# Patient Record
Sex: Male | Born: 1960 | Race: White | Hispanic: No | Marital: Married | Smoking: Never smoker
Health system: Southern US, Community
[De-identification: ages and names within clinical notes are randomized; demographics above are authoritative.]

## PROBLEM LIST (undated history)

## (undated) DIAGNOSIS — R059 Cough, unspecified: Secondary | ICD-10-CM

## (undated) DIAGNOSIS — J309 Allergic rhinitis, unspecified: Secondary | ICD-10-CM

## (undated) DIAGNOSIS — R05 Cough: Secondary | ICD-10-CM

## (undated) DIAGNOSIS — F32A Depression, unspecified: Secondary | ICD-10-CM

## (undated) DIAGNOSIS — E785 Hyperlipidemia, unspecified: Secondary | ICD-10-CM

## (undated) DIAGNOSIS — M5020 Other cervical disc displacement, unspecified cervical region: Secondary | ICD-10-CM

## (undated) DIAGNOSIS — F329 Major depressive disorder, single episode, unspecified: Secondary | ICD-10-CM

## (undated) DIAGNOSIS — R0981 Nasal congestion: Secondary | ICD-10-CM

## (undated) DIAGNOSIS — F4010 Social phobia, unspecified: Secondary | ICD-10-CM

## (undated) HISTORY — DX: Hyperlipidemia, unspecified: E78.5

## (undated) HISTORY — DX: Cough: R05

## (undated) HISTORY — DX: Social phobia, unspecified: F40.10

## (undated) HISTORY — DX: Allergic rhinitis, unspecified: J30.9

## (undated) HISTORY — DX: Cough, unspecified: R05.9

## (undated) HISTORY — DX: Nasal congestion: R09.81

---
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
## 1988-06-26 HISTORY — PX: OTHER SURGICAL HISTORY: SHX169

## 1998-06-26 HISTORY — PX: OTHER SURGICAL HISTORY: SHX169

## 1998-07-22 ENCOUNTER — Ambulatory Visit (HOSPITAL_BASED_OUTPATIENT_CLINIC_OR_DEPARTMENT_OTHER): Admission: RE | Admit: 1998-07-22 | Discharge: 1998-07-22 | Payer: Self-pay | Admitting: Orthopedic Surgery

## 2002-06-05 ENCOUNTER — Ambulatory Visit (HOSPITAL_BASED_OUTPATIENT_CLINIC_OR_DEPARTMENT_OTHER): Admission: RE | Admit: 2002-06-05 | Discharge: 2002-06-05 | Payer: Self-pay | Admitting: Plastic Surgery

## 2011-03-12 ENCOUNTER — Emergency Department (HOSPITAL_COMMUNITY)
Admission: EM | Admit: 2011-03-12 | Discharge: 2011-03-12 | Disposition: A | Discharge status: Home / Self Care | Payer: 59 | Attending: Emergency Medicine | Admitting: Emergency Medicine

## 2011-03-12 DIAGNOSIS — M25519 Pain in unspecified shoulder: Secondary | ICD-10-CM | POA: Insufficient documentation

## 2011-03-12 DIAGNOSIS — R51 Headache: Secondary | ICD-10-CM | POA: Insufficient documentation

## 2011-03-12 LAB — POCT I-STAT TROPONIN I: Troponin i, poc: 0 ng/mL (ref 0.00–0.08)

## 2011-08-21 ENCOUNTER — Ambulatory Visit (INDEPENDENT_AMBULATORY_CARE_PROVIDER_SITE_OTHER): Payer: 59 | Admitting: General Surgery

## 2011-08-21 ENCOUNTER — Encounter (INDEPENDENT_AMBULATORY_CARE_PROVIDER_SITE_OTHER): Payer: Self-pay | Admitting: General Surgery

## 2011-08-21 VITALS — BP 130/72 | HR 70 | Temp 98.1°F | Resp 18 | Ht 71.0 in | Wt 198.0 lb

## 2011-08-21 DIAGNOSIS — R1031 Right lower quadrant pain: Secondary | ICD-10-CM

## 2011-08-21 NOTE — Progress Notes (Signed)
Patient ID: Adam Meyers, male   DOB: 1960/12/11, 51 y.o.   MRN: [id number]  Chief Complaint  Patient presents with  . Follow-up    Inguinal hernia   HPI He was referred by Dr. Bjorn Pippin  for evaluation of right groin pain, suspected inguinal hernia.  The patient has no prior history of hernia. He had epididymitis in the remote past. He had a vasectomy about 5 years ago. He said he says a chronic right scrotal pain since that time.  For the past 2 weeks he's had an increasing amount of pain in his right testicle and right inguinal area. No nausea or vomiting. Denies feeling a bulge pain,o voiding symptoms. One week ago he lifted a heavy box and the pain got worse. The pain bothers him when he walks in the neighborhood. Otherwise there are no aggravating or alleviating factors.  He saw Bjorn Pippin today and he thought that he had a small right inguinal hernia and that he was exquisitely tender. He felt that there was no evidence of epididymitis or  bacterial infection in the urologic tract. HPI  Past Medical History  Diagnosis Date  . Inguinal hernia   . Hyperlipidemia   . Nasal congestion   . Cough     Past Surgical History  Procedure Date  . Tonsillectomy 1970  . Left knee surgery 1990    arthroscopic  . Bone spur removal 2000    right shoulder    Family History  Problem Relation Age of Onset  . Cancer Maternal Grandmother     melanoma    Social History History  Substance Use Topics  . Smoking status: Never Smoker   . Smokeless tobacco: Never Used  . Alcohol Use: No    Allergies  Allergen Reactions  . Percocet (Oxycodone-Acetaminophen) Anxiety    Current Outpatient Prescriptions  Medication Sig Dispense Refill  . acetaminophen (TYLENOL) 500 MG tablet Take 1,000 mg by mouth as needed.      Marland Kitchen azelastine (ASTELIN) 137 MCG/SPRAY nasal spray Place 1 spray into the nose as needed. Use in each nostril as directed      . ibuprofen (ADVIL) 200 MG tablet Take 600 mg by  mouth as needed.      . mometasone (NASONEX) 50 MCG/ACT nasal spray Place 2 sprays into the nose as needed.        Review of Systems Review of Systems  Constitutional: Negative for fever, chills and unexpected weight change.  HENT: Negative for hearing loss, congestion, sore throat, trouble swallowing and voice change.   Eyes: Negative for visual disturbance.  Respiratory: Negative for cough and wheezing.   Cardiovascular: Negative for chest pain, palpitations and leg swelling.  Gastrointestinal: Negative for nausea, vomiting, abdominal pain, diarrhea, constipation, blood in stool, abdominal distention, anal bleeding and rectal pain.  Genitourinary: Positive for testicular pain. Negative for urgency, hematuria, flank pain, decreased urine volume, discharge, penile swelling, scrotal swelling, enuresis, difficulty urinating, genital sores and penile pain.  Musculoskeletal: Negative for arthralgias.  Skin: Negative for rash and wound.  Neurological: Negative for seizures, syncope, weakness and headaches.  Hematological: Negative for adenopathy. Does not bruise/bleed easily.  Psychiatric/Behavioral: Negative for confusion.    Blood pressure 130/72, pulse 70, temperature 98.1 F (36.7 C), temperature source Temporal, resp. rate 18, height 5\' 11"  (1.803 m), weight 198 lb (89.812 kg).  Physical Exam Physical Exam  Constitutional: He is oriented to person, place, and time. He appears well-developed and well-nourished. No distress.  Pleasant. Cooperative. Intelligent. Wife is with him.  HENT:  Head: Normocephalic.  Nose: Nose normal.  Mouth/Throat: No oropharyngeal exudate.  Eyes: Conjunctivae and EOM are normal. Pupils are equal, round, and reactive to light. Right eye exhibits no discharge. Left eye exhibits no discharge. No scleral icterus.  Neck: Normal range of motion. Neck supple. No JVD present. No tracheal deviation present. No thyromegaly present.  Cardiovascular: Normal rate,  regular rhythm, normal heart sounds and intact distal pulses.   No murmur heard. Pulmonary/Chest: Effort normal and breath sounds normal. No stridor. No respiratory distress. He has no wheezes. He has no rales. He exhibits no tenderness.  Abdominal: Soft. Bowel sounds are normal. He exhibits no distension and no mass. There is no tenderness. There is no rebound and no guarding.  Genitourinary: Penis normal. No penile tenderness.       Examined supine and standing. I cannot demonstrate a hernia bulge but he is very tender to examine internally in the inguinal canal. No adenopathy. No skin change. Penis scrotum and testes are normal.  Musculoskeletal: Normal range of motion. He exhibits no edema and no tenderness.  Lymphadenopathy:    He has no cervical adenopathy.  Neurological: He is alert and oriented to person, place, and time. He has normal reflexes. Coordination normal.  Skin: Skin is warm and dry. No rash noted. He is not diaphoretic. No erythema. No pallor.  Psychiatric: He has a normal mood and affect. His behavior is normal. Judgment and thought content normal.    Data Reviewed I had two phone conversations with Dr. Annabell Howells. I reviewed Dr. Belva Crome office notes and lab tests.  Assessment    Right groin pain, recent onset and exacerbation superimposed on chronic right scrotal pain. This is most likely a musculoskeletal strain, although I cannot completely rule out an occult inguinal hernia.  His tenderness is out of proportion to the physical findings, and this suggests a musculoskeletal etiology.  He does not appear to be acutely ill.    Plan    Initially, we are going to treat this as a musculoskeletal strain. No sports or heavy lifting for 3 weeks. Nonsteroidal medication every 12 hours. Ice packs.  Return to see me in 3 weeks for repeat exam to make sure we have not missed a small hernia.  I do not think there is any indication for CT scanning at this time, although if  symptoms persist that may become necessary.   Angelia Mould. Derrell Lolling, M.D., [facility] General and Minimally invasive Surgery Breast and Colorectal Surgery Office:   [phone] Pager:   [phone]         08/21/2011, 4:45 PM

## 2011-08-21 NOTE — Patient Instructions (Signed)
You have pain in your right groin for the past 2 weeks, but I cannot clearly demonstrat a hernia. Dr. Annabell Howells  and I do not believe you have an infection. It is possible you just have a musculoskeletal strain.  I advise you to take 2 Aleve tablets twice a day, and use an ice pack on the right groin intermittently, and stop all heavy lifting, sports or exercise in the gym for 2-3 weeks.  Return to see Dr. Derrell Lolling in 3 weeks to make sure that you have not developed a hernia.

## 2011-08-28 ENCOUNTER — Encounter (INDEPENDENT_AMBULATORY_CARE_PROVIDER_SITE_OTHER): Payer: 59 | Admitting: Surgery

## 2011-09-05 ENCOUNTER — Telehealth (INDEPENDENT_AMBULATORY_CARE_PROVIDER_SITE_OTHER): Payer: Self-pay

## 2011-09-05 NOTE — Telephone Encounter (Signed)
Pt called stating he bent over to pick up soda from floor and had pain run down leg into foot. Pt states this discomfort kept him from sleeping last night. Pt states he does not have bulge or change in groin that he was examined for by Dr Derrell Lolling. Pt has seen an [place] [facility] in resent past. Pt advised to see his orthopedic md to rule out any back injury. Pt states he will.

## 2011-09-12 ENCOUNTER — Ambulatory Visit
Admission: RE | Admit: 2011-09-12 | Discharge: 2011-09-12 | Disposition: A | Discharge status: Home / Self Care | Payer: 59 | Source: Ambulatory Visit | Attending: Family Medicine | Admitting: Family Medicine

## 2011-09-12 ENCOUNTER — Other Ambulatory Visit: Payer: Self-pay | Admitting: Family Medicine

## 2011-09-12 DIAGNOSIS — R1030 Lower abdominal pain, unspecified: Secondary | ICD-10-CM

## 2011-09-12 MED ORDER — IOHEXOL 300 MG/ML  SOLN
100.0000 mL | Freq: Once | INTRAMUSCULAR | Status: AC | PRN
  Administered 2011-09-12: 100 mL via INTRAVENOUS

## 2011-09-13 ENCOUNTER — Other Ambulatory Visit: Payer: Self-pay | Admitting: Family Medicine

## 2011-09-13 DIAGNOSIS — R1030 Lower abdominal pain, unspecified: Secondary | ICD-10-CM

## 2011-09-14 ENCOUNTER — Other Ambulatory Visit: Payer: Self-pay | Admitting: Family Medicine

## 2011-09-14 ENCOUNTER — Encounter (INDEPENDENT_AMBULATORY_CARE_PROVIDER_SITE_OTHER): Payer: 59 | Admitting: General Surgery

## 2011-09-14 DIAGNOSIS — M5416 Radiculopathy, lumbar region: Secondary | ICD-10-CM

## 2011-09-20 ENCOUNTER — Ambulatory Visit
Admission: RE | Admit: 2011-09-20 | Discharge: 2011-09-20 | Disposition: A | Discharge status: Home / Self Care | Payer: 59 | Source: Ambulatory Visit | Attending: Family Medicine | Admitting: Family Medicine

## 2011-09-20 DIAGNOSIS — M5416 Radiculopathy, lumbar region: Secondary | ICD-10-CM

## 2011-10-02 ENCOUNTER — Encounter (INDEPENDENT_AMBULATORY_CARE_PROVIDER_SITE_OTHER): Payer: 59 | Admitting: General Surgery

## 2012-04-15 ENCOUNTER — Other Ambulatory Visit: Payer: Self-pay | Admitting: Family Medicine

## 2012-04-15 DIAGNOSIS — G44009 Cluster headache syndrome, unspecified, not intractable: Secondary | ICD-10-CM

## 2012-04-15 DIAGNOSIS — M542 Cervicalgia: Secondary | ICD-10-CM

## 2012-04-18 ENCOUNTER — Other Ambulatory Visit: Payer: Self-pay | Admitting: Gastroenterology

## 2012-04-18 DIAGNOSIS — R1310 Dysphagia, unspecified: Secondary | ICD-10-CM

## 2012-04-23 ENCOUNTER — Other Ambulatory Visit: Payer: 59

## 2012-04-24 ENCOUNTER — Other Ambulatory Visit: Payer: 59

## 2012-04-27 ENCOUNTER — Other Ambulatory Visit: Payer: 59

## 2012-05-01 ENCOUNTER — Other Ambulatory Visit (HOSPITAL_COMMUNITY): Payer: 59

## 2012-05-02 ENCOUNTER — Ambulatory Visit
Admission: RE | Admit: 2012-05-02 | Discharge: 2012-05-02 | Disposition: A | Discharge status: Home / Self Care | Payer: 59 | Source: Ambulatory Visit | Attending: Gastroenterology | Admitting: Gastroenterology

## 2012-05-02 ENCOUNTER — Inpatient Hospital Stay: Admission: RE | Admit: 2012-05-02 | Payer: 59 | Source: Ambulatory Visit

## 2012-05-02 ENCOUNTER — Other Ambulatory Visit: Payer: 59

## 2012-05-02 DIAGNOSIS — R1310 Dysphagia, unspecified: Secondary | ICD-10-CM

## 2012-05-05 ENCOUNTER — Ambulatory Visit
Admission: RE | Admit: 2012-05-05 | Discharge: 2012-05-05 | Disposition: A | Discharge status: Home / Self Care | Payer: 59 | Source: Ambulatory Visit | Attending: Family Medicine | Admitting: Family Medicine

## 2012-05-05 DIAGNOSIS — IMO0001 Reserved for inherently not codable concepts without codable children: Secondary | ICD-10-CM

## 2012-05-05 DIAGNOSIS — G44009 Cluster headache syndrome, unspecified, not intractable: Secondary | ICD-10-CM

## 2012-05-05 DIAGNOSIS — M542 Cervicalgia: Secondary | ICD-10-CM

## 2012-05-07 ENCOUNTER — Other Ambulatory Visit: Payer: 59

## 2014-08-26 ENCOUNTER — Ambulatory Visit: Payer: Self-pay | Admitting: Podiatry

## 2014-09-21 ENCOUNTER — Ambulatory Visit: Payer: Self-pay | Admitting: Podiatry

## 2014-10-19 ENCOUNTER — Ambulatory Visit (INDEPENDENT_AMBULATORY_CARE_PROVIDER_SITE_OTHER): Payer: 59 | Admitting: Podiatry

## 2014-10-19 DIAGNOSIS — B351 Tinea unguium: Secondary | ICD-10-CM | POA: Diagnosis not present

## 2014-10-19 NOTE — Patient Instructions (Signed)
If the sensitivity increases or moves to additional locations present for further evaluation The left great toenail has deformity with a fungal infection, however, most likely is not the cause of your sensitivity

## 2014-10-19 NOTE — Progress Notes (Signed)
   Subjective:    Patient ID: Adam Meyers, male    DOB: 07/07/1960, 54 y.o.   MRN: [id number]  HPI  N-partial numbness and sensitive on top  L-left great toe D-  2 years ago O-after nail removal  C- same A-pain with the pressure of water hitting it in shower T-none  "left great toe numbness,and very sensitive, feeling of a bandage on it" Patient denies any other areas of local numbness other than the left distal hallux area. The symptoms have not progressed over 2 year.    Patient states that he is recovering from severe depression   Review of Systems  HENT:       Ringing in ears  Eyes: Positive for visual disturbance.  Gastrointestinal: Positive for constipation.  Genitourinary:       Increased urination  Musculoskeletal: Positive for back pain.       Joint pain   Allergic/Immunologic: Positive for environmental allergies.  Psychiatric/Behavioral: The patient is nervous/anxious.        Objective:   Physical Exam  Orientated 3  Vascular: DP and PT pulses 2/4 bilaterally Capillary reflex immediate bilaterally  Neurological: Sensation to 10 g monofilament wire intact 5/5 bilaterally Vibratory sensation intact bilaterally Ankle reflex equal and reactive bilaterally  Dermatological: The left hallux nail has a dystrophic, yellow, hypertrophic nail plate. The remaining toenails have occasional texture and color changes  Musculoskeletal: Manual motor testing: Dorsi flexion, plantar flexion, inversion, eversion 5/5 bilaterally Stable gait There is no restriction ankle, subtalar, midtarsal joints bilaterally      Assessment & Plan:   Assessment: Neurovascular status intact bilaterally There is no evidence of a progressive neurological deficit at this time Mycotic left hallux toenail  Plan: I reviewed the results of the examination with the patient today and made them aware that there was no obvious neurological deficit at this time. The symptoms have not  progressed over 2 year. I advised patient to monitor the symptoms and of the progressed to return for further evaluation and possible referral to a neurologist.  I made patient aware that the left hallux nail was most likely mycotic and made him aware of treatment options including no treatment, debridement, oral medication topical medications  Reappoint at patient's request

## 2015-02-17 DIAGNOSIS — F324 Major depressive disorder, single episode, in partial remission: Secondary | ICD-10-CM | POA: Diagnosis not present

## 2015-03-03 DIAGNOSIS — F324 Major depressive disorder, single episode, in partial remission: Secondary | ICD-10-CM | POA: Diagnosis not present

## 2015-03-12 DIAGNOSIS — F324 Major depressive disorder, single episode, in partial remission: Secondary | ICD-10-CM | POA: Diagnosis not present

## 2015-03-17 DIAGNOSIS — F324 Major depressive disorder, single episode, in partial remission: Secondary | ICD-10-CM | POA: Diagnosis not present

## 2015-03-31 DIAGNOSIS — F324 Major depressive disorder, single episode, in partial remission: Secondary | ICD-10-CM | POA: Diagnosis not present

## 2015-04-15 DIAGNOSIS — F324 Major depressive disorder, single episode, in partial remission: Secondary | ICD-10-CM | POA: Diagnosis not present

## 2015-05-07 DIAGNOSIS — F324 Major depressive disorder, single episode, in partial remission: Secondary | ICD-10-CM | POA: Diagnosis not present

## 2015-05-12 DIAGNOSIS — F324 Major depressive disorder, single episode, in partial remission: Secondary | ICD-10-CM | POA: Diagnosis not present

## 2015-05-26 DIAGNOSIS — F324 Major depressive disorder, single episode, in partial remission: Secondary | ICD-10-CM | POA: Diagnosis not present

## 2015-06-30 DIAGNOSIS — M9901 Segmental and somatic dysfunction of cervical region: Secondary | ICD-10-CM | POA: Diagnosis not present

## 2015-07-02 DIAGNOSIS — F324 Major depressive disorder, single episode, in partial remission: Secondary | ICD-10-CM | POA: Diagnosis not present

## 2015-07-06 DIAGNOSIS — M9901 Segmental and somatic dysfunction of cervical region: Secondary | ICD-10-CM | POA: Diagnosis not present

## 2015-07-07 DIAGNOSIS — M9901 Segmental and somatic dysfunction of cervical region: Secondary | ICD-10-CM | POA: Diagnosis not present

## 2015-07-09 DIAGNOSIS — F324 Major depressive disorder, single episode, in partial remission: Secondary | ICD-10-CM | POA: Diagnosis not present

## 2015-07-10 DIAGNOSIS — M9901 Segmental and somatic dysfunction of cervical region: Secondary | ICD-10-CM | POA: Diagnosis not present

## 2015-07-14 DIAGNOSIS — M9901 Segmental and somatic dysfunction of cervical region: Secondary | ICD-10-CM | POA: Diagnosis not present

## 2015-07-17 DIAGNOSIS — M9901 Segmental and somatic dysfunction of cervical region: Secondary | ICD-10-CM | POA: Diagnosis not present

## 2015-07-19 DIAGNOSIS — M9901 Segmental and somatic dysfunction of cervical region: Secondary | ICD-10-CM | POA: Diagnosis not present

## 2015-07-20 DIAGNOSIS — M9901 Segmental and somatic dysfunction of cervical region: Secondary | ICD-10-CM | POA: Diagnosis not present

## 2015-07-24 DIAGNOSIS — M9901 Segmental and somatic dysfunction of cervical region: Secondary | ICD-10-CM | POA: Diagnosis not present

## 2015-07-28 DIAGNOSIS — M9901 Segmental and somatic dysfunction of cervical region: Secondary | ICD-10-CM | POA: Diagnosis not present

## 2015-07-29 DIAGNOSIS — F324 Major depressive disorder, single episode, in partial remission: Secondary | ICD-10-CM | POA: Diagnosis not present

## 2015-07-29 DIAGNOSIS — M9901 Segmental and somatic dysfunction of cervical region: Secondary | ICD-10-CM | POA: Diagnosis not present

## 2015-08-01 DIAGNOSIS — M9901 Segmental and somatic dysfunction of cervical region: Secondary | ICD-10-CM | POA: Diagnosis not present

## 2015-08-04 DIAGNOSIS — M9901 Segmental and somatic dysfunction of cervical region: Secondary | ICD-10-CM | POA: Diagnosis not present

## 2015-08-07 DIAGNOSIS — M9901 Segmental and somatic dysfunction of cervical region: Secondary | ICD-10-CM | POA: Diagnosis not present

## 2015-08-08 DIAGNOSIS — M9901 Segmental and somatic dysfunction of cervical region: Secondary | ICD-10-CM | POA: Diagnosis not present

## 2015-08-09 DIAGNOSIS — M9901 Segmental and somatic dysfunction of cervical region: Secondary | ICD-10-CM | POA: Diagnosis not present

## 2015-08-10 DIAGNOSIS — F324 Major depressive disorder, single episode, in partial remission: Secondary | ICD-10-CM | POA: Diagnosis not present

## 2015-08-11 DIAGNOSIS — F324 Major depressive disorder, single episode, in partial remission: Secondary | ICD-10-CM | POA: Diagnosis not present

## 2015-08-11 DIAGNOSIS — M9901 Segmental and somatic dysfunction of cervical region: Secondary | ICD-10-CM | POA: Diagnosis not present

## 2015-08-13 DIAGNOSIS — M9901 Segmental and somatic dysfunction of cervical region: Secondary | ICD-10-CM | POA: Diagnosis not present

## 2015-08-15 DIAGNOSIS — M9901 Segmental and somatic dysfunction of cervical region: Secondary | ICD-10-CM | POA: Diagnosis not present

## 2015-08-18 DIAGNOSIS — M9901 Segmental and somatic dysfunction of cervical region: Secondary | ICD-10-CM | POA: Diagnosis not present

## 2015-08-20 DIAGNOSIS — M9901 Segmental and somatic dysfunction of cervical region: Secondary | ICD-10-CM | POA: Diagnosis not present

## 2015-08-22 DIAGNOSIS — M9901 Segmental and somatic dysfunction of cervical region: Secondary | ICD-10-CM | POA: Diagnosis not present

## 2015-08-23 DIAGNOSIS — F324 Major depressive disorder, single episode, in partial remission: Secondary | ICD-10-CM | POA: Diagnosis not present

## 2015-08-25 DIAGNOSIS — M9901 Segmental and somatic dysfunction of cervical region: Secondary | ICD-10-CM | POA: Diagnosis not present

## 2015-08-25 DIAGNOSIS — R0789 Other chest pain: Secondary | ICD-10-CM | POA: Diagnosis not present

## 2015-08-25 DIAGNOSIS — R5383 Other fatigue: Secondary | ICD-10-CM | POA: Diagnosis not present

## 2015-08-26 DIAGNOSIS — M9901 Segmental and somatic dysfunction of cervical region: Secondary | ICD-10-CM | POA: Diagnosis not present

## 2015-08-27 DIAGNOSIS — M9901 Segmental and somatic dysfunction of cervical region: Secondary | ICD-10-CM | POA: Diagnosis not present

## 2015-08-30 DIAGNOSIS — M9901 Segmental and somatic dysfunction of cervical region: Secondary | ICD-10-CM | POA: Diagnosis not present

## 2015-08-31 DIAGNOSIS — M9901 Segmental and somatic dysfunction of cervical region: Secondary | ICD-10-CM | POA: Diagnosis not present

## 2015-09-01 DIAGNOSIS — F324 Major depressive disorder, single episode, in partial remission: Secondary | ICD-10-CM | POA: Diagnosis not present

## 2015-09-08 DIAGNOSIS — M9901 Segmental and somatic dysfunction of cervical region: Secondary | ICD-10-CM | POA: Diagnosis not present

## 2015-09-22 DIAGNOSIS — F324 Major depressive disorder, single episode, in partial remission: Secondary | ICD-10-CM | POA: Diagnosis not present

## 2015-09-22 DIAGNOSIS — M9901 Segmental and somatic dysfunction of cervical region: Secondary | ICD-10-CM | POA: Diagnosis not present

## 2015-09-24 DIAGNOSIS — M9901 Segmental and somatic dysfunction of cervical region: Secondary | ICD-10-CM | POA: Diagnosis not present

## 2015-09-30 DIAGNOSIS — F324 Major depressive disorder, single episode, in partial remission: Secondary | ICD-10-CM | POA: Diagnosis not present

## 2015-10-06 DIAGNOSIS — M9901 Segmental and somatic dysfunction of cervical region: Secondary | ICD-10-CM | POA: Diagnosis not present

## 2015-10-13 DIAGNOSIS — F324 Major depressive disorder, single episode, in partial remission: Secondary | ICD-10-CM | POA: Diagnosis not present

## 2015-10-20 DIAGNOSIS — M9901 Segmental and somatic dysfunction of cervical region: Secondary | ICD-10-CM | POA: Diagnosis not present

## 2015-10-28 DIAGNOSIS — Z0100 Encounter for examination of eyes and vision without abnormal findings: Secondary | ICD-10-CM | POA: Diagnosis not present

## 2015-11-03 DIAGNOSIS — F324 Major depressive disorder, single episode, in partial remission: Secondary | ICD-10-CM | POA: Diagnosis not present

## 2015-11-04 DIAGNOSIS — M9901 Segmental and somatic dysfunction of cervical region: Secondary | ICD-10-CM | POA: Diagnosis not present

## 2015-11-17 DIAGNOSIS — M9901 Segmental and somatic dysfunction of cervical region: Secondary | ICD-10-CM | POA: Diagnosis not present

## 2015-11-24 DIAGNOSIS — F324 Major depressive disorder, single episode, in partial remission: Secondary | ICD-10-CM | POA: Diagnosis not present

## 2015-11-25 DIAGNOSIS — F324 Major depressive disorder, single episode, in partial remission: Secondary | ICD-10-CM | POA: Diagnosis not present

## 2015-12-15 DIAGNOSIS — F324 Major depressive disorder, single episode, in partial remission: Secondary | ICD-10-CM | POA: Diagnosis not present

## 2016-01-05 DIAGNOSIS — F324 Major depressive disorder, single episode, in partial remission: Secondary | ICD-10-CM | POA: Diagnosis not present

## 2016-01-20 DIAGNOSIS — F324 Major depressive disorder, single episode, in partial remission: Secondary | ICD-10-CM | POA: Diagnosis not present

## 2016-01-25 DIAGNOSIS — F324 Major depressive disorder, single episode, in partial remission: Secondary | ICD-10-CM | POA: Diagnosis not present

## 2016-02-16 DIAGNOSIS — F324 Major depressive disorder, single episode, in partial remission: Secondary | ICD-10-CM | POA: Diagnosis not present

## 2016-03-08 DIAGNOSIS — F324 Major depressive disorder, single episode, in partial remission: Secondary | ICD-10-CM | POA: Diagnosis not present

## 2016-03-24 DIAGNOSIS — F324 Major depressive disorder, single episode, in partial remission: Secondary | ICD-10-CM | POA: Diagnosis not present

## 2016-03-29 DIAGNOSIS — F324 Major depressive disorder, single episode, in partial remission: Secondary | ICD-10-CM | POA: Diagnosis not present

## 2016-04-16 ENCOUNTER — Emergency Department (HOSPITAL_COMMUNITY)
Admission: EM | Admit: 2016-04-16 | Discharge: 2016-04-16 | Disposition: A | Discharge status: Home / Self Care | Payer: Commercial Managed Care - HMO | Attending: Emergency Medicine | Admitting: Emergency Medicine

## 2016-04-16 ENCOUNTER — Emergency Department (HOSPITAL_COMMUNITY): Payer: Commercial Managed Care - HMO

## 2016-04-16 ENCOUNTER — Encounter (HOSPITAL_COMMUNITY): Payer: Self-pay

## 2016-04-16 DIAGNOSIS — S199XXA Unspecified injury of neck, initial encounter: Secondary | ICD-10-CM | POA: Diagnosis not present

## 2016-04-16 DIAGNOSIS — S098XXA Other specified injuries of head, initial encounter: Secondary | ICD-10-CM | POA: Diagnosis not present

## 2016-04-16 DIAGNOSIS — Y9222 Religious institution as the place of occurrence of the external cause: Secondary | ICD-10-CM | POA: Insufficient documentation

## 2016-04-16 DIAGNOSIS — R220 Localized swelling, mass and lump, head: Secondary | ICD-10-CM | POA: Diagnosis not present

## 2016-04-16 DIAGNOSIS — S79911A Unspecified injury of right hip, initial encounter: Secondary | ICD-10-CM | POA: Diagnosis not present

## 2016-04-16 DIAGNOSIS — W010XXA Fall on same level from slipping, tripping and stumbling without subsequent striking against object, initial encounter: Secondary | ICD-10-CM | POA: Insufficient documentation

## 2016-04-16 DIAGNOSIS — M25511 Pain in right shoulder: Secondary | ICD-10-CM | POA: Diagnosis not present

## 2016-04-16 DIAGNOSIS — S60519A Abrasion of unspecified hand, initial encounter: Secondary | ICD-10-CM | POA: Diagnosis not present

## 2016-04-16 DIAGNOSIS — S80219A Abrasion, unspecified knee, initial encounter: Secondary | ICD-10-CM | POA: Insufficient documentation

## 2016-04-16 DIAGNOSIS — S80211A Abrasion, right knee, initial encounter: Secondary | ICD-10-CM | POA: Diagnosis not present

## 2016-04-16 DIAGNOSIS — Y939 Activity, unspecified: Secondary | ICD-10-CM | POA: Insufficient documentation

## 2016-04-16 DIAGNOSIS — T148XXA Other injury of unspecified body region, initial encounter: Secondary | ICD-10-CM | POA: Diagnosis present

## 2016-04-16 DIAGNOSIS — S0511XA Contusion of eyeball and orbital tissues, right eye, initial encounter: Secondary | ICD-10-CM

## 2016-04-16 DIAGNOSIS — Y999 Unspecified external cause status: Secondary | ICD-10-CM | POA: Insufficient documentation

## 2016-04-16 DIAGNOSIS — S0591XA Unspecified injury of right eye and orbit, initial encounter: Secondary | ICD-10-CM | POA: Diagnosis present

## 2016-04-16 DIAGNOSIS — S0091XA Abrasion of unspecified part of head, initial encounter: Secondary | ICD-10-CM | POA: Diagnosis not present

## 2016-04-16 DIAGNOSIS — W19XXXA Unspecified fall, initial encounter: Secondary | ICD-10-CM

## 2016-04-16 DIAGNOSIS — S0993XA Unspecified injury of face, initial encounter: Secondary | ICD-10-CM | POA: Diagnosis not present

## 2016-04-16 HISTORY — DX: Other cervical disc displacement, unspecified cervical region: M50.20

## 2016-04-16 HISTORY — DX: Depression, unspecified: F32.A

## 2016-04-16 HISTORY — DX: Major depressive disorder, single episode, unspecified: F32.9

## 2016-04-16 MED ORDER — TETANUS-DIPHTHERIA TOXOIDS TD 5-2 LFU IM INJ
0.5000 mL | INJECTION | Freq: Once | INTRAMUSCULAR | Status: AC
  Administered 2016-04-16: 0.5 mL via INTRAMUSCULAR
  Filled 2016-04-16: qty 0.5

## 2016-04-16 MED ORDER — BACITRACIN ZINC 500 UNIT/GM EX OINT
TOPICAL_OINTMENT | CUTANEOUS | Status: AC
  Administered 2016-04-16: 16:00:00
  Filled 2016-04-16: qty 1.8

## 2016-04-16 MED ORDER — IBUPROFEN 200 MG PO TABS
600.0000 mg | ORAL_TABLET | Freq: Once | ORAL | Status: AC
  Administered 2016-04-16: 600 mg via ORAL
  Filled 2016-04-16: qty 3

## 2016-04-16 NOTE — ED Notes (Signed)
Bed: [id number] Expected date: 04/16/16 Expected time: 11:19 AM Means of arrival: Ambulance Comments: abd pain

## 2016-04-16 NOTE — ED Notes (Signed)
Delay in vitals, pt in xray

## 2016-04-16 NOTE — ED Notes (Signed)
Patient transported to X-ray 

## 2016-04-16 NOTE — ED Triage Notes (Signed)
Per EMS, pt was at church and fell.  Shoulder pain,  Hip pain, abrasions to hand and knee. Pt tripped over concrete.  No LOC.  No head injury.  A/O x 4.  No blood thinners.  Vitals 130/80, hr 78, resp 18, 99% ra, cbg 92

## 2016-04-26 DIAGNOSIS — F324 Major depressive disorder, single episode, in partial remission: Secondary | ICD-10-CM | POA: Diagnosis not present

## 2016-05-04 DIAGNOSIS — F324 Major depressive disorder, single episode, in partial remission: Secondary | ICD-10-CM | POA: Diagnosis not present

## 2016-05-16 DIAGNOSIS — M25561 Pain in right knee: Secondary | ICD-10-CM | POA: Diagnosis not present

## 2016-05-23 DIAGNOSIS — F324 Major depressive disorder, single episode, in partial remission: Secondary | ICD-10-CM | POA: Diagnosis not present

## 2016-05-24 DIAGNOSIS — F324 Major depressive disorder, single episode, in partial remission: Secondary | ICD-10-CM | POA: Diagnosis not present

## 2016-06-03 NOTE — ED Provider Notes (Signed)
[facility]-EMERGENCY DEPT Provider Note   CSN: [id number] Arrival date & time: 04/16/16  1158     History   Chief Complaint Chief Complaint  Patient presents with  . Shoulder Pain    HPI Adam Meyers is a 55 y.o. male.  HPI Per EMS, pt was at church and fell.  Shoulder pain,  Hip pain, abrasions to hand and knee. Pt tripped over concrete.  No LOC.  No head injury.  A/O x 4.  No blood thinners.  Vitals 130/80, hr 78, resp 18, 99% ra, cbg 92 Past Medical History:  Diagnosis Date  . Compressed cervical disc   . Cough   . Depression   . Hyperlipidemia   . Nasal congestion     Patient Active Problem List   Diagnosis Date Noted  . Abrasion 04/16/2016    Past Surgical History:  Procedure Laterality Date  . bone spur removal  2000   right shoulder  . left knee surgery  1990   arthroscopic  . TONSILLECTOMY  1970       Home Medications    Prior to Admission medications   Medication Sig Start Date End Date Taking? Authorizing Provider  diazepam (VALIUM) 5 MG tablet Take 2.5 mg by mouth at bedtime. At night    Yes Historical Provider, MD  famotidine (PEPCID) 20 MG tablet Take 20 mg by mouth daily as needed for heartburn or indigestion.   Yes Historical Provider, MD  fluvoxaMINE (LUVOX) 100 MG tablet Take 50-100 mg by mouth 2 (two) times daily. 50 mg at lunch and 100 mg at bedtime   Yes Historical Provider, MD  ibuprofen (ADVIL) 200 MG tablet Take 600 mg by mouth every 8 (eight) hours as needed for moderate pain.    Yes Historical Provider, MD    Family History Family History  Problem Relation Age of Onset  . Cancer Maternal Grandmother     melanoma    Social History Social History  Substance Use Topics  . Smoking status: Never Smoker  . Smokeless tobacco: Never Used  . Alcohol use No     Allergies   Oxycodone-acetaminophen; Seasonal ic [cholestatin]; Gabapentin; and Percocet [oxycodone-acetaminophen]   Review of Systems Review of Systems  All other  systems reviewed and are negative.    Physical Exam Updated Vital Signs BP 124/100 (BP Location: Left Arm)   Pulse 60   Temp 97.5 F (36.4 C) (Oral)   Resp 18   SpO2 98%   Physical Exam  Constitutional: He is oriented to person, place, and time. He appears well-developed and well-nourished. No distress.  HENT:  Head: Normocephalic and atraumatic.  Eyes: Pupils are equal, round, and reactive to light.  Neck: Normal range of motion.  Cardiovascular: Normal rate and intact distal pulses.   Pulmonary/Chest: No respiratory distress.  Abdominal: Normal appearance. He exhibits no distension.  Musculoskeletal: He exhibits tenderness.       Right shoulder: He exhibits decreased range of motion and tenderness.       Arms: Neurological: He is alert and oriented to person, place, and time. No cranial nerve deficit.  Skin: Skin is warm and dry. No rash noted.  Psychiatric: He has a normal mood and affect. His behavior is normal.  Nursing note and vitals reviewed.    ED Treatments / Results  Labs (all labs ordered are listed, but only abnormal results are displayed) Labs Reviewed - No data to display  EKG  EKG Interpretation None  Radiology No results found. Results for orders placed or performed during the hospital encounter of 03/12/11  POCT i-Stat troponin I  Result Value Ref Range   Troponin i, poc 0.00 0.00 - 0.08 ng/mL   Comment 3           No results found. FINDINGS: There is no evidence of fracture or dislocation. There is no evidence of arthropathy or other focal bone abnormality. Soft tissues are unremarkable.  IMPRESSION: Negative.   Electronically Signed   By: Norva PavlovElizabeth  Brown M.D.   On: 04/16/2016 14:35 Procedures Procedures (including critical care time)  Medications Ordered in ED Medications  ibuprofen (ADVIL,MOTRIN) tablet 600 mg (600 mg Oral Given 04/16/16 1434)  tetanus & diphtheria toxoids (adult) (TENIVAC) injection 0.5 mL (0.5 mLs  Intramuscular Given 04/16/16 1530)  bacitracin 500 UNIT/GM ointment (  Given 04/16/16 1532)     Initial Impression / Assessment and Plan / ED Course  I have reviewed the triage vital signs and the nursing notes.  Pertinent labs & imaging results that were available during my care of the patient were reviewed by me and considered in my medical decision making (see chart for details).  Clinical Course       Final Clinical Impressions(s) / ED Diagnoses   Final diagnoses:  Fall, initial encounter  Abrasion  Contusion of right orbital tissues, initial encounter    New Prescriptions Discharge Medication List as of 04/16/2016  3:03 PM       Nelva Nayobert Chuckie Mccathern, MD 06/03/16 1524

## 2016-06-15 DIAGNOSIS — F324 Major depressive disorder, single episode, in partial remission: Secondary | ICD-10-CM | POA: Diagnosis not present

## 2016-07-06 DIAGNOSIS — F324 Major depressive disorder, single episode, in partial remission: Secondary | ICD-10-CM | POA: Diagnosis not present

## 2016-07-26 DIAGNOSIS — F324 Major depressive disorder, single episode, in partial remission: Secondary | ICD-10-CM | POA: Diagnosis not present

## 2016-08-01 DIAGNOSIS — F324 Major depressive disorder, single episode, in partial remission: Secondary | ICD-10-CM | POA: Diagnosis not present

## 2016-08-16 DIAGNOSIS — F324 Major depressive disorder, single episode, in partial remission: Secondary | ICD-10-CM | POA: Diagnosis not present

## 2016-09-06 DIAGNOSIS — F324 Major depressive disorder, single episode, in partial remission: Secondary | ICD-10-CM | POA: Diagnosis not present

## 2016-09-13 DIAGNOSIS — Z125 Encounter for screening for malignant neoplasm of prostate: Secondary | ICD-10-CM | POA: Diagnosis not present

## 2016-09-13 DIAGNOSIS — M542 Cervicalgia: Secondary | ICD-10-CM | POA: Diagnosis not present

## 2016-09-13 DIAGNOSIS — Z8639 Personal history of other endocrine, nutritional and metabolic disease: Secondary | ICD-10-CM | POA: Diagnosis not present

## 2016-09-13 DIAGNOSIS — E782 Mixed hyperlipidemia: Secondary | ICD-10-CM | POA: Diagnosis not present

## 2016-09-13 DIAGNOSIS — M2669 Other specified disorders of temporomandibular joint: Secondary | ICD-10-CM | POA: Diagnosis not present

## 2016-09-13 DIAGNOSIS — Z79899 Other long term (current) drug therapy: Secondary | ICD-10-CM | POA: Diagnosis not present

## 2016-09-13 DIAGNOSIS — F419 Anxiety disorder, unspecified: Secondary | ICD-10-CM | POA: Diagnosis not present

## 2016-09-14 DIAGNOSIS — Z125 Encounter for screening for malignant neoplasm of prostate: Secondary | ICD-10-CM | POA: Diagnosis not present

## 2016-09-14 DIAGNOSIS — E782 Mixed hyperlipidemia: Secondary | ICD-10-CM | POA: Diagnosis not present

## 2016-09-14 DIAGNOSIS — Z79899 Other long term (current) drug therapy: Secondary | ICD-10-CM | POA: Diagnosis not present

## 2016-09-28 DIAGNOSIS — F324 Major depressive disorder, single episode, in partial remission: Secondary | ICD-10-CM | POA: Diagnosis not present

## 2016-10-03 DIAGNOSIS — M545 Low back pain: Secondary | ICD-10-CM | POA: Diagnosis not present

## 2016-10-03 DIAGNOSIS — M542 Cervicalgia: Secondary | ICD-10-CM | POA: Diagnosis not present

## 2016-10-03 DIAGNOSIS — F4542 Pain disorder with related psychological factors: Secondary | ICD-10-CM | POA: Diagnosis not present

## 2016-10-03 DIAGNOSIS — M5417 Radiculopathy, lumbosacral region: Secondary | ICD-10-CM | POA: Diagnosis not present

## 2016-10-04 DIAGNOSIS — M542 Cervicalgia: Secondary | ICD-10-CM | POA: Diagnosis not present

## 2016-10-04 DIAGNOSIS — F4542 Pain disorder with related psychological factors: Secondary | ICD-10-CM | POA: Diagnosis not present

## 2016-10-04 DIAGNOSIS — M5417 Radiculopathy, lumbosacral region: Secondary | ICD-10-CM | POA: Diagnosis not present

## 2016-10-04 DIAGNOSIS — M545 Low back pain: Secondary | ICD-10-CM | POA: Diagnosis not present

## 2016-10-09 DIAGNOSIS — M542 Cervicalgia: Secondary | ICD-10-CM | POA: Diagnosis not present

## 2016-10-09 DIAGNOSIS — M5417 Radiculopathy, lumbosacral region: Secondary | ICD-10-CM | POA: Diagnosis not present

## 2016-10-09 DIAGNOSIS — M545 Low back pain: Secondary | ICD-10-CM | POA: Diagnosis not present

## 2016-10-09 DIAGNOSIS — F4542 Pain disorder with related psychological factors: Secondary | ICD-10-CM | POA: Diagnosis not present

## 2016-10-11 DIAGNOSIS — M5417 Radiculopathy, lumbosacral region: Secondary | ICD-10-CM | POA: Diagnosis not present

## 2016-10-11 DIAGNOSIS — F4542 Pain disorder with related psychological factors: Secondary | ICD-10-CM | POA: Diagnosis not present

## 2016-10-11 DIAGNOSIS — M542 Cervicalgia: Secondary | ICD-10-CM | POA: Diagnosis not present

## 2016-10-11 DIAGNOSIS — M545 Low back pain: Secondary | ICD-10-CM | POA: Diagnosis not present

## 2016-10-13 DIAGNOSIS — M5417 Radiculopathy, lumbosacral region: Secondary | ICD-10-CM | POA: Diagnosis not present

## 2016-10-13 DIAGNOSIS — F4542 Pain disorder with related psychological factors: Secondary | ICD-10-CM | POA: Diagnosis not present

## 2016-10-13 DIAGNOSIS — M545 Low back pain: Secondary | ICD-10-CM | POA: Diagnosis not present

## 2016-10-13 DIAGNOSIS — M542 Cervicalgia: Secondary | ICD-10-CM | POA: Diagnosis not present

## 2016-10-16 DIAGNOSIS — M542 Cervicalgia: Secondary | ICD-10-CM | POA: Diagnosis not present

## 2016-10-16 DIAGNOSIS — M5417 Radiculopathy, lumbosacral region: Secondary | ICD-10-CM | POA: Diagnosis not present

## 2016-10-16 DIAGNOSIS — M545 Low back pain: Secondary | ICD-10-CM | POA: Diagnosis not present

## 2016-10-16 DIAGNOSIS — F4542 Pain disorder with related psychological factors: Secondary | ICD-10-CM | POA: Diagnosis not present

## 2016-10-17 DIAGNOSIS — M545 Low back pain: Secondary | ICD-10-CM | POA: Diagnosis not present

## 2016-10-17 DIAGNOSIS — M5417 Radiculopathy, lumbosacral region: Secondary | ICD-10-CM | POA: Diagnosis not present

## 2016-10-17 DIAGNOSIS — F4542 Pain disorder with related psychological factors: Secondary | ICD-10-CM | POA: Diagnosis not present

## 2016-10-17 DIAGNOSIS — M542 Cervicalgia: Secondary | ICD-10-CM | POA: Diagnosis not present

## 2016-10-20 DIAGNOSIS — F324 Major depressive disorder, single episode, in partial remission: Secondary | ICD-10-CM | POA: Diagnosis not present

## 2016-10-23 DIAGNOSIS — M545 Low back pain: Secondary | ICD-10-CM | POA: Diagnosis not present

## 2016-10-23 DIAGNOSIS — M5417 Radiculopathy, lumbosacral region: Secondary | ICD-10-CM | POA: Diagnosis not present

## 2016-10-23 DIAGNOSIS — M542 Cervicalgia: Secondary | ICD-10-CM | POA: Diagnosis not present

## 2016-10-23 DIAGNOSIS — F4542 Pain disorder with related psychological factors: Secondary | ICD-10-CM | POA: Diagnosis not present

## 2016-10-24 DIAGNOSIS — M5417 Radiculopathy, lumbosacral region: Secondary | ICD-10-CM | POA: Diagnosis not present

## 2016-10-24 DIAGNOSIS — M545 Low back pain: Secondary | ICD-10-CM | POA: Diagnosis not present

## 2016-10-24 DIAGNOSIS — F4542 Pain disorder with related psychological factors: Secondary | ICD-10-CM | POA: Diagnosis not present

## 2016-10-24 DIAGNOSIS — M542 Cervicalgia: Secondary | ICD-10-CM | POA: Diagnosis not present

## 2016-10-26 DIAGNOSIS — M5417 Radiculopathy, lumbosacral region: Secondary | ICD-10-CM | POA: Diagnosis not present

## 2016-10-26 DIAGNOSIS — F4542 Pain disorder with related psychological factors: Secondary | ICD-10-CM | POA: Diagnosis not present

## 2016-10-26 DIAGNOSIS — M542 Cervicalgia: Secondary | ICD-10-CM | POA: Diagnosis not present

## 2016-10-26 DIAGNOSIS — M545 Low back pain: Secondary | ICD-10-CM | POA: Diagnosis not present

## 2016-10-30 DIAGNOSIS — F4542 Pain disorder with related psychological factors: Secondary | ICD-10-CM | POA: Diagnosis not present

## 2016-10-30 DIAGNOSIS — M545 Low back pain: Secondary | ICD-10-CM | POA: Diagnosis not present

## 2016-10-30 DIAGNOSIS — M5417 Radiculopathy, lumbosacral region: Secondary | ICD-10-CM | POA: Diagnosis not present

## 2016-10-30 DIAGNOSIS — M542 Cervicalgia: Secondary | ICD-10-CM | POA: Diagnosis not present

## 2016-10-31 DIAGNOSIS — M5417 Radiculopathy, lumbosacral region: Secondary | ICD-10-CM | POA: Diagnosis not present

## 2016-10-31 DIAGNOSIS — M545 Low back pain: Secondary | ICD-10-CM | POA: Diagnosis not present

## 2016-10-31 DIAGNOSIS — M542 Cervicalgia: Secondary | ICD-10-CM | POA: Diagnosis not present

## 2016-10-31 DIAGNOSIS — F4542 Pain disorder with related psychological factors: Secondary | ICD-10-CM | POA: Diagnosis not present

## 2016-11-02 DIAGNOSIS — M542 Cervicalgia: Secondary | ICD-10-CM | POA: Diagnosis not present

## 2016-11-02 DIAGNOSIS — M5417 Radiculopathy, lumbosacral region: Secondary | ICD-10-CM | POA: Diagnosis not present

## 2016-11-02 DIAGNOSIS — F4542 Pain disorder with related psychological factors: Secondary | ICD-10-CM | POA: Diagnosis not present

## 2016-11-02 DIAGNOSIS — F324 Major depressive disorder, single episode, in partial remission: Secondary | ICD-10-CM | POA: Diagnosis not present

## 2016-11-02 DIAGNOSIS — M545 Low back pain: Secondary | ICD-10-CM | POA: Diagnosis not present

## 2016-11-07 DIAGNOSIS — M545 Low back pain: Secondary | ICD-10-CM | POA: Diagnosis not present

## 2016-11-07 DIAGNOSIS — M5417 Radiculopathy, lumbosacral region: Secondary | ICD-10-CM | POA: Diagnosis not present

## 2016-11-07 DIAGNOSIS — M542 Cervicalgia: Secondary | ICD-10-CM | POA: Diagnosis not present

## 2016-11-07 DIAGNOSIS — F4542 Pain disorder with related psychological factors: Secondary | ICD-10-CM | POA: Diagnosis not present

## 2016-11-09 DIAGNOSIS — F4542 Pain disorder with related psychological factors: Secondary | ICD-10-CM | POA: Diagnosis not present

## 2016-11-09 DIAGNOSIS — M545 Low back pain: Secondary | ICD-10-CM | POA: Diagnosis not present

## 2016-11-09 DIAGNOSIS — F324 Major depressive disorder, single episode, in partial remission: Secondary | ICD-10-CM | POA: Diagnosis not present

## 2016-11-09 DIAGNOSIS — M5417 Radiculopathy, lumbosacral region: Secondary | ICD-10-CM | POA: Diagnosis not present

## 2016-11-09 DIAGNOSIS — M542 Cervicalgia: Secondary | ICD-10-CM | POA: Diagnosis not present

## 2016-11-10 DIAGNOSIS — M545 Low back pain: Secondary | ICD-10-CM | POA: Diagnosis not present

## 2016-11-10 DIAGNOSIS — M5417 Radiculopathy, lumbosacral region: Secondary | ICD-10-CM | POA: Diagnosis not present

## 2016-11-10 DIAGNOSIS — M542 Cervicalgia: Secondary | ICD-10-CM | POA: Diagnosis not present

## 2016-11-10 DIAGNOSIS — F4542 Pain disorder with related psychological factors: Secondary | ICD-10-CM | POA: Diagnosis not present

## 2016-11-13 DIAGNOSIS — M5417 Radiculopathy, lumbosacral region: Secondary | ICD-10-CM | POA: Diagnosis not present

## 2016-11-13 DIAGNOSIS — M542 Cervicalgia: Secondary | ICD-10-CM | POA: Diagnosis not present

## 2016-11-13 DIAGNOSIS — F4542 Pain disorder with related psychological factors: Secondary | ICD-10-CM | POA: Diagnosis not present

## 2016-11-13 DIAGNOSIS — M545 Low back pain: Secondary | ICD-10-CM | POA: Diagnosis not present

## 2016-11-16 DIAGNOSIS — F4542 Pain disorder with related psychological factors: Secondary | ICD-10-CM | POA: Diagnosis not present

## 2016-11-16 DIAGNOSIS — M5417 Radiculopathy, lumbosacral region: Secondary | ICD-10-CM | POA: Diagnosis not present

## 2016-11-16 DIAGNOSIS — M545 Low back pain: Secondary | ICD-10-CM | POA: Diagnosis not present

## 2016-11-16 DIAGNOSIS — M542 Cervicalgia: Secondary | ICD-10-CM | POA: Diagnosis not present

## 2016-11-21 DIAGNOSIS — M545 Low back pain: Secondary | ICD-10-CM | POA: Diagnosis not present

## 2016-11-21 DIAGNOSIS — M542 Cervicalgia: Secondary | ICD-10-CM | POA: Diagnosis not present

## 2016-11-21 DIAGNOSIS — F4542 Pain disorder with related psychological factors: Secondary | ICD-10-CM | POA: Diagnosis not present

## 2016-11-21 DIAGNOSIS — M5417 Radiculopathy, lumbosacral region: Secondary | ICD-10-CM | POA: Diagnosis not present

## 2016-11-22 DIAGNOSIS — F324 Major depressive disorder, single episode, in partial remission: Secondary | ICD-10-CM | POA: Diagnosis not present

## 2016-11-23 DIAGNOSIS — M542 Cervicalgia: Secondary | ICD-10-CM | POA: Diagnosis not present

## 2016-11-23 DIAGNOSIS — M545 Low back pain: Secondary | ICD-10-CM | POA: Diagnosis not present

## 2016-11-23 DIAGNOSIS — M5417 Radiculopathy, lumbosacral region: Secondary | ICD-10-CM | POA: Diagnosis not present

## 2016-11-23 DIAGNOSIS — F4542 Pain disorder with related psychological factors: Secondary | ICD-10-CM | POA: Diagnosis not present

## 2016-11-27 DIAGNOSIS — M5417 Radiculopathy, lumbosacral region: Secondary | ICD-10-CM | POA: Diagnosis not present

## 2016-11-27 DIAGNOSIS — F4542 Pain disorder with related psychological factors: Secondary | ICD-10-CM | POA: Diagnosis not present

## 2016-11-27 DIAGNOSIS — M542 Cervicalgia: Secondary | ICD-10-CM | POA: Diagnosis not present

## 2016-11-27 DIAGNOSIS — M545 Low back pain: Secondary | ICD-10-CM | POA: Diagnosis not present

## 2016-11-30 DIAGNOSIS — M5417 Radiculopathy, lumbosacral region: Secondary | ICD-10-CM | POA: Diagnosis not present

## 2016-11-30 DIAGNOSIS — M545 Low back pain: Secondary | ICD-10-CM | POA: Diagnosis not present

## 2016-11-30 DIAGNOSIS — M542 Cervicalgia: Secondary | ICD-10-CM | POA: Diagnosis not present

## 2016-11-30 DIAGNOSIS — F4542 Pain disorder with related psychological factors: Secondary | ICD-10-CM | POA: Diagnosis not present

## 2016-12-01 DIAGNOSIS — M545 Low back pain: Secondary | ICD-10-CM | POA: Diagnosis not present

## 2016-12-01 DIAGNOSIS — F4542 Pain disorder with related psychological factors: Secondary | ICD-10-CM | POA: Diagnosis not present

## 2016-12-01 DIAGNOSIS — M542 Cervicalgia: Secondary | ICD-10-CM | POA: Diagnosis not present

## 2016-12-01 DIAGNOSIS — M5417 Radiculopathy, lumbosacral region: Secondary | ICD-10-CM | POA: Diagnosis not present

## 2016-12-05 DIAGNOSIS — F4542 Pain disorder with related psychological factors: Secondary | ICD-10-CM | POA: Diagnosis not present

## 2016-12-05 DIAGNOSIS — M542 Cervicalgia: Secondary | ICD-10-CM | POA: Diagnosis not present

## 2016-12-05 DIAGNOSIS — M5417 Radiculopathy, lumbosacral region: Secondary | ICD-10-CM | POA: Diagnosis not present

## 2016-12-05 DIAGNOSIS — M545 Low back pain: Secondary | ICD-10-CM | POA: Diagnosis not present

## 2016-12-06 DIAGNOSIS — F324 Major depressive disorder, single episode, in partial remission: Secondary | ICD-10-CM | POA: Diagnosis not present

## 2016-12-07 DIAGNOSIS — M5417 Radiculopathy, lumbosacral region: Secondary | ICD-10-CM | POA: Diagnosis not present

## 2016-12-07 DIAGNOSIS — M542 Cervicalgia: Secondary | ICD-10-CM | POA: Diagnosis not present

## 2016-12-07 DIAGNOSIS — F4542 Pain disorder with related psychological factors: Secondary | ICD-10-CM | POA: Diagnosis not present

## 2016-12-07 DIAGNOSIS — M545 Low back pain: Secondary | ICD-10-CM | POA: Diagnosis not present

## 2016-12-08 DIAGNOSIS — F4542 Pain disorder with related psychological factors: Secondary | ICD-10-CM | POA: Diagnosis not present

## 2016-12-08 DIAGNOSIS — M542 Cervicalgia: Secondary | ICD-10-CM | POA: Diagnosis not present

## 2016-12-08 DIAGNOSIS — M545 Low back pain: Secondary | ICD-10-CM | POA: Diagnosis not present

## 2016-12-08 DIAGNOSIS — M5417 Radiculopathy, lumbosacral region: Secondary | ICD-10-CM | POA: Diagnosis not present

## 2016-12-12 DIAGNOSIS — M5417 Radiculopathy, lumbosacral region: Secondary | ICD-10-CM | POA: Diagnosis not present

## 2016-12-12 DIAGNOSIS — M542 Cervicalgia: Secondary | ICD-10-CM | POA: Diagnosis not present

## 2016-12-12 DIAGNOSIS — M545 Low back pain: Secondary | ICD-10-CM | POA: Diagnosis not present

## 2016-12-12 DIAGNOSIS — F4542 Pain disorder with related psychological factors: Secondary | ICD-10-CM | POA: Diagnosis not present

## 2016-12-15 DIAGNOSIS — M542 Cervicalgia: Secondary | ICD-10-CM | POA: Diagnosis not present

## 2016-12-15 DIAGNOSIS — F4542 Pain disorder with related psychological factors: Secondary | ICD-10-CM | POA: Diagnosis not present

## 2016-12-15 DIAGNOSIS — M5417 Radiculopathy, lumbosacral region: Secondary | ICD-10-CM | POA: Diagnosis not present

## 2016-12-15 DIAGNOSIS — M545 Low back pain: Secondary | ICD-10-CM | POA: Diagnosis not present

## 2016-12-15 DIAGNOSIS — R197 Diarrhea, unspecified: Secondary | ICD-10-CM | POA: Diagnosis not present

## 2016-12-21 DIAGNOSIS — F324 Major depressive disorder, single episode, in partial remission: Secondary | ICD-10-CM | POA: Diagnosis not present

## 2016-12-22 DIAGNOSIS — M545 Low back pain: Secondary | ICD-10-CM | POA: Diagnosis not present

## 2016-12-22 DIAGNOSIS — M542 Cervicalgia: Secondary | ICD-10-CM | POA: Diagnosis not present

## 2016-12-22 DIAGNOSIS — M5417 Radiculopathy, lumbosacral region: Secondary | ICD-10-CM | POA: Diagnosis not present

## 2016-12-22 DIAGNOSIS — F4542 Pain disorder with related psychological factors: Secondary | ICD-10-CM | POA: Diagnosis not present

## 2016-12-26 DIAGNOSIS — R197 Diarrhea, unspecified: Secondary | ICD-10-CM | POA: Diagnosis not present

## 2017-01-09 DIAGNOSIS — M5417 Radiculopathy, lumbosacral region: Secondary | ICD-10-CM | POA: Diagnosis not present

## 2017-01-09 DIAGNOSIS — F4542 Pain disorder with related psychological factors: Secondary | ICD-10-CM | POA: Diagnosis not present

## 2017-01-09 DIAGNOSIS — M542 Cervicalgia: Secondary | ICD-10-CM | POA: Diagnosis not present

## 2017-01-09 DIAGNOSIS — M545 Low back pain: Secondary | ICD-10-CM | POA: Diagnosis not present

## 2017-01-10 DIAGNOSIS — F324 Major depressive disorder, single episode, in partial remission: Secondary | ICD-10-CM | POA: Diagnosis not present

## 2017-01-11 DIAGNOSIS — M545 Low back pain: Secondary | ICD-10-CM | POA: Diagnosis not present

## 2017-01-11 DIAGNOSIS — F4542 Pain disorder with related psychological factors: Secondary | ICD-10-CM | POA: Diagnosis not present

## 2017-01-11 DIAGNOSIS — M542 Cervicalgia: Secondary | ICD-10-CM | POA: Diagnosis not present

## 2017-01-11 DIAGNOSIS — M5417 Radiculopathy, lumbosacral region: Secondary | ICD-10-CM | POA: Diagnosis not present

## 2017-01-12 DIAGNOSIS — M545 Low back pain: Secondary | ICD-10-CM | POA: Diagnosis not present

## 2017-01-12 DIAGNOSIS — M5417 Radiculopathy, lumbosacral region: Secondary | ICD-10-CM | POA: Diagnosis not present

## 2017-01-12 DIAGNOSIS — M542 Cervicalgia: Secondary | ICD-10-CM | POA: Diagnosis not present

## 2017-01-12 DIAGNOSIS — F4542 Pain disorder with related psychological factors: Secondary | ICD-10-CM | POA: Diagnosis not present

## 2017-01-16 DIAGNOSIS — M5417 Radiculopathy, lumbosacral region: Secondary | ICD-10-CM | POA: Diagnosis not present

## 2017-01-16 DIAGNOSIS — M545 Low back pain: Secondary | ICD-10-CM | POA: Diagnosis not present

## 2017-01-16 DIAGNOSIS — M542 Cervicalgia: Secondary | ICD-10-CM | POA: Diagnosis not present

## 2017-01-16 DIAGNOSIS — F4542 Pain disorder with related psychological factors: Secondary | ICD-10-CM | POA: Diagnosis not present

## 2017-01-17 DIAGNOSIS — F324 Major depressive disorder, single episode, in partial remission: Secondary | ICD-10-CM | POA: Diagnosis not present

## 2017-01-18 DIAGNOSIS — M5417 Radiculopathy, lumbosacral region: Secondary | ICD-10-CM | POA: Diagnosis not present

## 2017-01-18 DIAGNOSIS — M542 Cervicalgia: Secondary | ICD-10-CM | POA: Diagnosis not present

## 2017-01-18 DIAGNOSIS — F4542 Pain disorder with related psychological factors: Secondary | ICD-10-CM | POA: Diagnosis not present

## 2017-01-18 DIAGNOSIS — M545 Low back pain: Secondary | ICD-10-CM | POA: Diagnosis not present

## 2017-01-23 DIAGNOSIS — M5417 Radiculopathy, lumbosacral region: Secondary | ICD-10-CM | POA: Diagnosis not present

## 2017-01-23 DIAGNOSIS — F4542 Pain disorder with related psychological factors: Secondary | ICD-10-CM | POA: Diagnosis not present

## 2017-01-23 DIAGNOSIS — M542 Cervicalgia: Secondary | ICD-10-CM | POA: Diagnosis not present

## 2017-01-23 DIAGNOSIS — M545 Low back pain: Secondary | ICD-10-CM | POA: Diagnosis not present

## 2017-01-24 DIAGNOSIS — M545 Low back pain: Secondary | ICD-10-CM | POA: Diagnosis not present

## 2017-01-24 DIAGNOSIS — M542 Cervicalgia: Secondary | ICD-10-CM | POA: Diagnosis not present

## 2017-01-24 DIAGNOSIS — F4542 Pain disorder with related psychological factors: Secondary | ICD-10-CM | POA: Diagnosis not present

## 2017-01-24 DIAGNOSIS — M5417 Radiculopathy, lumbosacral region: Secondary | ICD-10-CM | POA: Diagnosis not present

## 2017-01-25 DIAGNOSIS — M545 Low back pain: Secondary | ICD-10-CM | POA: Diagnosis not present

## 2017-01-25 DIAGNOSIS — M5417 Radiculopathy, lumbosacral region: Secondary | ICD-10-CM | POA: Diagnosis not present

## 2017-01-25 DIAGNOSIS — M542 Cervicalgia: Secondary | ICD-10-CM | POA: Diagnosis not present

## 2017-01-25 DIAGNOSIS — F4542 Pain disorder with related psychological factors: Secondary | ICD-10-CM | POA: Diagnosis not present

## 2017-01-30 DIAGNOSIS — M542 Cervicalgia: Secondary | ICD-10-CM | POA: Diagnosis not present

## 2017-01-30 DIAGNOSIS — M5417 Radiculopathy, lumbosacral region: Secondary | ICD-10-CM | POA: Diagnosis not present

## 2017-01-30 DIAGNOSIS — F4542 Pain disorder with related psychological factors: Secondary | ICD-10-CM | POA: Diagnosis not present

## 2017-01-30 DIAGNOSIS — M545 Low back pain: Secondary | ICD-10-CM | POA: Diagnosis not present

## 2017-01-31 DIAGNOSIS — F324 Major depressive disorder, single episode, in partial remission: Secondary | ICD-10-CM | POA: Diagnosis not present

## 2017-02-02 DIAGNOSIS — F4542 Pain disorder with related psychological factors: Secondary | ICD-10-CM | POA: Diagnosis not present

## 2017-02-02 DIAGNOSIS — M542 Cervicalgia: Secondary | ICD-10-CM | POA: Diagnosis not present

## 2017-02-02 DIAGNOSIS — M5417 Radiculopathy, lumbosacral region: Secondary | ICD-10-CM | POA: Diagnosis not present

## 2017-02-02 DIAGNOSIS — M545 Low back pain: Secondary | ICD-10-CM | POA: Diagnosis not present

## 2017-02-06 DIAGNOSIS — M5417 Radiculopathy, lumbosacral region: Secondary | ICD-10-CM | POA: Diagnosis not present

## 2017-02-06 DIAGNOSIS — M542 Cervicalgia: Secondary | ICD-10-CM | POA: Diagnosis not present

## 2017-02-06 DIAGNOSIS — F4542 Pain disorder with related psychological factors: Secondary | ICD-10-CM | POA: Diagnosis not present

## 2017-02-06 DIAGNOSIS — M545 Low back pain: Secondary | ICD-10-CM | POA: Diagnosis not present

## 2017-02-09 DIAGNOSIS — M545 Low back pain: Secondary | ICD-10-CM | POA: Diagnosis not present

## 2017-02-09 DIAGNOSIS — F4542 Pain disorder with related psychological factors: Secondary | ICD-10-CM | POA: Diagnosis not present

## 2017-02-09 DIAGNOSIS — M542 Cervicalgia: Secondary | ICD-10-CM | POA: Diagnosis not present

## 2017-02-09 DIAGNOSIS — M5417 Radiculopathy, lumbosacral region: Secondary | ICD-10-CM | POA: Diagnosis not present

## 2017-02-13 DIAGNOSIS — M542 Cervicalgia: Secondary | ICD-10-CM | POA: Diagnosis not present

## 2017-02-13 DIAGNOSIS — M545 Low back pain: Secondary | ICD-10-CM | POA: Diagnosis not present

## 2017-02-13 DIAGNOSIS — F4542 Pain disorder with related psychological factors: Secondary | ICD-10-CM | POA: Diagnosis not present

## 2017-02-13 DIAGNOSIS — M5417 Radiculopathy, lumbosacral region: Secondary | ICD-10-CM | POA: Diagnosis not present

## 2017-02-14 DIAGNOSIS — M545 Low back pain: Secondary | ICD-10-CM | POA: Diagnosis not present

## 2017-02-14 DIAGNOSIS — F4542 Pain disorder with related psychological factors: Secondary | ICD-10-CM | POA: Diagnosis not present

## 2017-02-14 DIAGNOSIS — M542 Cervicalgia: Secondary | ICD-10-CM | POA: Diagnosis not present

## 2017-02-14 DIAGNOSIS — M5417 Radiculopathy, lumbosacral region: Secondary | ICD-10-CM | POA: Diagnosis not present

## 2017-02-16 DIAGNOSIS — M5417 Radiculopathy, lumbosacral region: Secondary | ICD-10-CM | POA: Diagnosis not present

## 2017-02-16 DIAGNOSIS — M545 Low back pain: Secondary | ICD-10-CM | POA: Diagnosis not present

## 2017-02-16 DIAGNOSIS — M542 Cervicalgia: Secondary | ICD-10-CM | POA: Diagnosis not present

## 2017-02-16 DIAGNOSIS — F4542 Pain disorder with related psychological factors: Secondary | ICD-10-CM | POA: Diagnosis not present

## 2017-02-20 DIAGNOSIS — F4542 Pain disorder with related psychological factors: Secondary | ICD-10-CM | POA: Diagnosis not present

## 2017-02-20 DIAGNOSIS — M5417 Radiculopathy, lumbosacral region: Secondary | ICD-10-CM | POA: Diagnosis not present

## 2017-02-20 DIAGNOSIS — M545 Low back pain: Secondary | ICD-10-CM | POA: Diagnosis not present

## 2017-02-20 DIAGNOSIS — M542 Cervicalgia: Secondary | ICD-10-CM | POA: Diagnosis not present

## 2017-02-21 DIAGNOSIS — F324 Major depressive disorder, single episode, in partial remission: Secondary | ICD-10-CM | POA: Diagnosis not present

## 2017-02-22 DIAGNOSIS — M542 Cervicalgia: Secondary | ICD-10-CM | POA: Diagnosis not present

## 2017-02-22 DIAGNOSIS — M545 Low back pain: Secondary | ICD-10-CM | POA: Diagnosis not present

## 2017-02-22 DIAGNOSIS — M5417 Radiculopathy, lumbosacral region: Secondary | ICD-10-CM | POA: Diagnosis not present

## 2017-02-22 DIAGNOSIS — F4542 Pain disorder with related psychological factors: Secondary | ICD-10-CM | POA: Diagnosis not present

## 2017-02-23 DIAGNOSIS — M542 Cervicalgia: Secondary | ICD-10-CM | POA: Diagnosis not present

## 2017-02-23 DIAGNOSIS — F4542 Pain disorder with related psychological factors: Secondary | ICD-10-CM | POA: Diagnosis not present

## 2017-02-23 DIAGNOSIS — M5417 Radiculopathy, lumbosacral region: Secondary | ICD-10-CM | POA: Diagnosis not present

## 2017-02-23 DIAGNOSIS — M545 Low back pain: Secondary | ICD-10-CM | POA: Diagnosis not present

## 2017-02-27 DIAGNOSIS — M5417 Radiculopathy, lumbosacral region: Secondary | ICD-10-CM | POA: Diagnosis not present

## 2017-02-27 DIAGNOSIS — F4542 Pain disorder with related psychological factors: Secondary | ICD-10-CM | POA: Diagnosis not present

## 2017-02-27 DIAGNOSIS — M545 Low back pain: Secondary | ICD-10-CM | POA: Diagnosis not present

## 2017-02-27 DIAGNOSIS — M542 Cervicalgia: Secondary | ICD-10-CM | POA: Diagnosis not present

## 2017-02-28 DIAGNOSIS — M545 Low back pain: Secondary | ICD-10-CM | POA: Diagnosis not present

## 2017-02-28 DIAGNOSIS — F4542 Pain disorder with related psychological factors: Secondary | ICD-10-CM | POA: Diagnosis not present

## 2017-02-28 DIAGNOSIS — M5417 Radiculopathy, lumbosacral region: Secondary | ICD-10-CM | POA: Diagnosis not present

## 2017-02-28 DIAGNOSIS — M542 Cervicalgia: Secondary | ICD-10-CM | POA: Diagnosis not present

## 2017-03-01 DIAGNOSIS — F4542 Pain disorder with related psychological factors: Secondary | ICD-10-CM | POA: Diagnosis not present

## 2017-03-01 DIAGNOSIS — M545 Low back pain: Secondary | ICD-10-CM | POA: Diagnosis not present

## 2017-03-01 DIAGNOSIS — M542 Cervicalgia: Secondary | ICD-10-CM | POA: Diagnosis not present

## 2017-03-01 DIAGNOSIS — M5417 Radiculopathy, lumbosacral region: Secondary | ICD-10-CM | POA: Diagnosis not present

## 2017-03-06 DIAGNOSIS — M545 Low back pain: Secondary | ICD-10-CM | POA: Diagnosis not present

## 2017-03-06 DIAGNOSIS — M542 Cervicalgia: Secondary | ICD-10-CM | POA: Diagnosis not present

## 2017-03-06 DIAGNOSIS — M5417 Radiculopathy, lumbosacral region: Secondary | ICD-10-CM | POA: Diagnosis not present

## 2017-03-06 DIAGNOSIS — F4542 Pain disorder with related psychological factors: Secondary | ICD-10-CM | POA: Diagnosis not present

## 2017-03-07 DIAGNOSIS — M5417 Radiculopathy, lumbosacral region: Secondary | ICD-10-CM | POA: Diagnosis not present

## 2017-03-07 DIAGNOSIS — M545 Low back pain: Secondary | ICD-10-CM | POA: Diagnosis not present

## 2017-03-07 DIAGNOSIS — F4542 Pain disorder with related psychological factors: Secondary | ICD-10-CM | POA: Diagnosis not present

## 2017-03-07 DIAGNOSIS — M542 Cervicalgia: Secondary | ICD-10-CM | POA: Diagnosis not present

## 2017-03-08 DIAGNOSIS — M542 Cervicalgia: Secondary | ICD-10-CM | POA: Diagnosis not present

## 2017-03-08 DIAGNOSIS — M5417 Radiculopathy, lumbosacral region: Secondary | ICD-10-CM | POA: Diagnosis not present

## 2017-03-08 DIAGNOSIS — M545 Low back pain: Secondary | ICD-10-CM | POA: Diagnosis not present

## 2017-03-08 DIAGNOSIS — F4542 Pain disorder with related psychological factors: Secondary | ICD-10-CM | POA: Diagnosis not present

## 2017-03-13 DIAGNOSIS — M5417 Radiculopathy, lumbosacral region: Secondary | ICD-10-CM | POA: Diagnosis not present

## 2017-03-13 DIAGNOSIS — F4542 Pain disorder with related psychological factors: Secondary | ICD-10-CM | POA: Diagnosis not present

## 2017-03-13 DIAGNOSIS — M542 Cervicalgia: Secondary | ICD-10-CM | POA: Diagnosis not present

## 2017-03-13 DIAGNOSIS — M545 Low back pain: Secondary | ICD-10-CM | POA: Diagnosis not present

## 2017-03-14 DIAGNOSIS — F324 Major depressive disorder, single episode, in partial remission: Secondary | ICD-10-CM | POA: Diagnosis not present

## 2017-03-15 DIAGNOSIS — M5417 Radiculopathy, lumbosacral region: Secondary | ICD-10-CM | POA: Diagnosis not present

## 2017-03-15 DIAGNOSIS — M545 Low back pain: Secondary | ICD-10-CM | POA: Diagnosis not present

## 2017-03-15 DIAGNOSIS — F4542 Pain disorder with related psychological factors: Secondary | ICD-10-CM | POA: Diagnosis not present

## 2017-03-15 DIAGNOSIS — M542 Cervicalgia: Secondary | ICD-10-CM | POA: Diagnosis not present

## 2017-03-20 DIAGNOSIS — M5417 Radiculopathy, lumbosacral region: Secondary | ICD-10-CM | POA: Diagnosis not present

## 2017-03-20 DIAGNOSIS — M545 Low back pain: Secondary | ICD-10-CM | POA: Diagnosis not present

## 2017-03-20 DIAGNOSIS — F4542 Pain disorder with related psychological factors: Secondary | ICD-10-CM | POA: Diagnosis not present

## 2017-03-20 DIAGNOSIS — M542 Cervicalgia: Secondary | ICD-10-CM | POA: Diagnosis not present

## 2017-03-21 DIAGNOSIS — M542 Cervicalgia: Secondary | ICD-10-CM | POA: Diagnosis not present

## 2017-03-21 DIAGNOSIS — M545 Low back pain: Secondary | ICD-10-CM | POA: Diagnosis not present

## 2017-03-21 DIAGNOSIS — F4542 Pain disorder with related psychological factors: Secondary | ICD-10-CM | POA: Diagnosis not present

## 2017-03-21 DIAGNOSIS — M5417 Radiculopathy, lumbosacral region: Secondary | ICD-10-CM | POA: Diagnosis not present

## 2017-03-22 DIAGNOSIS — M542 Cervicalgia: Secondary | ICD-10-CM | POA: Diagnosis not present

## 2017-03-22 DIAGNOSIS — M5417 Radiculopathy, lumbosacral region: Secondary | ICD-10-CM | POA: Diagnosis not present

## 2017-03-22 DIAGNOSIS — F4542 Pain disorder with related psychological factors: Secondary | ICD-10-CM | POA: Diagnosis not present

## 2017-03-22 DIAGNOSIS — M545 Low back pain: Secondary | ICD-10-CM | POA: Diagnosis not present

## 2017-03-28 DIAGNOSIS — F4542 Pain disorder with related psychological factors: Secondary | ICD-10-CM | POA: Diagnosis not present

## 2017-03-28 DIAGNOSIS — M545 Low back pain: Secondary | ICD-10-CM | POA: Diagnosis not present

## 2017-03-28 DIAGNOSIS — M542 Cervicalgia: Secondary | ICD-10-CM | POA: Diagnosis not present

## 2017-03-28 DIAGNOSIS — M5417 Radiculopathy, lumbosacral region: Secondary | ICD-10-CM | POA: Diagnosis not present

## 2017-03-29 DIAGNOSIS — M545 Low back pain: Secondary | ICD-10-CM | POA: Diagnosis not present

## 2017-03-29 DIAGNOSIS — M542 Cervicalgia: Secondary | ICD-10-CM | POA: Diagnosis not present

## 2017-03-29 DIAGNOSIS — F4542 Pain disorder with related psychological factors: Secondary | ICD-10-CM | POA: Diagnosis not present

## 2017-03-29 DIAGNOSIS — M5417 Radiculopathy, lumbosacral region: Secondary | ICD-10-CM | POA: Diagnosis not present

## 2017-04-03 DIAGNOSIS — M542 Cervicalgia: Secondary | ICD-10-CM | POA: Diagnosis not present

## 2017-04-03 DIAGNOSIS — M545 Low back pain: Secondary | ICD-10-CM | POA: Diagnosis not present

## 2017-04-03 DIAGNOSIS — M5417 Radiculopathy, lumbosacral region: Secondary | ICD-10-CM | POA: Diagnosis not present

## 2017-04-03 DIAGNOSIS — F4542 Pain disorder with related psychological factors: Secondary | ICD-10-CM | POA: Diagnosis not present

## 2017-04-04 DIAGNOSIS — F324 Major depressive disorder, single episode, in partial remission: Secondary | ICD-10-CM | POA: Diagnosis not present

## 2017-04-09 DIAGNOSIS — F4542 Pain disorder with related psychological factors: Secondary | ICD-10-CM | POA: Diagnosis not present

## 2017-04-09 DIAGNOSIS — M545 Low back pain: Secondary | ICD-10-CM | POA: Diagnosis not present

## 2017-04-09 DIAGNOSIS — M542 Cervicalgia: Secondary | ICD-10-CM | POA: Diagnosis not present

## 2017-04-09 DIAGNOSIS — M5417 Radiculopathy, lumbosacral region: Secondary | ICD-10-CM | POA: Diagnosis not present

## 2017-04-10 DIAGNOSIS — M5417 Radiculopathy, lumbosacral region: Secondary | ICD-10-CM | POA: Diagnosis not present

## 2017-04-10 DIAGNOSIS — M542 Cervicalgia: Secondary | ICD-10-CM | POA: Diagnosis not present

## 2017-04-10 DIAGNOSIS — F4542 Pain disorder with related psychological factors: Secondary | ICD-10-CM | POA: Diagnosis not present

## 2017-04-10 DIAGNOSIS — M545 Low back pain: Secondary | ICD-10-CM | POA: Diagnosis not present

## 2017-04-16 DIAGNOSIS — M5417 Radiculopathy, lumbosacral region: Secondary | ICD-10-CM | POA: Diagnosis not present

## 2017-04-16 DIAGNOSIS — F4542 Pain disorder with related psychological factors: Secondary | ICD-10-CM | POA: Diagnosis not present

## 2017-04-16 DIAGNOSIS — M545 Low back pain: Secondary | ICD-10-CM | POA: Diagnosis not present

## 2017-04-16 DIAGNOSIS — M542 Cervicalgia: Secondary | ICD-10-CM | POA: Diagnosis not present

## 2017-04-19 DIAGNOSIS — M542 Cervicalgia: Secondary | ICD-10-CM | POA: Diagnosis not present

## 2017-04-19 DIAGNOSIS — F4542 Pain disorder with related psychological factors: Secondary | ICD-10-CM | POA: Diagnosis not present

## 2017-04-19 DIAGNOSIS — M545 Low back pain: Secondary | ICD-10-CM | POA: Diagnosis not present

## 2017-04-19 DIAGNOSIS — M5417 Radiculopathy, lumbosacral region: Secondary | ICD-10-CM | POA: Diagnosis not present

## 2017-04-24 DIAGNOSIS — M542 Cervicalgia: Secondary | ICD-10-CM | POA: Diagnosis not present

## 2017-04-24 DIAGNOSIS — M545 Low back pain: Secondary | ICD-10-CM | POA: Diagnosis not present

## 2017-04-24 DIAGNOSIS — M5417 Radiculopathy, lumbosacral region: Secondary | ICD-10-CM | POA: Diagnosis not present

## 2017-04-24 DIAGNOSIS — F4542 Pain disorder with related psychological factors: Secondary | ICD-10-CM | POA: Diagnosis not present

## 2017-04-25 DIAGNOSIS — F324 Major depressive disorder, single episode, in partial remission: Secondary | ICD-10-CM | POA: Diagnosis not present

## 2017-05-01 DIAGNOSIS — F4542 Pain disorder with related psychological factors: Secondary | ICD-10-CM | POA: Diagnosis not present

## 2017-05-01 DIAGNOSIS — M5417 Radiculopathy, lumbosacral region: Secondary | ICD-10-CM | POA: Diagnosis not present

## 2017-05-01 DIAGNOSIS — M542 Cervicalgia: Secondary | ICD-10-CM | POA: Diagnosis not present

## 2017-05-01 DIAGNOSIS — M545 Low back pain: Secondary | ICD-10-CM | POA: Diagnosis not present

## 2017-05-03 DIAGNOSIS — F4542 Pain disorder with related psychological factors: Secondary | ICD-10-CM | POA: Diagnosis not present

## 2017-05-03 DIAGNOSIS — M545 Low back pain: Secondary | ICD-10-CM | POA: Diagnosis not present

## 2017-05-03 DIAGNOSIS — M5417 Radiculopathy, lumbosacral region: Secondary | ICD-10-CM | POA: Diagnosis not present

## 2017-05-03 DIAGNOSIS — M542 Cervicalgia: Secondary | ICD-10-CM | POA: Diagnosis not present

## 2017-05-08 DIAGNOSIS — M545 Low back pain: Secondary | ICD-10-CM | POA: Diagnosis not present

## 2017-05-08 DIAGNOSIS — F4542 Pain disorder with related psychological factors: Secondary | ICD-10-CM | POA: Diagnosis not present

## 2017-05-08 DIAGNOSIS — M542 Cervicalgia: Secondary | ICD-10-CM | POA: Diagnosis not present

## 2017-05-08 DIAGNOSIS — M5417 Radiculopathy, lumbosacral region: Secondary | ICD-10-CM | POA: Diagnosis not present

## 2017-05-10 DIAGNOSIS — M542 Cervicalgia: Secondary | ICD-10-CM | POA: Diagnosis not present

## 2017-05-10 DIAGNOSIS — F4542 Pain disorder with related psychological factors: Secondary | ICD-10-CM | POA: Diagnosis not present

## 2017-05-10 DIAGNOSIS — M5417 Radiculopathy, lumbosacral region: Secondary | ICD-10-CM | POA: Diagnosis not present

## 2017-05-10 DIAGNOSIS — M545 Low back pain: Secondary | ICD-10-CM | POA: Diagnosis not present

## 2017-05-14 DIAGNOSIS — M545 Low back pain: Secondary | ICD-10-CM | POA: Diagnosis not present

## 2017-05-14 DIAGNOSIS — M5417 Radiculopathy, lumbosacral region: Secondary | ICD-10-CM | POA: Diagnosis not present

## 2017-05-14 DIAGNOSIS — M542 Cervicalgia: Secondary | ICD-10-CM | POA: Diagnosis not present

## 2017-05-14 DIAGNOSIS — F4542 Pain disorder with related psychological factors: Secondary | ICD-10-CM | POA: Diagnosis not present

## 2017-05-16 DIAGNOSIS — F324 Major depressive disorder, single episode, in partial remission: Secondary | ICD-10-CM | POA: Diagnosis not present

## 2017-05-21 DIAGNOSIS — F324 Major depressive disorder, single episode, in partial remission: Secondary | ICD-10-CM | POA: Diagnosis not present

## 2017-05-22 DIAGNOSIS — M545 Low back pain: Secondary | ICD-10-CM | POA: Diagnosis not present

## 2017-05-22 DIAGNOSIS — M5417 Radiculopathy, lumbosacral region: Secondary | ICD-10-CM | POA: Diagnosis not present

## 2017-05-22 DIAGNOSIS — M542 Cervicalgia: Secondary | ICD-10-CM | POA: Diagnosis not present

## 2017-05-22 DIAGNOSIS — F4542 Pain disorder with related psychological factors: Secondary | ICD-10-CM | POA: Diagnosis not present

## 2017-05-24 DIAGNOSIS — F4542 Pain disorder with related psychological factors: Secondary | ICD-10-CM | POA: Diagnosis not present

## 2017-05-24 DIAGNOSIS — M5417 Radiculopathy, lumbosacral region: Secondary | ICD-10-CM | POA: Diagnosis not present

## 2017-05-24 DIAGNOSIS — M545 Low back pain: Secondary | ICD-10-CM | POA: Diagnosis not present

## 2017-05-24 DIAGNOSIS — M542 Cervicalgia: Secondary | ICD-10-CM | POA: Diagnosis not present

## 2017-05-31 DIAGNOSIS — M5417 Radiculopathy, lumbosacral region: Secondary | ICD-10-CM | POA: Diagnosis not present

## 2017-05-31 DIAGNOSIS — F4542 Pain disorder with related psychological factors: Secondary | ICD-10-CM | POA: Diagnosis not present

## 2017-05-31 DIAGNOSIS — M545 Low back pain: Secondary | ICD-10-CM | POA: Diagnosis not present

## 2017-05-31 DIAGNOSIS — M542 Cervicalgia: Secondary | ICD-10-CM | POA: Diagnosis not present

## 2017-06-06 DIAGNOSIS — F324 Major depressive disorder, single episode, in partial remission: Secondary | ICD-10-CM | POA: Diagnosis not present

## 2017-06-07 DIAGNOSIS — M545 Low back pain: Secondary | ICD-10-CM | POA: Diagnosis not present

## 2017-06-07 DIAGNOSIS — M542 Cervicalgia: Secondary | ICD-10-CM | POA: Diagnosis not present

## 2017-06-07 DIAGNOSIS — F4542 Pain disorder with related psychological factors: Secondary | ICD-10-CM | POA: Diagnosis not present

## 2017-06-07 DIAGNOSIS — M5417 Radiculopathy, lumbosacral region: Secondary | ICD-10-CM | POA: Diagnosis not present

## 2017-06-12 DIAGNOSIS — M545 Low back pain: Secondary | ICD-10-CM | POA: Diagnosis not present

## 2017-06-12 DIAGNOSIS — M542 Cervicalgia: Secondary | ICD-10-CM | POA: Diagnosis not present

## 2017-06-12 DIAGNOSIS — F4542 Pain disorder with related psychological factors: Secondary | ICD-10-CM | POA: Diagnosis not present

## 2017-06-12 DIAGNOSIS — M5417 Radiculopathy, lumbosacral region: Secondary | ICD-10-CM | POA: Diagnosis not present

## 2017-06-14 DIAGNOSIS — M542 Cervicalgia: Secondary | ICD-10-CM | POA: Diagnosis not present

## 2017-06-14 DIAGNOSIS — M5417 Radiculopathy, lumbosacral region: Secondary | ICD-10-CM | POA: Diagnosis not present

## 2017-06-14 DIAGNOSIS — F4542 Pain disorder with related psychological factors: Secondary | ICD-10-CM | POA: Diagnosis not present

## 2017-06-14 DIAGNOSIS — M545 Low back pain: Secondary | ICD-10-CM | POA: Diagnosis not present

## 2017-06-21 DIAGNOSIS — F4542 Pain disorder with related psychological factors: Secondary | ICD-10-CM | POA: Diagnosis not present

## 2017-06-21 DIAGNOSIS — M545 Low back pain: Secondary | ICD-10-CM | POA: Diagnosis not present

## 2017-06-21 DIAGNOSIS — M5417 Radiculopathy, lumbosacral region: Secondary | ICD-10-CM | POA: Diagnosis not present

## 2017-06-21 DIAGNOSIS — M542 Cervicalgia: Secondary | ICD-10-CM | POA: Diagnosis not present

## 2017-06-27 DIAGNOSIS — F324 Major depressive disorder, single episode, in partial remission: Secondary | ICD-10-CM | POA: Diagnosis not present

## 2017-06-28 DIAGNOSIS — F4542 Pain disorder with related psychological factors: Secondary | ICD-10-CM | POA: Diagnosis not present

## 2017-06-28 DIAGNOSIS — M5417 Radiculopathy, lumbosacral region: Secondary | ICD-10-CM | POA: Diagnosis not present

## 2017-06-28 DIAGNOSIS — M542 Cervicalgia: Secondary | ICD-10-CM | POA: Diagnosis not present

## 2017-06-28 DIAGNOSIS — M545 Low back pain: Secondary | ICD-10-CM | POA: Diagnosis not present

## 2017-07-03 DIAGNOSIS — M542 Cervicalgia: Secondary | ICD-10-CM | POA: Diagnosis not present

## 2017-07-03 DIAGNOSIS — M5417 Radiculopathy, lumbosacral region: Secondary | ICD-10-CM | POA: Diagnosis not present

## 2017-07-03 DIAGNOSIS — F4542 Pain disorder with related psychological factors: Secondary | ICD-10-CM | POA: Diagnosis not present

## 2017-07-03 DIAGNOSIS — M545 Low back pain: Secondary | ICD-10-CM | POA: Diagnosis not present

## 2017-07-10 DIAGNOSIS — M5417 Radiculopathy, lumbosacral region: Secondary | ICD-10-CM | POA: Diagnosis not present

## 2017-07-10 DIAGNOSIS — M542 Cervicalgia: Secondary | ICD-10-CM | POA: Diagnosis not present

## 2017-07-10 DIAGNOSIS — M545 Low back pain: Secondary | ICD-10-CM | POA: Diagnosis not present

## 2017-07-10 DIAGNOSIS — F4542 Pain disorder with related psychological factors: Secondary | ICD-10-CM | POA: Diagnosis not present

## 2017-07-12 DIAGNOSIS — M545 Low back pain: Secondary | ICD-10-CM | POA: Diagnosis not present

## 2017-07-12 DIAGNOSIS — M5417 Radiculopathy, lumbosacral region: Secondary | ICD-10-CM | POA: Diagnosis not present

## 2017-07-12 DIAGNOSIS — M542 Cervicalgia: Secondary | ICD-10-CM | POA: Diagnosis not present

## 2017-07-12 DIAGNOSIS — F4542 Pain disorder with related psychological factors: Secondary | ICD-10-CM | POA: Diagnosis not present

## 2017-07-17 DIAGNOSIS — M5417 Radiculopathy, lumbosacral region: Secondary | ICD-10-CM | POA: Diagnosis not present

## 2017-07-17 DIAGNOSIS — M542 Cervicalgia: Secondary | ICD-10-CM | POA: Diagnosis not present

## 2017-07-17 DIAGNOSIS — M545 Low back pain: Secondary | ICD-10-CM | POA: Diagnosis not present

## 2017-07-17 DIAGNOSIS — F4542 Pain disorder with related psychological factors: Secondary | ICD-10-CM | POA: Diagnosis not present

## 2017-07-18 DIAGNOSIS — F324 Major depressive disorder, single episode, in partial remission: Secondary | ICD-10-CM | POA: Diagnosis not present

## 2017-07-24 DIAGNOSIS — F4542 Pain disorder with related psychological factors: Secondary | ICD-10-CM | POA: Diagnosis not present

## 2017-07-24 DIAGNOSIS — M5417 Radiculopathy, lumbosacral region: Secondary | ICD-10-CM | POA: Diagnosis not present

## 2017-07-24 DIAGNOSIS — M542 Cervicalgia: Secondary | ICD-10-CM | POA: Diagnosis not present

## 2017-07-24 DIAGNOSIS — M545 Low back pain: Secondary | ICD-10-CM | POA: Diagnosis not present

## 2017-07-26 DIAGNOSIS — M542 Cervicalgia: Secondary | ICD-10-CM | POA: Diagnosis not present

## 2017-07-26 DIAGNOSIS — F4542 Pain disorder with related psychological factors: Secondary | ICD-10-CM | POA: Diagnosis not present

## 2017-07-26 DIAGNOSIS — M545 Low back pain: Secondary | ICD-10-CM | POA: Diagnosis not present

## 2017-07-26 DIAGNOSIS — M5417 Radiculopathy, lumbosacral region: Secondary | ICD-10-CM | POA: Diagnosis not present

## 2017-07-31 DIAGNOSIS — M542 Cervicalgia: Secondary | ICD-10-CM | POA: Diagnosis not present

## 2017-07-31 DIAGNOSIS — F4542 Pain disorder with related psychological factors: Secondary | ICD-10-CM | POA: Diagnosis not present

## 2017-07-31 DIAGNOSIS — M5417 Radiculopathy, lumbosacral region: Secondary | ICD-10-CM | POA: Diagnosis not present

## 2017-07-31 DIAGNOSIS — M545 Low back pain: Secondary | ICD-10-CM | POA: Diagnosis not present

## 2017-08-02 DIAGNOSIS — M542 Cervicalgia: Secondary | ICD-10-CM | POA: Diagnosis not present

## 2017-08-02 DIAGNOSIS — F4542 Pain disorder with related psychological factors: Secondary | ICD-10-CM | POA: Diagnosis not present

## 2017-08-02 DIAGNOSIS — M545 Low back pain: Secondary | ICD-10-CM | POA: Diagnosis not present

## 2017-08-02 DIAGNOSIS — M5417 Radiculopathy, lumbosacral region: Secondary | ICD-10-CM | POA: Diagnosis not present

## 2017-08-07 DIAGNOSIS — M5417 Radiculopathy, lumbosacral region: Secondary | ICD-10-CM | POA: Diagnosis not present

## 2017-08-07 DIAGNOSIS — M542 Cervicalgia: Secondary | ICD-10-CM | POA: Diagnosis not present

## 2017-08-07 DIAGNOSIS — F4542 Pain disorder with related psychological factors: Secondary | ICD-10-CM | POA: Diagnosis not present

## 2017-08-07 DIAGNOSIS — M545 Low back pain: Secondary | ICD-10-CM | POA: Diagnosis not present

## 2017-08-09 DIAGNOSIS — M545 Low back pain: Secondary | ICD-10-CM | POA: Diagnosis not present

## 2017-08-09 DIAGNOSIS — M542 Cervicalgia: Secondary | ICD-10-CM | POA: Diagnosis not present

## 2017-08-09 DIAGNOSIS — M5417 Radiculopathy, lumbosacral region: Secondary | ICD-10-CM | POA: Diagnosis not present

## 2017-08-09 DIAGNOSIS — F4542 Pain disorder with related psychological factors: Secondary | ICD-10-CM | POA: Diagnosis not present

## 2017-08-14 DIAGNOSIS — F324 Major depressive disorder, single episode, in partial remission: Secondary | ICD-10-CM | POA: Diagnosis not present

## 2017-08-16 DIAGNOSIS — F4542 Pain disorder with related psychological factors: Secondary | ICD-10-CM | POA: Diagnosis not present

## 2017-08-16 DIAGNOSIS — M5417 Radiculopathy, lumbosacral region: Secondary | ICD-10-CM | POA: Diagnosis not present

## 2017-08-16 DIAGNOSIS — M545 Low back pain: Secondary | ICD-10-CM | POA: Diagnosis not present

## 2017-08-16 DIAGNOSIS — M542 Cervicalgia: Secondary | ICD-10-CM | POA: Diagnosis not present

## 2017-08-21 DIAGNOSIS — M545 Low back pain: Secondary | ICD-10-CM | POA: Diagnosis not present

## 2017-08-21 DIAGNOSIS — M542 Cervicalgia: Secondary | ICD-10-CM | POA: Diagnosis not present

## 2017-08-21 DIAGNOSIS — M5417 Radiculopathy, lumbosacral region: Secondary | ICD-10-CM | POA: Diagnosis not present

## 2017-08-21 DIAGNOSIS — F4542 Pain disorder with related psychological factors: Secondary | ICD-10-CM | POA: Diagnosis not present

## 2017-08-23 DIAGNOSIS — M545 Low back pain: Secondary | ICD-10-CM | POA: Diagnosis not present

## 2017-08-23 DIAGNOSIS — M542 Cervicalgia: Secondary | ICD-10-CM | POA: Diagnosis not present

## 2017-08-23 DIAGNOSIS — M5417 Radiculopathy, lumbosacral region: Secondary | ICD-10-CM | POA: Diagnosis not present

## 2017-08-23 DIAGNOSIS — F4542 Pain disorder with related psychological factors: Secondary | ICD-10-CM | POA: Diagnosis not present

## 2017-08-28 DIAGNOSIS — M5417 Radiculopathy, lumbosacral region: Secondary | ICD-10-CM | POA: Diagnosis not present

## 2017-08-28 DIAGNOSIS — M542 Cervicalgia: Secondary | ICD-10-CM | POA: Diagnosis not present

## 2017-08-28 DIAGNOSIS — F4542 Pain disorder with related psychological factors: Secondary | ICD-10-CM | POA: Diagnosis not present

## 2017-08-28 DIAGNOSIS — M545 Low back pain: Secondary | ICD-10-CM | POA: Diagnosis not present

## 2017-08-30 DIAGNOSIS — M545 Low back pain: Secondary | ICD-10-CM | POA: Diagnosis not present

## 2017-08-30 DIAGNOSIS — M5417 Radiculopathy, lumbosacral region: Secondary | ICD-10-CM | POA: Diagnosis not present

## 2017-08-30 DIAGNOSIS — M542 Cervicalgia: Secondary | ICD-10-CM | POA: Diagnosis not present

## 2017-08-30 DIAGNOSIS — F4542 Pain disorder with related psychological factors: Secondary | ICD-10-CM | POA: Diagnosis not present

## 2017-09-04 DIAGNOSIS — M542 Cervicalgia: Secondary | ICD-10-CM | POA: Diagnosis not present

## 2017-09-04 DIAGNOSIS — M545 Low back pain: Secondary | ICD-10-CM | POA: Diagnosis not present

## 2017-09-04 DIAGNOSIS — F4542 Pain disorder with related psychological factors: Secondary | ICD-10-CM | POA: Diagnosis not present

## 2017-09-04 DIAGNOSIS — M5417 Radiculopathy, lumbosacral region: Secondary | ICD-10-CM | POA: Diagnosis not present

## 2017-09-05 DIAGNOSIS — F324 Major depressive disorder, single episode, in partial remission: Secondary | ICD-10-CM | POA: Diagnosis not present

## 2017-09-06 DIAGNOSIS — M5417 Radiculopathy, lumbosacral region: Secondary | ICD-10-CM | POA: Diagnosis not present

## 2017-09-06 DIAGNOSIS — F4542 Pain disorder with related psychological factors: Secondary | ICD-10-CM | POA: Diagnosis not present

## 2017-09-06 DIAGNOSIS — M542 Cervicalgia: Secondary | ICD-10-CM | POA: Diagnosis not present

## 2017-09-06 DIAGNOSIS — M545 Low back pain: Secondary | ICD-10-CM | POA: Diagnosis not present

## 2017-09-11 DIAGNOSIS — F4542 Pain disorder with related psychological factors: Secondary | ICD-10-CM | POA: Diagnosis not present

## 2017-09-11 DIAGNOSIS — M542 Cervicalgia: Secondary | ICD-10-CM | POA: Diagnosis not present

## 2017-09-11 DIAGNOSIS — M5417 Radiculopathy, lumbosacral region: Secondary | ICD-10-CM | POA: Diagnosis not present

## 2017-09-11 DIAGNOSIS — M545 Low back pain: Secondary | ICD-10-CM | POA: Diagnosis not present

## 2017-09-13 DIAGNOSIS — M5417 Radiculopathy, lumbosacral region: Secondary | ICD-10-CM | POA: Diagnosis not present

## 2017-09-13 DIAGNOSIS — M542 Cervicalgia: Secondary | ICD-10-CM | POA: Diagnosis not present

## 2017-09-13 DIAGNOSIS — M545 Low back pain: Secondary | ICD-10-CM | POA: Diagnosis not present

## 2017-09-13 DIAGNOSIS — F4542 Pain disorder with related psychological factors: Secondary | ICD-10-CM | POA: Diagnosis not present

## 2017-09-20 DIAGNOSIS — M545 Low back pain: Secondary | ICD-10-CM | POA: Diagnosis not present

## 2017-09-20 DIAGNOSIS — M5417 Radiculopathy, lumbosacral region: Secondary | ICD-10-CM | POA: Diagnosis not present

## 2017-09-20 DIAGNOSIS — F4542 Pain disorder with related psychological factors: Secondary | ICD-10-CM | POA: Diagnosis not present

## 2017-09-20 DIAGNOSIS — M542 Cervicalgia: Secondary | ICD-10-CM | POA: Diagnosis not present

## 2017-10-23 DIAGNOSIS — F324 Major depressive disorder, single episode, in partial remission: Secondary | ICD-10-CM | POA: Diagnosis not present

## 2017-11-30 DIAGNOSIS — F324 Major depressive disorder, single episode, in partial remission: Secondary | ICD-10-CM | POA: Diagnosis not present

## 2017-12-26 DIAGNOSIS — F324 Major depressive disorder, single episode, in partial remission: Secondary | ICD-10-CM | POA: Diagnosis not present

## 2018-01-16 DIAGNOSIS — F324 Major depressive disorder, single episode, in partial remission: Secondary | ICD-10-CM | POA: Diagnosis not present

## 2018-02-15 DIAGNOSIS — F324 Major depressive disorder, single episode, in partial remission: Secondary | ICD-10-CM | POA: Diagnosis not present

## 2018-02-18 DIAGNOSIS — F324 Major depressive disorder, single episode, in partial remission: Secondary | ICD-10-CM | POA: Diagnosis not present

## 2018-03-06 DIAGNOSIS — F324 Major depressive disorder, single episode, in partial remission: Secondary | ICD-10-CM | POA: Diagnosis not present

## 2018-03-07 IMAGING — CT CT CERVICAL SPINE W/O CM
5 of 7 series · 15 of 33 positions shown, 16 images · non-contrast
Comparison: MRI cervical spine 05/05/2012

CLINICAL DATA: Fall at church.

EXAM:
CT MAXILLOFACIAL WITHOUT CONTRAST
CT CERVICAL SPINE WITHOUT CONTRAST
TECHNIQUE: Multidetector CT imaging of the maxillofacial structures was
performed. Multiplanar CT image reconstructions were also generated.
A small metallic BB was placed on the right temple in order to
reliably differentiate right from left.
Multidetector CT imaging of the cervical spine was performed without
intravenous contrast. Multiplanar CT image reconstructions were also
generated.

[Series 2: facial st · axial · 0.29mm/px · z∈[-36,+20]mm · 2 of 84 slices shown]
[im 28/84  bone]
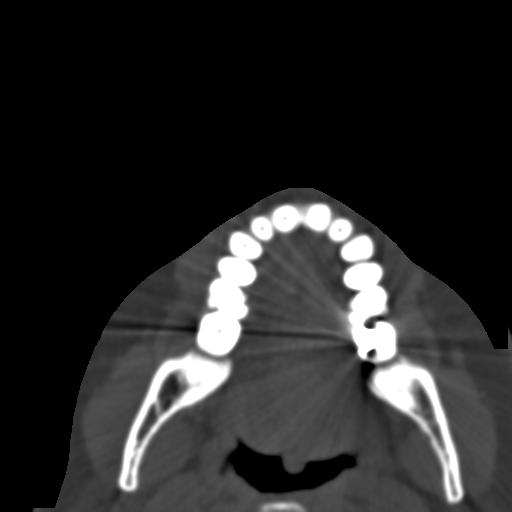
[im 56/84  bone]
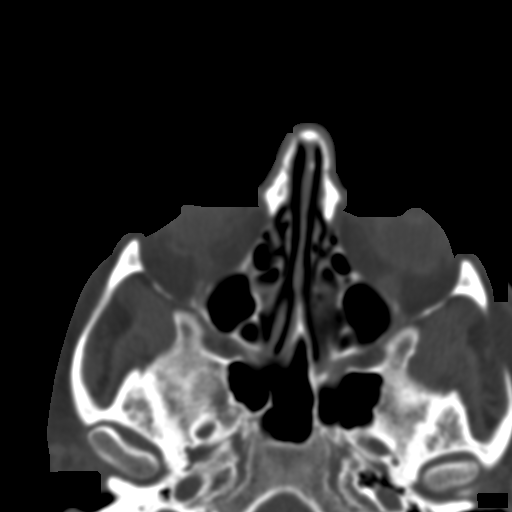

[Series 4: coronal st · coronal · 0.32mm/px · 2 of 102 slices shown]
[im 34/102  bone]
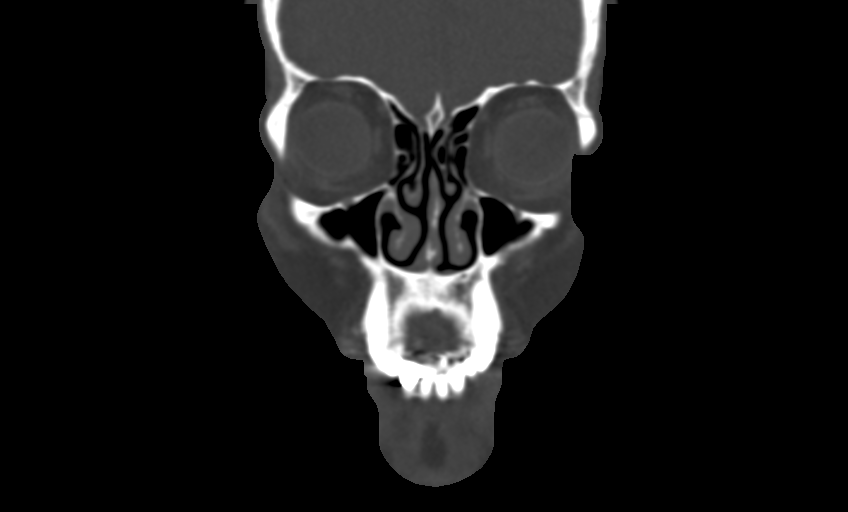
[im 68/102  bone]
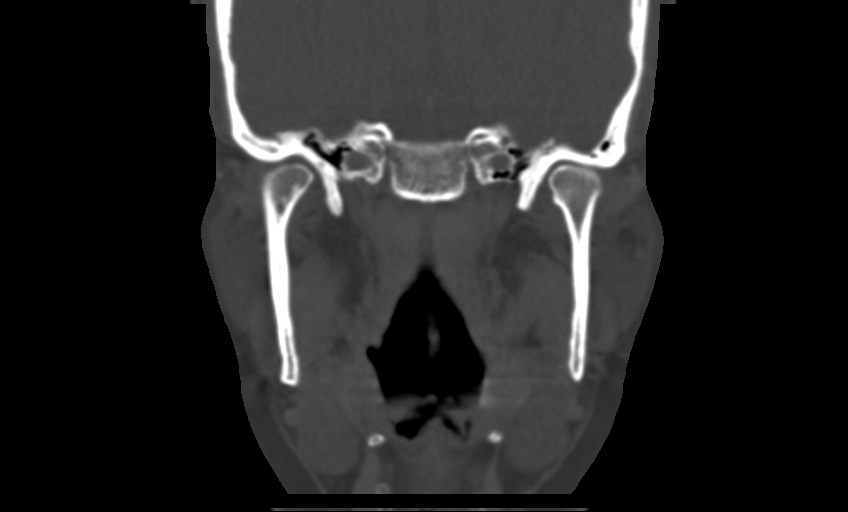

[Series 5: sagittal st · sagittal · 0.32mm/px · 5 of 76 slices shown]
[im 13/76  bone]
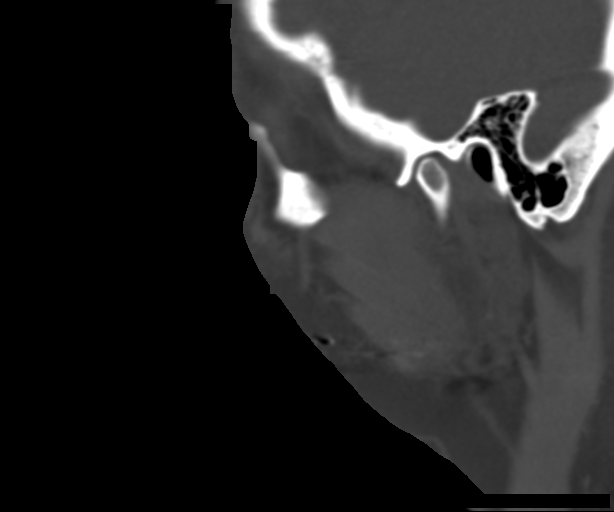
[im 26/76  bone]
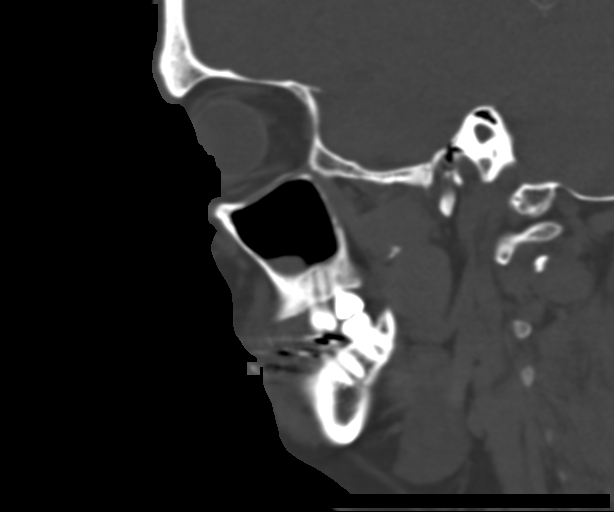
[im 38/76  bone]
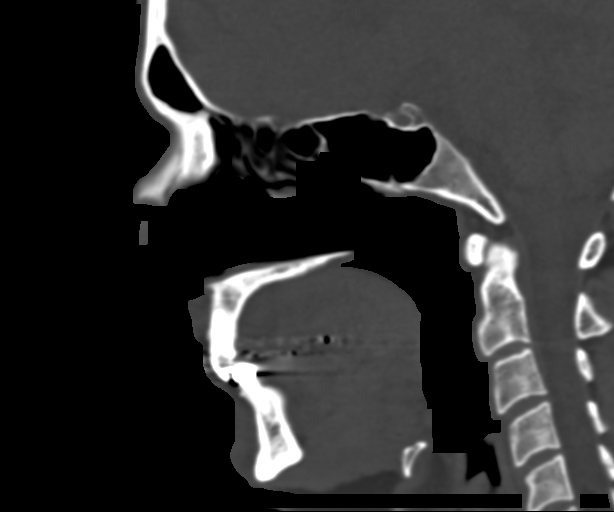
[im 51/76  bone]
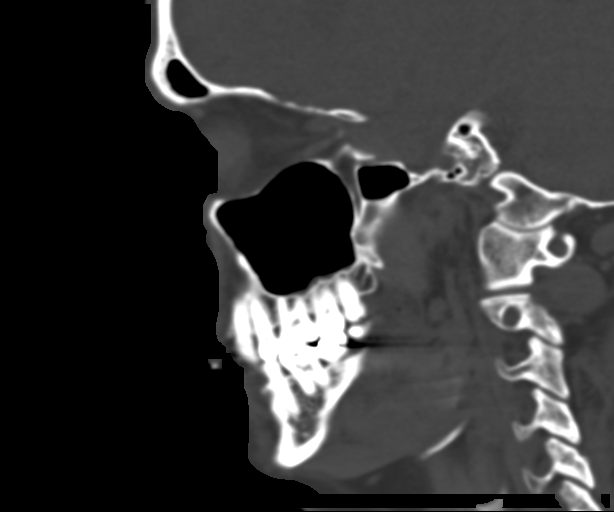
[im 63/76  bone]
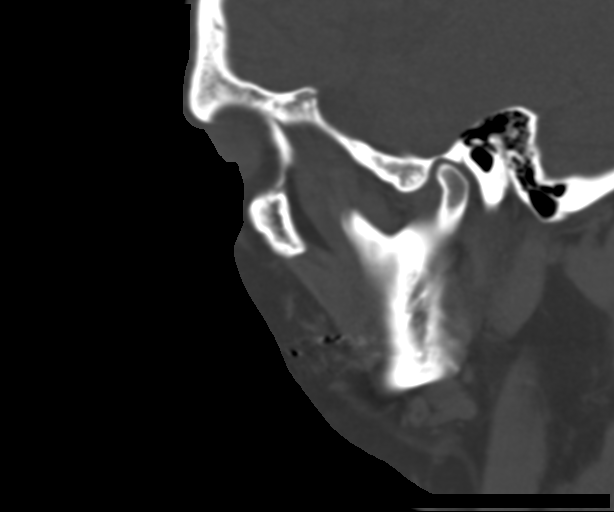

[Series 10: c-spine st · axial · 0.26mm/px · z∈[-106,-4]mm · 3 of 103 slices shown, 4 images]
[im 26/103  soft-tissue]
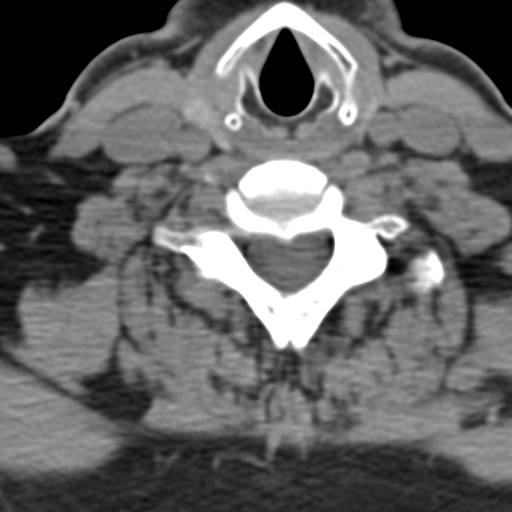
[im 26/103  bone]
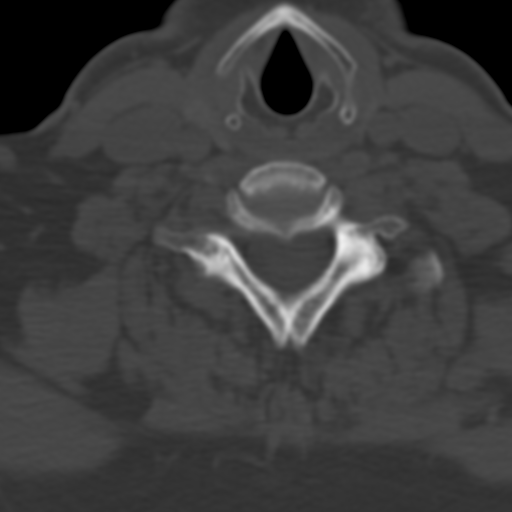
[im 52/103  bone]
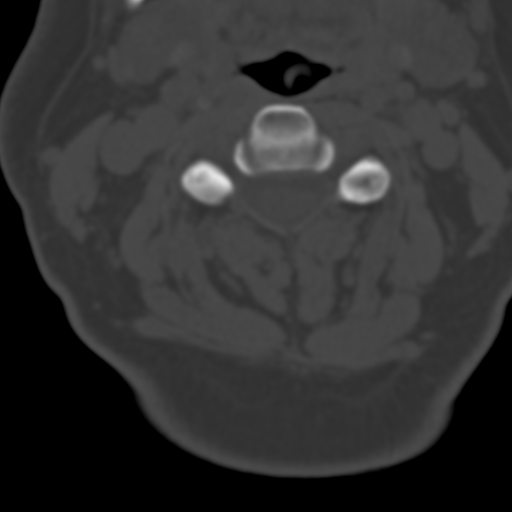
[im 77/103  bone]
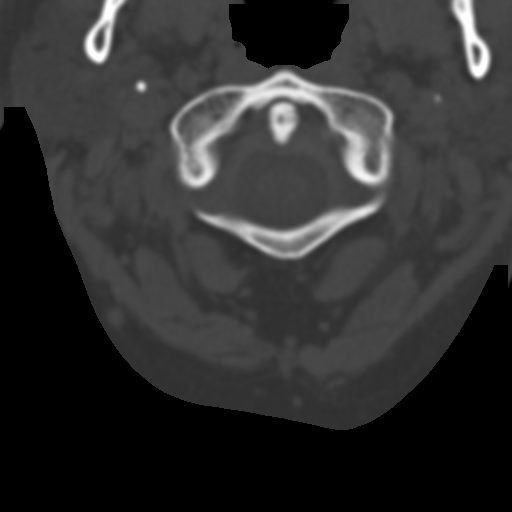

[Series 14: axial · axial · 0.25mm/px · z∈[-129,-42]mm · 3 of 91 slices shown]
[im 23/91  bone]
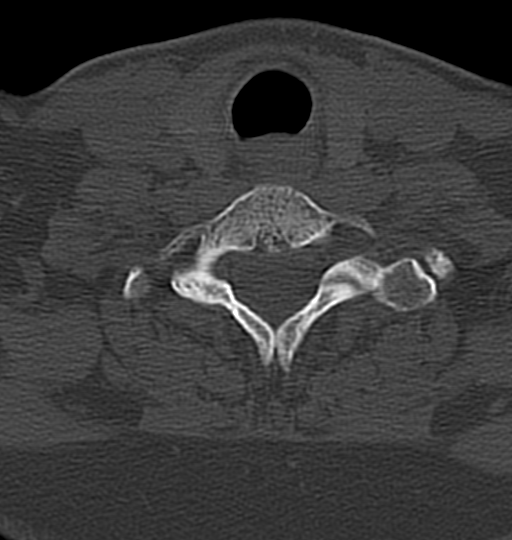
[im 46/91  bone]
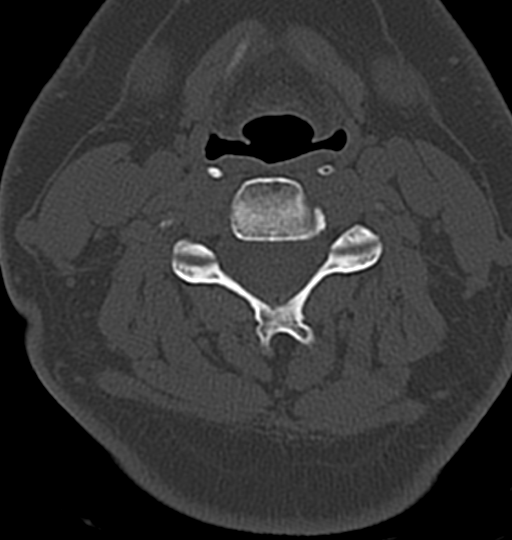
[im 68/91  bone]
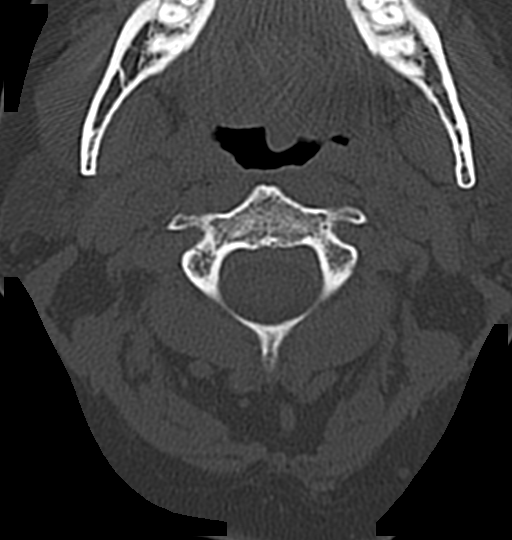

[15 of 33 positions shown; findings below may reference images not displayed]

FINDINGS: CT MAXILLOFACIAL FINDINGS

Osseous: No acute fracture.

Orbits: Within normal.

Sinuses: Minimal mucosal membrane thickening over the right
maxillary sinus. No air-fluid levels.

Soft tissues: Minimal soft tissue swelling over the anterior lateral
right mid face.

Limited intracranial: Within normal.

CT CERVICAL FINDINGS

Alignment: Within normal. Atlantoaxial articulation is within
normal.

Skull base and vertebrae: Very minimal spondylosis, otherwise within
normal.

Soft tissues and spinal canal: Within normal.

Disc levels:  Within normal.

Upper chest: Within normal.
IMPRESSION: No acute facial bone fracture.

Minimal soft tissue swelling over the right anterior lateral mid
face.

No acute cervical spine injury.

Minimal spondylosis of the cervical spine.

Minimal chronic sinus inflammatory change of the right maxillary
sinus.

## 2018-03-20 DIAGNOSIS — F324 Major depressive disorder, single episode, in partial remission: Secondary | ICD-10-CM | POA: Diagnosis not present

## 2018-04-04 DIAGNOSIS — Z23 Encounter for immunization: Secondary | ICD-10-CM | POA: Diagnosis not present

## 2018-04-04 DIAGNOSIS — Z125 Encounter for screening for malignant neoplasm of prostate: Secondary | ICD-10-CM | POA: Diagnosis not present

## 2018-04-04 DIAGNOSIS — Z79899 Other long term (current) drug therapy: Secondary | ICD-10-CM | POA: Diagnosis not present

## 2018-04-04 DIAGNOSIS — F419 Anxiety disorder, unspecified: Secondary | ICD-10-CM | POA: Diagnosis not present

## 2018-04-04 DIAGNOSIS — E782 Mixed hyperlipidemia: Secondary | ICD-10-CM | POA: Diagnosis not present

## 2018-04-05 DIAGNOSIS — E782 Mixed hyperlipidemia: Secondary | ICD-10-CM | POA: Diagnosis not present

## 2018-04-05 DIAGNOSIS — Z79899 Other long term (current) drug therapy: Secondary | ICD-10-CM | POA: Diagnosis not present

## 2018-04-05 DIAGNOSIS — Z125 Encounter for screening for malignant neoplasm of prostate: Secondary | ICD-10-CM | POA: Diagnosis not present

## 2018-04-05 DIAGNOSIS — F419 Anxiety disorder, unspecified: Secondary | ICD-10-CM | POA: Diagnosis not present

## 2018-04-10 ENCOUNTER — Encounter: Payer: Self-pay | Admitting: Psychiatry

## 2018-04-10 ENCOUNTER — Ambulatory Visit: Payer: Medicare HMO | Admitting: Psychiatry

## 2018-04-10 DIAGNOSIS — F331 Major depressive disorder, recurrent, moderate: Secondary | ICD-10-CM | POA: Diagnosis not present

## 2018-04-10 DIAGNOSIS — F4010 Social phobia, unspecified: Secondary | ICD-10-CM

## 2018-04-10 DIAGNOSIS — F422 Mixed obsessional thoughts and acts: Secondary | ICD-10-CM

## 2018-04-10 DIAGNOSIS — G8929 Other chronic pain: Secondary | ICD-10-CM | POA: Insufficient documentation

## 2018-04-10 DIAGNOSIS — M542 Cervicalgia: Secondary | ICD-10-CM

## 2018-04-10 DIAGNOSIS — M5431 Sciatica, right side: Secondary | ICD-10-CM | POA: Insufficient documentation

## 2018-04-10 DIAGNOSIS — M26629 Arthralgia of temporomandibular joint, unspecified side: Secondary | ICD-10-CM | POA: Insufficient documentation

## 2018-04-10 HISTORY — DX: Social phobia, unspecified: F40.10

## 2018-04-10 NOTE — Progress Notes (Signed)
[facility] Counselor/Therapist Progress Note   Patient ID: SANAD FEARNOW, MRN: [id number]  Date: 04/10/2018  Timespent: 60 minutes Start time: 3:15p Stop time: 4:20p  Treatment Type: Individual  Subjective:  Saw PCP a week ago, wants update note from [facility].  Had anxiety about blood draw.  18 lbs better than last year, owing to his stomach flu episode.  Victory in accepting flu shot, first time in 8 or 9 years.  Loss of favorite physical therapist to job change, still trying to maintain PT for back pain, TMJ, deconditioning.  Had a behavioral victory in dealing with a news report on benzodiazepines and tolerance.  Panicky feelings initially, and about 2 days to more fully calm obsessions Talking about it right now causes sweats, too, but reassured in the conversation.  Sparked worry that his own medications, including diazepam, could be harming him.    Irritated by situation with [name]'s college aid and ex-wife's unethical money management.  Noted online banking activity, where a [employer] deposit to [name]'s account, earmarked to repay PT for a school computer purchase, was transferred without asking either son or PT.  Not the first time [name] has raided son's money to solve a Engineer, maintenance (IT), and ore extensive history of "borrowing" from PT without repayment or agreement.  Ongoing situation of ex-wife chronically living beyond her means, having accumulated debts to PT, underscores the wisdom of detaching his own account.  Expects talking with her next 2 days.  Interventions:CBT, Solution Focused and Assertiveness Training  Reviewed problem list from outside sources, culling outdated information and including problems newly defined for [facility].  Processed anxiety over the benzodiazepine story, answering questions about his medicines and their risks, and reassuring that SSRI does not carry the same danger, and he is on a relatively low dose of benzodiazepine.  Discussed coping with  anxiety at the physician's office with special emphasis on self talk after the fact.  Coach PT in recognizing the difference between "near miss" thoughts and "I coped" thoughts and encouraged wherever possible to frame the second._The point by comparing how PT would naturally guide his son were his son struggling with similar phobias.  Discussed at some length the boundary situation with ex-wife and money, framing alternative responses and his willingness to confront, more directly than planned.  Emphasized that PT has a legitimate right to challenge actions against son's welfare and property rights, including his finances, even if his son is nave, and even if the perpetrator is his own mother.  Endorsed how it is okay to simply ask ex-wife if she realizes it was wrong of her to raid her son's money for convenience handling her own debts.  Mental Status Exam:   Appearance:   Casual and Neat     Behavior:  Appropriate  Motor:  Normal  Speech/Language:   Normal Rate and occasional mild stammer  Affect:  Congruent  Mood:  angry, irritable and appropriate to content  Thought process:  Coherent and Occasional mild thought blocking  Thought content:    Logical  Perceptual disturbances:    Normal  Orientation:  Full (Time, Place, and Person)  Attention:  Good  Concentration:  good  Memory:  N/A  Fund of knowledge:   Good  Insight:    Good  Judgment:   Good  Impulse Control:  good    Reported Symptoms:  Worry, anger, fears of contamination and vulnerabiity, ANS arousal, chronic pain  Risk Assessment: Danger to Self:  No  Self-injurious Behavior: No Danger to Others: No Duty to Warn:no Physical Aggression / Violence:No  Access to Firearms a concern: No  Gang Involvement:No   Diagnosis:   ICD-10-CM   1. Mixed obsessional thoughts and acts F42.2    On disability  2. Major depressive disorder, recurrent episode, moderate (HCC) F33.1    On disability  3. Social anxiety disorder F40.10       Plan:  . Use communication advice ad lib to address boundary violations in finances  . Continue taking on occasions to face down phobias, both in medical situations and OCD areas . Make a point of interpreting successful coping rather than successful escape after exposures . Continue to utilize previously learned skills ad lib . Maintain medication, if prescribed, and work faithfully with relevant prescriber(s) . Call the clinic on-call service, present to ER, or call 911 if any life-threatening emergency   [name], PhD

## 2018-05-01 ENCOUNTER — Ambulatory Visit: Payer: Medicare HMO | Admitting: Psychiatry

## 2018-05-01 DIAGNOSIS — F331 Major depressive disorder, recurrent, moderate: Secondary | ICD-10-CM

## 2018-05-01 DIAGNOSIS — F422 Mixed obsessional thoughts and acts: Secondary | ICD-10-CM | POA: Diagnosis not present

## 2018-05-01 DIAGNOSIS — F4010 Social phobia, unspecified: Secondary | ICD-10-CM | POA: Diagnosis not present

## 2018-05-01 NOTE — Progress Notes (Signed)
      [facility] Counselor/Therapist Progress Note   Patient ID: Adam Meyers, MRN: [id number]  Date: 05/01/2018  Start time: 3:33p   Stop time: 4:24p Time Spent: 51 min  Treatment Type: Individual Therapy  Subjective:  Made off er to ex-W to pu son's phone on a discount plan, ostensibly to save money but surreptitiously to ward off the possibility of her missing the bill and getting son's service cut off.  Better to know and monitor and pay for it than leave it to her shaky self-discipline.  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  Able to calm self and stop checking, though likened it to a traumatic event.     Mother often speaks of death and people dying.  On All Saints' Day celebrated the lives of the deceased, felt like an emotional overload.   Greeting time at church was provocative, protected himself by excuses to withdraw from interaction.  Did find himself going the other way later with social exposure to an unfamiliar restaurant, lunching with family.  Continues to deal with small requests, frequent "1 more thing" asks she makes.   Interventions: Cognitive Behavioral Therapy and Solution-Oriented/Positive Psychology Discussed PHI as used and kept in EHR.  Would prefer Problem List not include psychiatric issues.  Affirmed coping with credit card incident and highlighted credit he could take for noticing OCD thought, positive self-talk, coping thoughts and resisting checking impulses until back to baseline.  Affirmed conscious social exposure, reaffirmed the principle of approaching what tends to be avoided.  Mental Status Exam:    Appearance:   Casual and Neat     Behavior:  Appropriate  Motor:  Normal  Speech/Language:   Clear and Coherent  Affect:  Congruent  Mood:  anxious  Thought process:  normal  Thought  content:    WNL  Sensory/Perceptual disturbances:    WNL  Orientation:  WNL  Attention:  Good  Concentration:  Good  Memory:  WNL  Fund of knowledge:   Good  Insight:    Good  Judgment:   Good  Impulse Control:  Fair     Risk Assessment: Danger to Self:  No Self-injurious Behavior: No Danger to Others: No Duty to Warn:no Physical Aggression / Violence:No  Access to Firearms a concern: No  Gang Involvement:No   Diagnosis:   ICD-10-CM   1. Mixed obsessional thoughts and acts F42.2   2. Major depressive disorder, recurrent episode, moderate (HCC) F33.1   3. Social anxiety disorder F40.10     Plan:  . Continue social exposures ad lib . Continue to utilize previously learned skills ad lib . Maintain medication, if prescribed, and work faithfully with relevant prescriber(s) . Call the clinic on-call service, present to ER, or call 911 if any life-threatening emergency . Follow up with me in about 3 weeks Remains disabled from any occupation due to symptom severity.    [name], PhD

## 2018-05-15 ENCOUNTER — Ambulatory Visit: Payer: Medicare HMO | Admitting: Psychiatry

## 2018-05-15 DIAGNOSIS — F422 Mixed obsessional thoughts and acts: Secondary | ICD-10-CM

## 2018-05-15 DIAGNOSIS — F4010 Social phobia, unspecified: Secondary | ICD-10-CM

## 2018-05-15 DIAGNOSIS — F331 Major depressive disorder, recurrent, moderate: Secondary | ICD-10-CM

## 2018-05-15 NOTE — Progress Notes (Signed)
Psychotherapy Progress Note -- [name], PhD, [facility]  Patient ID: Adam Meyers     MRN: [id number]     Date: 05/15/2018     Treatment Type: Individual psychotherapy Start: 3:25p Stop: 4:17p Time Spent: 52 min Accompanied by: none  Self-Report (interim history, self-report of stressors and symptoms, application of prior therapy, status changes) Tired from perturbed sleep, attrib to renovations by two neighbors, with early morning noise.  6 months since F died, M had anniversary-related anxiety and was out of a Rx herself (supply issue with [facility]).  Sleep loss is wearing on his concentration, unable to concentrate well enough to fill out documents (e.g., estate business).  Has worked through the issue of having exposed Financial planner at [employer].  Dealt noncatastrophically with a computer malware scare.  [name] visited 3 day weekend last weekend, pleasant time.  [name] applying to [employer] now, and ex-W hyping the possibility of him going on a school-sponsored [place] trip next fall.  Still at issue are [name]'s sensibilities with money and an unfinished issue with her reneging on a deal to refund PT $1200 for S's computer.  Had to ask her (by text) not to put son in the middle (she had him ask Pt for money for her).  Has tried to confront [name] about earmarking academic support money which may dry up next year (due to residency and federal aid standards) for travel that she herself wants.  First holiday season since son relocated and father died.  Sets up dilemmas where to be and when, and inevitable change of patterns.  Hard to think about, since [name] no longer lives with him and Sharlet Salina is not welcome among PT's family.    Therapies used: Assertiveness/Communication, Ego-Supportive and Psycho-education/Bibliotherapy  Intervention notes: Affirmed assertive chance-taking with ex-wife over matters of money and judgment with it as re. son's welfare and provision.   Discussed sleep regulation, acknowledging it is mainly a matter of temporarily disrupted environment.  Consider whether his usual, intended routine involves too much sleeping, enough to culture depression.  Mental Status/Observations:     Appearance:   Neat     Behavior:  Appropriate  Motor:  Normal  Speech/Language:   Clear and Coherent  Affect:  Restricted  Mood:  anxious  Thought process:  tendency to obsession  Thought content:    Obsessions and otherwise appropriate  Sensory/Perceptual disturbances:    WNL  Orientation:  WNL  Attention:  Good  Concentration:  Good  Memory:  grossly intact  Fund of knowledge:   Good  Insight:    Fair  Judgment:   Fair  Impulse Control:  Good   Risk Assessment: Danger to Self:  No Self-injurious Behavior: No Danger to Others: No Duty to Warn:no Physical Aggression / Violence:No  Access to Firearms a concern: No   Diagnosis:   ICD-10-CM   1. Mixed obsessional thoughts and acts F42.2   2. Major depressive disorder, recurrent episode, moderate (HCC) F33.1   3. Social anxiety disorder F40.10     Progress rating:  Stable  Plan:  . Continue to advocate for son and fair use of money as able Remains disabled from any occupation due to symptom severity (physically, primarily, but also propensity to psychogenic fatigue, on top of body aches, and multiple phobias) . Continue to utilize previously learned skills ad lib . Maintain medication, if prescribed, and work faithfully with relevant prescriber(s) . Call the clinic on-call service, present to ER, or call  911 if any life-threatening emergency . Follow up with me in about 2 weeks  Adam Friesobert Luchiano Viscomi, PhD

## 2018-06-05 ENCOUNTER — Ambulatory Visit: Payer: Medicare HMO | Admitting: Psychiatry

## 2018-06-05 DIAGNOSIS — F4010 Social phobia, unspecified: Secondary | ICD-10-CM

## 2018-06-05 DIAGNOSIS — F422 Mixed obsessional thoughts and acts: Secondary | ICD-10-CM | POA: Diagnosis not present

## 2018-06-05 DIAGNOSIS — F331 Major depressive disorder, recurrent, moderate: Secondary | ICD-10-CM

## 2018-06-05 NOTE — Progress Notes (Signed)
Psychotherapy Progress Note -- Marliss CzarAndy Frans Valente, PhD, [facility]  Patient ID: Adam Meyers     MRN: [id number]     Date: 06/05/2018     Therapy format: Individual psychotherapy Start: 3:20p Stop: 4:10p Time Spent: 50 min Accompanied by: none  Session narrative -- interim history, self-report of stressors and symptoms, applications of prior therapy, status changes, and interventions in session Hip hurting, out of PT a month since favorite therapist relocated and office process lost tract of a scheduling request.  Tedious work filling out estate-related forms and coordinating with sister for paperwork that needed both of them.  Tasks in general are still exhausting, and 5 days of things to get out for is enervating.  Overcast weather following it hit hard, required nap of 1-1.5 hrs to recover for dinner.    Discussed possibility of fibromyalgia, which was being investigated at the time he fell ill several years ago.  Got tired of inconclusive assessments, evaluations, doctors, referrals, and also had reactions to two SNRIs that spiked his alarm about iatrogenic harm.  Says scans at the time were negative for brain or cervical malformations.  Never got rheumatological testing.  Admits that he was leery of moving after pain syndrome began, could have tigthened up the musculature, and has felt more hidebound and like an old man ever since.  The circus of referrals one doctor to another, going through theories of hernia and other things, compounded by chiropractor working on sciatica, taking it on himself to treat jaw tightness, reportedly injuring his TM joint and causing killer headaches.  All of which was followed by self-recriminations for "letting" the chiropractor do that.    Encouraged continue PT, which he will.  Does not do home exercises, has misplaced the papers  And been preoccupied with other tasks.  Also admits passivity about treatment working for him without working his physical  status.  Recently [name] called, $400 short on the rent, another time $150 short on car insurance, another time short $30-40 for food, asking for funds.  Gave it each time because couldn't bear the thought of son losing place to live or good fit at [employer].  Still finding that ex-wife transfers money from son's account into hers, not sure why, has had son say "we" needed it for something besides the intended purpose.  Has once told ex-W not to put Clifton Custardaron in the middle, but he is   Confronted with need to confront ex with the objective need for her to account for her needs, and why she is not lving up to the agreement that she could and would make ends meet on her own.  Advised against weaker points, e.g., not "having" the money, asking of-center questions whether she's shopped for a more frugal apartment or car, or indulging any tit-for-tat about unfairness or guilt-tripping about [name]'s welfare and stick to the principle: "You told me you could manage.  Why isn't it working?"  If faced with manipulative arguments, like her "safety" (living in a semi-luxurious apartment) or son's wellbeing (needs to be here to do OK in school), call the act of manipulation what it is -- an attempt to get him to take responsibility for her own unwise spending, and if need be, point out how it is the same thing unmarried as happened married.  PT very uncomfortable at the prospect, but will try to imagine, practice, and become willing to press accountability.  Confronted how he has assumed the extent of [name]'s hardship  and overfocused on feeling outnumbered, when the situation demands that he stand on the principles of fair play, refuse to indulge her, and. If necessary, call her out for pilfering son's money and welshing on loans he agreed to give her for understandable medical expenses.  Modelled these communications and posed the "thinker" question, "[name], if you didn't have me to turn to, what would you  do?"  Therapeutic modalities: Cognitive Behavioral Therapy and Assertiveness/Communication  Mental Status/Observations:  Appearance:   Casual and Neat     Behavior:  Appropriate  Motor:  Normal; typical need to urinate once during session  Speech/Language:   Clear and Coherent  Affect:  Appropriate and tense  Mood:  anxious and constricted  Thought process:  normal except rigidity with anxious topics  Thought content:    WNL and worries  Sensory/Perceptual disturbances:    WNL  Orientation:  WNL  Attention:  Fair  Concentration:  Good  Memory:  intact  Insight:    Fair  Judgment:   Good  Impulse Control:  Good   Risk Assessment: Danger to Self:  No Self-injurious Behavior: No Danger to Others: No Duty to Warn:no Physical Aggression / Violence:No  Access to Firearms a concern: No   Diagnosis:   ICD-10-CM   1. Mixed obsessional thoughts and acts F42.2   2. Major depressive disorder, recurrent episode, moderate (HCC) F33.1    With suspected fibromyalgia  3. Social anxiety disorder F40.10     Assessment of progress:  stable, chronically conflict-avoidant with gradual, painstaking progress  Plan:  . Imagine, practice principled confrontation of ex-wife about money . Continue to utilize previously learned skills ad lib . Maintain medication, if prescribed, and work faithfully with relevant prescriber(s) . Call the clinic on-call service, present to ER, or call 911 if any life-threatening emergency . Follow up with me in about 3-4 weeks due to holiday  [name], PhD

## 2018-06-18 ENCOUNTER — Encounter: Payer: Self-pay | Admitting: Emergency Medicine

## 2018-06-18 DIAGNOSIS — F429 Obsessive-compulsive disorder, unspecified: Secondary | ICD-10-CM

## 2018-06-21 ENCOUNTER — Other Ambulatory Visit: Payer: Self-pay

## 2018-06-21 MED ORDER — FLUVOXAMINE MALEATE 100 MG PO TABS: ORAL_TABLET | ORAL | 0 refills | Status: DC

## 2018-06-27 ENCOUNTER — Other Ambulatory Visit: Payer: Self-pay | Admitting: Psychiatry

## 2018-06-27 ENCOUNTER — Other Ambulatory Visit: Payer: Self-pay

## 2018-06-27 MED ORDER — DIAZEPAM 5 MG PO TABS: ORAL_TABLET | ORAL | 0 refills | Status: DC

## 2018-06-27 NOTE — Telephone Encounter (Signed)
Refill request on valium 5mg  1 tablet tid prn anxiety Last fill PMP 03/25/2018 #90 Request sent to provider

## 2018-06-27 NOTE — Telephone Encounter (Signed)
Need to review paper chart  

## 2018-06-28 ENCOUNTER — Ambulatory Visit: Payer: Medicare HMO | Admitting: Psychiatry

## 2018-06-28 DIAGNOSIS — F4010 Social phobia, unspecified: Secondary | ICD-10-CM | POA: Diagnosis not present

## 2018-06-28 DIAGNOSIS — F422 Mixed obsessional thoughts and acts: Secondary | ICD-10-CM | POA: Diagnosis not present

## 2018-06-28 DIAGNOSIS — F331 Major depressive disorder, recurrent, moderate: Secondary | ICD-10-CM

## 2018-06-28 NOTE — Progress Notes (Signed)
Psychotherapy Progress Note -- Marliss CzarAndy Scotti Kosta, PhD, [facility]  Patient ID: Adam LowJames K Vanengen     MRN: [id number]     Date: 06/28/2018     Therapy format: Individual psychotherapy Start: 3:23p Stop: 4:15p Time Spent: 52 min Accompanied by: none  Session narrative -- interim history, self-report of stressors and symptoms, applications of prior therapy, status changes, and interventions in session Mother fell on Sunday, required ED trip, right before he was going to bed.  Son Clifton Custardaron visited.  Did find out she was dehydrated, small pneumonia, UTI.  Has home health coming now, with PT assessment and weekly nursing checks.  PT now on duty to fix her breakfasts until she is strong enough to do the steps at home.  Has to cut food for her, finds it tedious and unwanted doing personal service work, but trying to put up with it.  Re. holidays, really enjoyed the big family gathering (and feeling he had more company caring for mother),  [place] MalayXmas Eve and Day complicated by [name] being there 12/24, felt awkward sitting together at church, [place] [facility] [place] and sent him back by train.  Sister foisted wrapping and cooking duties onto him, saw them through through.  Gift-giving Xmas Day threw mother into grief when father's collection of quarters got divvied out to grandkids. Several money requests in December from AthertonDodie, short notice, following a string of unwise spending decisions and a complicated series of eye procedures.  Has granted some of them.  Successfully advised son to leave gift money with PT rather than take them home and let them be susceptible to his mother pilfering them.  Let him know he is not responsible for floating her finances, partly in light of her having taken money from his wallet.  Had uncomfortable but frank and honest conversation with Clifton Custardaron about his mother violating his money.  Wonders if he did the right thing -- endorsed readily as truth-telling, reality lessons his son  unfortunately needs, and tough love for ex-wife's addicitve behavior.  Progress in confronting financial boundaries with [name], at least asking why for every request and deciding a $$ limit.  Therapeutic modalities: Cognitive Behavioral Therapy, Assertiveness/Communication and Ego-Supportive  Mental Status/Observations:  Appearance:   Casual and Neat     Behavior:  Appropriate  Motor:  Normal and except tension in neck, upper back movements (pain + anx)  Speech/Language:   Clear and Coherent  Affect:  Appropriate  Mood:  anxious  Thought process:  normal  Thought content:    WNL  Sensory/Perceptual disturbances:    WNL  Orientation:  WNL  Attention:  Good  Concentration:  Good  Memory:  WNL  Insight:    Good  Judgment:   Fair  Impulse Control:  Fair   Risk Assessment: Danger to Self:  No Self-injurious Behavior: No Danger to Others: No Duty to Warn:no Physical Aggression / Violence:No  Access to Firearms a concern: No   Diagnosis:   ICD-10-CM   1. Mixed obsessional thoughts and acts F42.2   2. Major depressive disorder, recurrent episode, moderate (HCC) F33.1   3. Social anxiety disorder F40.10    Assessment of progress:  stable  Plan:  . Continue asserting financial boundaries with ex-W ad lib, asking for explanations ad lib.  May return to further assertiveness tactics. . Continue plans to relocate from mother's house as able . Continue self-paced exposure to anxiety/OCD provoking scenarios ad lib Remains disabled from any occupation due to symptom  severity. . Continue to utilize previously learned skills ad lib . Maintain medication, if prescribed, and work faithfully with relevant prescriber(s) . Call the clinic on-call service, present to ER, or call 911 if any life-threatening emergency . Follow up with me in about 2-3 wks  Robley Friesobert Linzy Laury, PhD [place] Licensed Psychologist

## 2018-07-10 ENCOUNTER — Ambulatory Visit: Payer: Medicare HMO | Admitting: Psychiatry

## 2018-07-10 ENCOUNTER — Encounter: Payer: Self-pay | Admitting: Psychiatry

## 2018-07-10 DIAGNOSIS — F331 Major depressive disorder, recurrent, moderate: Secondary | ICD-10-CM | POA: Diagnosis not present

## 2018-07-10 DIAGNOSIS — F422 Mixed obsessional thoughts and acts: Secondary | ICD-10-CM

## 2018-07-10 DIAGNOSIS — F4010 Social phobia, unspecified: Secondary | ICD-10-CM | POA: Diagnosis not present

## 2018-07-10 NOTE — Progress Notes (Signed)
LEMOYNE CHARPING [id number] [birth year] 58 y.o.  Subjective:   Patient ID:  Adam Meyers is a 58 y.o. (DOB 01-16-1961) male.  Chief Complaint:  Chief Complaint  Patient presents with  . Follow-up    Medication Management  . Anxiety  . Stress    care taking    HPI  Last seen August Adam Meyers presents to the office today for follow-up of OCD and SE complaints.  Moved in with M and she fell and hospitalized.  Pt handled that trigger pretty well RE: OCD.  May have to do hygiene for mother and doesn't think he can handle that re obsessions.  Sister has been doing this.  Constant care for mother.  Has been to church with mother.  Not depressed.  But depressing living with mother.  Tired caring for her.  Residual anxiety.  Emotional Xmas without F who died in 2022-12-06. Pt reports that mood is Anxious and describes anxiety as Moderate. Anxiety symptoms include: Excessive Worry, Obsessive Compulsive Symptoms:   Handwashing,,. Pt reports no sleep issues  Diazepam helps turn off thoughts at night.. Pt reports that appetite is good. Pt reports that energy is no change and good. Concentration is good. Suicidal thoughts:  denied by patient.  Failed Previous Psychiatric Trials include: Pindolol, Luvox and Paxil sexual SE, propranolol, diazepam, mirtazapine    Review of Systems:  Review of Systems  Neurological: Negative for tremors and weakness.  Psychiatric/Behavioral: Negative for agitation, behavioral problems, confusion, decreased concentration, dysphoric mood, hallucinations, self-injury and sleep disturbance. The patient is nervous/anxious. The patient is not hyperactive.     Medications: I have reviewed the patient's current medications.  Current Outpatient Medications  Medication Sig Dispense Refill  . calcium-vitamin D (OSCAL WITH D) 500-200 MG-UNIT tablet Take 1 tablet by mouth.    . diazepam (VALIUM) 5 MG tablet TAKE 1 TABLET BY MOUTH 3 TIMES A DAY AS NEEDED FOR ANXIETY 90 tablet 0  .  famotidine (PEPCID) 20 MG tablet Take 20 mg by mouth daily as needed for heartburn or indigestion.    . fluvoxaMINE (LUVOX) 100 MG tablet Take 1/2 tablet in the morning and 1.5 tablets at bedtime. (Patient taking differently: Take 1/2 tablet at lunch and 1 at HS.) 180 tablet 0  . ibuprofen (ADVIL) 200 MG tablet Take 600 mg by mouth every 8 (eight) hours as needed for moderate pain.      No current facility-administered medications for this visit.     Medication Side Effects: None  Allergies:  Allergies  Allergen Reactions  . Oxycodone-Acetaminophen Itching  . Seasonal Ic [Cholestatin]     Drainage,itchy eyes  . Gabapentin Anxiety  . Percocet [Oxycodone-Acetaminophen] Itching and Anxiety    Past Medical History:  Diagnosis Date  . Compressed cervical disc   . Cough   . Depression   . Hyperlipidemia   . Nasal congestion   . Social anxiety disorder 04/10/2018    Family History  Problem Relation Age of Onset  . Cancer Maternal Grandmother        melanoma    Social History   Socioeconomic History  . Marital status: Married    Spouse name: Not on file  . Number of children: Not on file  . Years of education: Not on file  . Highest education level: Not on file  Occupational History  . Not on file  Social Needs  . Financial resource strain: Not on file  . Food insecurity:    Worry: Not on  file    Inability: Not on file  . Transportation needs:    Medical: Not on file    Non-medical: Not on file  Tobacco Use  . Smoking status: Never Smoker  . Smokeless tobacco: Never Used  Substance and Sexual Activity  . Alcohol use: No  . Drug use: No  . Sexual activity: Not on file  Lifestyle  . Physical activity:    Days per week: Not on file    Minutes per session: Not on file  . Stress: Not on file  Relationships  . Social connections:    Talks on phone: Not on file    Gets together: Not on file    Attends religious service: Not on file    Active member of club or  organization: Not on file    Attends meetings of clubs or organizations: Not on file    Relationship status: Not on file  . Intimate partner violence:    Fear of current or ex partner: Not on file    Emotionally abused: Not on file    Physically abused: Not on file    Forced sexual activity: Not on file  Other Topics Concern  . Not on file  Social History Narrative  . Not on file    Past Medical History, Surgical history, Social history, and Family history were reviewed and updated as appropriate.   Please see review of systems for further details on the patient's review from today.   Objective:   Physical Exam:  There were no vitals taken for this visit.  Physical Exam Constitutional:      General: He is not in acute distress.    Appearance: He is well-developed.  Musculoskeletal:        General: No deformity.  Neurological:     Mental Status: He is alert and oriented to person, place, and time.     Motor: No tremor.     Coordination: Coordination normal.     Gait: Gait normal.  Psychiatric:        Attention and Perception: Attention and perception normal.        Mood and Affect: Mood normal. Mood is not anxious or depressed. Affect is not labile, blunt, angry or inappropriate.        Speech: Speech normal.        Behavior: Behavior normal.        Thought Content: Thought content normal. Thought content does not include homicidal or suicidal ideation. Thought content does not include homicidal or suicidal plan.     Comments: Insight and judgment fair. No auditory or visual hallucinations. No delusions.  Residual sig obsessions compulsions.     Lab Review:  No results found for: NA, K, CL, CO2, GLUCOSE, BUN, CREATININE, CALCIUM, PROT, ALBUMIN, AST, ALT, ALKPHOS, BILITOT, GFRNONAA, GFRAA  No results found for: WBC, RBC, HGB, HCT, PLT, MCV, MCH, MCHC, RDW, LYMPHSABS, MONOABS, EOSABS, BASOSABS  No results found for: POCLITH, LITHIUM   No results found for: PHENYTOIN,  PHENOBARB, VALPROATE, CBMZ   .res Assessment: Plan:    Mixed obsessional thoughts and acts  Social anxiety disorder  Major depressive disorder, recurrent episode, moderate (HCC)  Adam Meyers has had very severe OCD that has been disabling.  He has had significant improvement through counseling with Dr. Farrel DemarkMitchum.  The medications have been helpful as well.  He is reluctant to take the full recommended dose of SSRI because of sexual side effects.  Therefore we will continue the current dosage.  Continue counseling  Caretaking stress.  Not a good reason to reduce meds.  He doesn't want to take more meds either.  He probably could benefit for anxiety with more meds but doesn't want to do it.  We discussed the short-term risks associated with benzodiazepines including sedation and increased fall risk among others.  Discussed long-term side effect risk including dependence, potential withdrawal symptoms, and the potential eventual dose-related risk of dementia.  FU 4 mos  Meredith Staggers, MD, DFAPA  Please see After Visit Summary for patient specific instructions.  Future Appointments  Date Time Provider Department Center  07/17/2018  3:00 PM [name], PhD [facility] None  08/07/2018  3:00 PM [name], PhD [facility] None    No orders of the defined types were placed in this encounter.     -------------------------------

## 2018-07-17 ENCOUNTER — Ambulatory Visit: Payer: Medicare HMO | Admitting: Psychiatry

## 2018-07-17 DIAGNOSIS — F422 Mixed obsessional thoughts and acts: Secondary | ICD-10-CM

## 2018-07-17 DIAGNOSIS — F331 Major depressive disorder, recurrent, moderate: Secondary | ICD-10-CM | POA: Diagnosis not present

## 2018-07-17 DIAGNOSIS — G9332 Myalgic encephalomyelitis/chronic fatigue syndrome: Secondary | ICD-10-CM | POA: Insufficient documentation

## 2018-07-17 DIAGNOSIS — R5382 Chronic fatigue, unspecified: Secondary | ICD-10-CM | POA: Diagnosis not present

## 2018-07-17 DIAGNOSIS — F4010 Social phobia, unspecified: Secondary | ICD-10-CM

## 2018-07-17 NOTE — Addendum Note (Signed)
Addended by: [name] A on: 07/17/2018 05:01 PM   Modules accepted: Level of Service

## 2018-07-17 NOTE — Progress Notes (Signed)
Psychotherapy Progress Note [facility]  Patient ID: GAVRIEL MARGESON     MRN: [id number]      Therapy format: Individual psychotherapy Date: 07/17/2018     Start: 3:21p Stop: 4:12p Time Spent: 51 min  Session narrative -- presenting needs, interim history, self-report of stressors and symptoms, applications of prior therapy, status changes, and interventions in session Worn out from a long doctor visit with mother yesterday.  She has been having brain fog while recovering from a fall, now suspecting UTI.  Knows she was dehydrated, though UTI negative at time of ED trip.  Yesterday positive for UTI.  Intense experience for PT filling out detailed info and spending that much time up at physician's office.  Concerns for her dependence on Xanax, PCP Rx'd low-dose Zoloft for her.  Wearing on him to run errands, have the after-care discussion, wait again at pharmacy.  Working home exercises with her, noticing more elementary mental malfunctions, like left-right confusion and mixing up information a la dementia.  Appreciates astute observations of the visiting PT.    Had anxiety challenge with mother suffering diarrhea away from bathroom, needing help bathing -- extremely provocative, called sister.  Sister was out with daughter and miffed but came, washed up mother, then sparked up a conflict about who should be the one to do it.  Unresolved, but PT stated firmly it would be "inappropriate" for him, which only led sister to tell him it's not sexual, both missing the point.  Debriefed with PT and confronted his tendency to obfuscate, officiate, and disguise the real issue, which is his intense, sensory reaction and anxious aversion.  Has been afraid that being open about that would just get him called weak, puny, etc, since he did once get told she thought he was "tougher than that."  Acknowledged insensitivity and stigma, but reframed it as the God's honest truth, valid regardless of whether he  gets results or consideration, and disguising it only makes it look like he's manipulating and does not actually spare any conflict, just moves it into another lane and last longer.  Far better to say it straight (and be vulnerable) and let it be his best chance of empathy.  May still be a negotiation who does personal care and first aid (perhaps him, because he is present), but at least it cuts down the ill will and makes a more honorable memory.  Drawing on his faith, reframed it as, in [place], his character and sister's sin if he lays it out what it is like for him to encounter his mother naked and sister still dismisses him.  Therapeutic modalities: Cognitive Behavioral Therapy, Assertiveness/Communication and faith-sensitive  Mental Status/Observations:  Appearance:   Casual and Neat     Behavior:  Appropriate  Motor:  Normal  Speech/Language:   Clear and Coherent  Affect:  Appropriate and fuller range  Mood:  tired, responsive to TX  Thought process:  normal and exc. obsessive content  Thought content:    WNL and relatively mild obsesisons  Sensory/Perceptual disturbances:    WNL  Orientation:  WNL  Attention:  Good  Concentration:  Good  Memory:  WNL  Insight:    Fair  Judgment:   Good  Impulse Control:  Good   Risk Assessment: Danger to Self:  No Self-injurious Behavior: No Danger to Others: No Duty to Warn:no Physical Aggression / Violence:No  Access to Firearms a concern: No   Diagnosis:   ICD-10-CM   1. Mixed  obsessional thoughts and acts F42.2   2. Major depressive disorder, recurrent episode, moderate (HCC) F33.1   3. Social anxiety disorder F40.10   4. 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 Recommendations/advice as noted above, particularly changing conflict approach with sister Remains disabled from any occupation due to symptom  severity. . Continue to utilize previously learned skills ad lib . Maintain medication, if prescribed, and work faithfully with relevant prescriber(s) . Call the clinic on-call service, present to ER, or call 911 if any life-threatening emergency Return in about 3 weeks (around 08/07/2018).   [name], PhD [place] Licensed Psychologist

## 2018-07-26 ENCOUNTER — Encounter: Payer: Self-pay | Admitting: Psychiatry

## 2018-07-26 NOTE — Progress Notes (Signed)
Patient ID: Adam Meyers, male   DOB: 1960/12/10, 58 y.o.   MRN: [id number]  TC received from PT, who is scheduled about 3 weeks out.  Received jury summons for early March, anxiously apprehensive, asking for excusal letter.  Letter prepared, charge per normal schedule, staff to mail on Monday.  Cc to paper chart.  [name], PhD

## 2018-08-07 ENCOUNTER — Ambulatory Visit: Payer: Medicare HMO | Admitting: Psychiatry

## 2018-08-15 ENCOUNTER — Ambulatory Visit: Payer: Medicare HMO | Admitting: Psychiatry

## 2018-08-15 DIAGNOSIS — F331 Major depressive disorder, recurrent, moderate: Secondary | ICD-10-CM | POA: Diagnosis not present

## 2018-08-15 DIAGNOSIS — R5382 Chronic fatigue, unspecified: Secondary | ICD-10-CM

## 2018-08-15 DIAGNOSIS — G9332 Myalgic encephalomyelitis/chronic fatigue syndrome: Secondary | ICD-10-CM

## 2018-08-15 DIAGNOSIS — Z636 Dependent relative needing care at home: Secondary | ICD-10-CM

## 2018-08-15 DIAGNOSIS — F422 Mixed obsessional thoughts and acts: Secondary | ICD-10-CM

## 2018-08-15 DIAGNOSIS — Z630 Problems in relationship with spouse or partner: Secondary | ICD-10-CM | POA: Diagnosis not present

## 2018-08-15 DIAGNOSIS — F4010 Social phobia, unspecified: Secondary | ICD-10-CM

## 2018-08-15 NOTE — Progress Notes (Signed)
Psychotherapy Progress Note [facility] [name], PhD LP  Patient ID: Adam Meyers     MRN: [id number]     Therapy format: Individual psychotherapy Date: 08/15/2018     Start: 3:22p Stop: 4:12p Time Spent: 50 min  Session narrative -- presenting needs, interim history, self-report of stressors and symptoms, applications of prior therapy, status changes, and interventions made in session Got jury summons (see other documentation), sent off letter yesterday.  Mother's worry and all the follow up care for her fall are stressful to PT.  They do have 2 days/wk of 3-hr home care for bathing and laundry, food prep for her swallowing problem.  In-home therapies coming to mother, too.  Mother has had repeat UTIs involving foggy mental status, jitters, even hallucinations at one point.  Discussed longterm experience of mother being indirect, needy, panicky, resorting to guilt tactics, and how the experience has tightened up his reactions.to her annoying behaviors.  Did try to orient his sister to his blood phobia and was able to deliver matter-of-factly; she seemed to understand until some days later, when she called PT a "baby" about it.  Seems clear from description that she is hardhearted about his (nonapparent) disability and prone to resent all suggestion of her taking care of things that any other normally able-bodied person should be able to, hypothetically.  Helpful to have a private-duty med tech, [id number].  PT asserts the situation is affordable, ethical, consented to by mother, and good rapport, even if judged by sister as PT just making it cushy for himself.  Meanwhile, ex-wife temporarily asked PT to cover a months' rent for her (nearly $1400), since she has spent herself into debt and then incurred emergency eye surgery.  This follows history of repeatedly asking loans and not paying them back, and evidence she raided their son's account to pay for her own excesses.  PT  responded by asking if she needed it all, saying he can't afford that, counteroffer for her to incur a late fee and he would cover it instead; ex-W backed off, said she would handle it.  No acrimony, though PT did wonder, and have abiding sense that something would go wrong or come to anger and continued to obsess about it.  Made a point in session of showing him that he handled it assertively enough, rather than giving in and vacating reserve money, and that he made her think instead of letting it default to a battle of wills which history says he would almost inevitably lose.  Reinforced a "parenting" perspective, where, when faced with childlike behavior, he helped her develop instead of trying to discipline an adult.  Advocated thinking questions wherever possible, e.g., "How would you handle it if I wasn't available?"  Discussed continuing risk of her raiding son's account, dedicated to education expenses, to float her own finances.  Also encouraged that he may always be faced with having to speak the hard truths that (1) they are divorced, and (2) she has made and broken promises to repay before, both of which are inherently good enough reasons not to approve new money, and that if things seem to mean his son is a Lexicographer" in negotiations, she should at least have to say it openly, not leave it to his imagination and OCD to presume it and give in.  Therapeutic modalities: Cognitive Behavioral Therapy, Assertiveness/Communication and Ego-Supportive  Mental Status/Observations:  Appearance:   Casual     Behavior:  Appropriate  Motor:  Normal and except tense movement  Speech/Language:   Clear and Coherent  Affect:  Appropriate and tense with subject of confrontation  Mood:  anxious and moderately  Thought process:  normal and some obsession  Thought content:    Obsessions  Sensory/Perceptual disturbances:    WNL  Orientation:  WNL  Attention:  Good  Concentration:  Good  Memory:  WNL   Insight:    Good  Judgment:   Good  Impulse Control:  Good   Risk Assessment: Danger to Self:  No Self-injurious Behavior: No Danger to Others: No Duty to Warn:no Physical Aggression / Violence:No  Access to Firearms a concern: No   Diagnosis:   ICD-10-CM   1. Mixed obsessional thoughts and acts F42.2    largely about contamination including blood phobia  2. Major depressive disorder, recurrent episode, moderate (HCC) F33.1   3. Relationship problem between partners Z63.0   4. Chronic fatigue syndrome R53.82   5. Social anxiety disorder F40.10   6. Caregiver stress Z63.6     Assessment of progress:  stable  Plan:  . Imaginal practice confronting ex-wife more assertively in case of need . Imaginal practice asking sister, more pointedly, if she means she doesn't believe he has physical and neurological limitations in carrying out mother's care, or if she actually believes it's easy . Continue to utilize previously learned skills ad lib . Maintain medication as prescribed and work faithfully with relevant prescriber(s) if any changes are desired or seem indicated . Call the clinic on-call service, present to ER, or call 911 if any life-threatening emergency Return in about 2 weeks (around 08/29/2018) for as scheduled already.   [name], PhD [place] Licensed Psychologist

## 2018-08-28 ENCOUNTER — Ambulatory Visit: Payer: Medicare HMO | Admitting: Psychiatry

## 2018-08-28 DIAGNOSIS — R5382 Chronic fatigue, unspecified: Secondary | ICD-10-CM

## 2018-08-28 DIAGNOSIS — F4010 Social phobia, unspecified: Secondary | ICD-10-CM | POA: Diagnosis not present

## 2018-08-28 DIAGNOSIS — F422 Mixed obsessional thoughts and acts: Secondary | ICD-10-CM

## 2018-08-28 DIAGNOSIS — Z636 Dependent relative needing care at home: Secondary | ICD-10-CM

## 2018-08-28 DIAGNOSIS — Z630 Problems in relationship with spouse or partner: Secondary | ICD-10-CM | POA: Diagnosis not present

## 2018-08-28 DIAGNOSIS — G9332 Myalgic encephalomyelitis/chronic fatigue syndrome: Secondary | ICD-10-CM

## 2018-08-28 NOTE — Progress Notes (Signed)
Psychotherapy Progress Note [facility] [name], PhD LP  Patient ID: Adam Meyers     MRN: [id number]     Therapy format: Individual psychotherapy Date: 08/28/2018     Start: 3:20p Stop: 4:08p Time Spent: 48 min  Session narrative -- presenting needs, interim history, self-report of stressors and symptoms, applications of prior therapy, status changes, and interventions made in session Got jury excusal.  Had occasion with sister Adam Meyers to discuss doing mother's wound care (forehead, skin graft) when [place] begged off.  No [place] available, PT forced to deal with it, which he did, albeit with prayer, anxiety, and gloves.  Conflict with sister the next day where he got her to hear that she had agreed to do this procedure, which is phobic for him, but sister told him he just needs a spine.  Uncertain whether he got his point across about reneging on the agreement, but believes mother conveyed it for him, as Adam Meyers has been regular since.  Discussed tactics and pitfalls in communicating with her, eventually recognizing that sister is emotophobic, probably based on her own history of breakdown (anxiety, anorexia, depression).  Encouraged to make sure he doesn't get sidetracked defending his perceived needs or arguing unfairness but presses instead for fair process, that they agree who does what and talk over changes rather than take things for granted.  C/o burning pain in neck leaning over to do mother's wound.  Apparently spinal issues more involved than apparent.  C/o barely being able to keep up with admin tasks, and having to forgo physical therapy because of the caregiving load.  Agreed it's another reason to broker clearer agreement with sister about caregiving loads.  Speech therapy helping mother not only with speech but with organization and anxiety.    Seeing more of son Adam Meyers, last weekend and this weekend.  Responsibility talk with him about managing his phone service,  which was running short, only to find out [name] eliminated TV and Internet to cut costs.  Also seems to be skimping on food budget to keep from asking PT to borrow money.  Agreed this is a good sign in ex-wife facing realities and managing budget, and son has sufficient telecom options at the apartment complex.  Re. OCD, notes that he has come a long way from beginning treatment, when the sight of an electronic stylus at the front desk would have precipitated a panic attack.  Able to use it without distress or urgent decontamination now.  Therapeutic modalities: Cognitive Behavioral Therapy and Assertiveness/Communication  Mental Status/Observations:  Appearance:   Casual and Neat     Behavior:  Appropriate  Motor:  Normal  Speech/Language:   Clear and Coherent  Affect:  Appropriate and Restricted  Mood:  anxious and less  Thought process:  normal  Thought content:    WNL and not overly preoccupied/obsessed but aware of contamination triggers  Sensory/Perceptual disturbances:    WNL  Orientation:  WNL  Attention:  Good  Concentration:  Good  Memory:  WNL  Insight:    Good  Judgment:   Good  Impulse Control:  Good   Risk Assessment: Danger to Self:  No Self-injurious Behavior: No Danger to Others: No Duty to Warn:no Physical Aggression / Violence:No  Access to Firearms a concern: No   Diagnosis:   ICD-10-CM   1. Mixed obsessional thoughts and acts F42.2   2. Caregiver stress Z63.6   3. Chronic fatigue syndrome R53.82   4. Social anxiety disorder  F40.10   5. Relationship problem between partners Z63.0     Assessment of progress:  stable  Plan:  . Communication -- emphasize back-to-the-point and not getting sidetracked with sister . Encourage return to physical therapy as able . Continuing ad lib exposure and response prevention where it surfaces . Remains disabled from any occupation due to primarily physical symptoms and limitations and limited distress  tolerance. . Continue to utilize previously learned skills ad lib . Maintain medication as prescribed and work faithfully with relevant prescriber(s) if any changes are desired or seem indicated . Call the clinic on-call service, present to ER, or call 911 if any life-threatening emergency Return in about 3 weeks (around 09/18/2018).   [name], PhD [place] Licensed Psychologist

## 2018-09-17 ENCOUNTER — Other Ambulatory Visit: Payer: Self-pay | Admitting: Psychiatry

## 2018-09-18 ENCOUNTER — Ambulatory Visit (INDEPENDENT_AMBULATORY_CARE_PROVIDER_SITE_OTHER): Payer: Medicare HMO | Admitting: Psychiatry

## 2018-09-18 ENCOUNTER — Other Ambulatory Visit: Payer: Self-pay

## 2018-09-18 DIAGNOSIS — G9332 Myalgic encephalomyelitis/chronic fatigue syndrome: Secondary | ICD-10-CM

## 2018-09-18 DIAGNOSIS — R5382 Chronic fatigue, unspecified: Secondary | ICD-10-CM

## 2018-09-18 DIAGNOSIS — F422 Mixed obsessional thoughts and acts: Secondary | ICD-10-CM | POA: Diagnosis not present

## 2018-09-18 DIAGNOSIS — Z638 Other specified problems related to primary support group: Secondary | ICD-10-CM | POA: Diagnosis not present

## 2018-09-18 DIAGNOSIS — Z636 Dependent relative needing care at home: Secondary | ICD-10-CM | POA: Diagnosis not present

## 2018-09-18 DIAGNOSIS — F4010 Social phobia, unspecified: Secondary | ICD-10-CM | POA: Diagnosis not present

## 2018-09-18 NOTE — Progress Notes (Signed)
Psychotherapy Progress Note [facility] [name], PhD LP  Patient ID: Adam Meyers     MRN: [id number]     Therapy format: Individual psychotherapy Date: 09/18/2018     Start: 3:20p Stop: 4:10 Time Spent: 50 min  I connected with patient by telephone, with his informed consent, and verified patient privacy and that I am speaking with the correct person using two identifiers.  I was located at the office and patient in his car.  Session narrative -- presenting needs, interim history, self-report of stressors and symptoms, applications of prior therapy, status changes, and interventions made in session  Heat went out in the house last night, trying to get workman out.  Concerned for mother getting chilled as indoor temps drifted into mid-60s, asked sister if she'd take to her own house, sister only told him (obviously) he should call Surveyor, mining.  C/o her habit of crabbing   Feeling bad about 10-11 days, but without fever (unable to measure, mostly) or cough.  Feels "like somebody pulled the plug".  Had about 4 days of uneasy stomach, then got achy, dizzy, diarrhea for a day.  Splitting headaches, which were relieved with antianxiety work and good physical therapy, seems to be more re   "Petrified" of catching something like COVID and giving it to his mother.  Keeping his hands off things, trying to minimize risk to mother, but sister crabbing about him "being lazy".  In respect of risk, PT has [name] doing some things for mother, still requires sister [name] to do some things  Offered all-purpose response to crabbiness from sister -- "What do you want to have happen right now?" -- to simultaneously confront process and frame a constructive approach, or at least make sister be more honest with herself momentarily about what she is doing with random criticisms.  Likes the approach, will use.  Therapeutic modalities: Cognitive Behavioral Therapy, Assertiveness/Communication,  Ego-Supportive and Psycho-education/Bibliotherapy  Mental Status/Observations:  Appearance:   Not assessed     Behavior:  Not assessed  Motor:  Not assessed  Speech/Language:   Clear and Coherent  Affect:  Not assessed  Mood:  anxious and irritable  Thought process:  normal and cautious  Thought content:    Rumination and worries  Sensory/Perceptual disturbances:    WNL  Orientation:  within normal limits  Attention:  Good  Concentration:  Good  Memory:  WNL  Insight:    Good  Judgment:   Good  Impulse Control:  Good   Risk Assessment: Danger to Self:  No Self-injurious Behavior: No Danger to Others: No Duty to Warn:no Physical Aggression / Violence:No  Access to Firearms a concern: No   Diagnosis:   ICD-10-CM   1. Mixed obsessional thoughts and acts F42.2   2. Caregiver stress Z63.6   3. Chronic fatigue syndrome R53.82   4. Social anxiety disorder F40.10   5. Relationship problem with family member Z63.8    sister    Assessment of progress:  stable  Plan:  . Recommendations/advice as noted above . Particularly use comeback question for crabby behavior by sister . Continue to utilize previously learned skills ad lib Remains disabled from any occupation due to symptom severity. . Maintain medication as prescribed and work faithfully with relevant prescriber(s) if any changes are desired or seem indicated . Call the clinic on-call service, present to ER, or call 911 if any life-threatening emergency Return in about 3 weeks (around 10/09/2018) for as scheduled already, set  up as teletherapy session.   [name], PhD [place] Licensed Psychologist

## 2018-10-09 ENCOUNTER — Ambulatory Visit (INDEPENDENT_AMBULATORY_CARE_PROVIDER_SITE_OTHER): Payer: Medicare HMO | Admitting: Psychiatry

## 2018-10-09 ENCOUNTER — Other Ambulatory Visit: Payer: Self-pay

## 2018-10-09 DIAGNOSIS — R5382 Chronic fatigue, unspecified: Secondary | ICD-10-CM | POA: Diagnosis not present

## 2018-10-09 DIAGNOSIS — Z636 Dependent relative needing care at home: Secondary | ICD-10-CM | POA: Diagnosis not present

## 2018-10-09 DIAGNOSIS — F331 Major depressive disorder, recurrent, moderate: Secondary | ICD-10-CM

## 2018-10-09 DIAGNOSIS — F4010 Social phobia, unspecified: Secondary | ICD-10-CM | POA: Diagnosis not present

## 2018-10-09 DIAGNOSIS — Z638 Other specified problems related to primary support group: Secondary | ICD-10-CM

## 2018-10-09 DIAGNOSIS — G9332 Myalgic encephalomyelitis/chronic fatigue syndrome: Secondary | ICD-10-CM

## 2018-10-09 DIAGNOSIS — F422 Mixed obsessional thoughts and acts: Secondary | ICD-10-CM | POA: Diagnosis not present

## 2018-10-09 NOTE — Progress Notes (Signed)
Psychotherapy Progress Note [facility] [name], PhD LP  Patient ID: Adam Meyers     MRN: [id number]     Therapy format: Individual psychotherapy Date: 10/09/2018     Start: 3:18p Stop: 4:08p Time Spent: 50 min  I connected with patient by Adam Meyers audio, with his informed consent, and verified patient privacy and identity.  I was located at home and patient in his car again, for privacy (computer cannot be shielded from mother's prying eyes and ears).  Session narrative -- presenting needs, interim history, self-report of stressors and symptoms, applications of prior therapy, status changes, and interventions made in session Initial difficulty connecting, as he did not trust unknown number.  Adam Meyers is with him for the foreseeable future, as Adam Meyers is in Adam Meyers's hot spot for COVID, she has no Internet (due to late payment of bill).  Mother has basal cell carcinoma surgery Feb 5, more involved than expected.  Wound nearly closed.  Typical scenario of mother trying to talk more than he can tolerate and need to restrain or redirect.  Still hopes to move out once pandemic lifts and situations stabilize.  Sister Adam Meyers is coming every other day to give mother showers, and not complaining as much about the responsibility.  Says she came in irritable one evening, fired off "I don't have time to listen to you right now," and he said "Ditto".  Sister tends to bring family members with her.  Also surprised that sister spontaneously offered a couple nights of mother at their house.  Overall, less sense of her being ready to battle, no need to use the "customer-service" maneuver.  She is coming over this evening, brother-in-law reportedly recovering from respiratory infection.  PT has been keeping distance within the house, and says he is getting sufficient time out of the house.    His own respiratory illness hung around for about 2 weeks, let up gradually.  Has wondered if it was  COVID-19, b/c he has heard other accounts of no temperature, searing headache, fatigue, just like himself.  Overall anxiety has calmed down.  Better assurance he is not contagious, and, if he did have COVID-19 w/o knowing, successfully prevented contagion in the house.  Appetite coming back, after a period of just grits and chicken soup (as he had when he had the GI virus a year ago).  Back on Gatorade, milk, working in other foods as tolerated, and seemingly not so slowly as he did after the GI virus, which certainly seemed to condition panic and obsession foods then.  Re. OCD now, will have sweating palms going to stores.  Adopting masks and seeing others take precautions has lowered anxiety in public, though, after getting over the initial "creepy" of seeing people in masks.  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  Therapeutic modalities: Cognitive Behavioral Therapy, Assertiveness/Communication and Solution-Oriented/Positive Psychology  Mental Status/Observations:  Appearance:   Not assessed     Behavior:  Not assessed  Motor:  Not assessed  Speech/Language:   Clear and Coherent  Affect:  Not assessed  Mood:  constricted  Thought process:  normal  Thought content:    WNL and except obsessions  Sensory/Perceptual disturbances:    WNL  Orientation:  within normal limits  Attention:  Good  Concentration:  Good  Memory:  WNL  Insight:    Good  Judgment:   Good  Impulse Control:  Good   Risk Assessment: Danger to  Self:  No Self-injurious Behavior: No Danger to Others: No Duty to Warn:no Physical Aggression / Violence:No  Access to Firearms a concern: No   Diagnosis:   ICD-10-CM   1. Mixed obsessional thoughts and acts F42.2   2. Caregiver stress Z63.6   3. Chronic fatigue syndrome R53.82   4. Social anxiety disorder F40.10   5. Relationship problem with family member Z63.8   6. Major depressive disorder, recurrent episode, moderate  (HCC) F33.1    Assessment of progress:  improving  Plan:  . Keep handy the "customer-service" response for sister . Continue managing boundaries with family  . Continue reasonable precautions about pandemic . Continue re-engaging variety of foods to ward off relapse in food aversions . Continue ad lib exposure to contagion doubt, where well-enough warranted  . Other recommendations/advice as noted above . Continue to utilize previously learned skills ad lib . Maintain medication as prescribed and work faithfully with relevant prescriber(s) if any changes are desired or seem indicated . Call the clinic on-call service, present to ER, or call 911 if any life-threatening psychiatric crisis Return in about 3 weeks (around 10/30/2018) for will call; may be teletherapy, as scheduled already.   Adam Friesobert Jimesha Rising, PhD [place] Licensed Psychologist

## 2018-10-17 ENCOUNTER — Other Ambulatory Visit: Payer: Self-pay | Admitting: Psychiatry

## 2018-10-31 ENCOUNTER — Other Ambulatory Visit: Payer: Self-pay

## 2018-10-31 ENCOUNTER — Ambulatory Visit (INDEPENDENT_AMBULATORY_CARE_PROVIDER_SITE_OTHER): Payer: Medicare HMO | Admitting: Psychiatry

## 2018-10-31 DIAGNOSIS — F4010 Social phobia, unspecified: Secondary | ICD-10-CM

## 2018-10-31 DIAGNOSIS — Z636 Dependent relative needing care at home: Secondary | ICD-10-CM

## 2018-10-31 DIAGNOSIS — G9332 Myalgic encephalomyelitis/chronic fatigue syndrome: Secondary | ICD-10-CM

## 2018-10-31 DIAGNOSIS — Z630 Problems in relationship with spouse or partner: Secondary | ICD-10-CM

## 2018-10-31 DIAGNOSIS — F422 Mixed obsessional thoughts and acts: Secondary | ICD-10-CM

## 2018-10-31 DIAGNOSIS — R5382 Chronic fatigue, unspecified: Secondary | ICD-10-CM

## 2018-10-31 NOTE — Progress Notes (Signed)
Psychotherapy Progress Note [facility] [name], PhD LP  Patient ID: Adam Meyers     MRN: [id number]     Therapy format: Individual psychotherapy Date: 10/31/2018     Start: 4:22p Stop: 5:08p Time Spent: 46 min  Telehealth visit (audio) I connected with patient by a video enabled telemedicine/telehealth application or telephone, with his informed consent, and verified patient privacy and that I am speaking with the correct person using two identifiers.  I was located at my home and patient at his home.  We discussed the limitations, risks, and security and privacy concerns associated with telehealth services and the availability of in-person appointments, including awareness that he may be responsible for charges related to the service, and he expressed understanding and agreed to proceed.  I discussed treatment planning with him, with opportunity to ask and answer all questions. Agreed with the plan, demonstrated an understanding of the instructions, and made him aware to call our office if symptoms worsen or he feels he is in a crisis state and needs immediate contact.  Session narrative (presenting needs, interim history, self-report of stressors and symptoms, applications of prior therapy, status changes, and interventions made in session) Chafing about not being able to get haircut yet, feel unsettling to defer grooming.  Worried he will get sucked into having to listen and feel too much with people in his life.  For one, Mom wants to visit the cemetery, father's grave, also unsettling for him.  "May is a strange month, kind of like Christmas gets."  [birth year] birthday (good), 5/11 wedding Adam Meyers (mixed), 2022-11-26 1st anniv of father's death, [birth year] is Adam Meyers's birthday, then Adam Meyers's birthday June [birth year].  Mother now keeping a daily planner for orientation, at speech therapist's suggestion, which is helping.  PT highly apprehensive about mother's grieving, fears engulfment if she gets torn  up and needy.  Acknowledged emotion phobia as underlying part of his  Reframed being there with, and for, his mother is actually a gift he can give, on his own or in his father's honor.  Adam Meyers sent a happy birthday text, he figures to do same.  Says she has been very evasive, reportedly, about specifying her wishes in sharing costs for Adam Meyers's birthday gift.  Discussed ways of moving her to commit, while staying clear and fair himself.  Therapeutic modalities: Cognitive Behavioral Therapy, Assertiveness/Communication and Ego-Supportive  Mental Status/Observations:  Appearance:   Not assessed     Behavior:  Not assessed  Motor:  Not assessed  Speech/Language:   Clear and Coherent  Affect:  Not assessed  Mood:  anxious and responsive  Thought process:  normal  Thought content:    preoccupations  Sensory/Perceptual disturbances:    WNL  Orientation:  grossly intact  Attention:  Good  Concentration:  Good  Memory:  grossly intact  Insight:    Fair  Judgment:   Good  Impulse Control:  Good   Risk Assessment: Danger to Self:  No Self-injurious Behavior: No Danger to Others: No Duty to Warn:no Physical Aggression / Violence:No  Access to Firearms a concern: No   Diagnosis:   ICD-10-CM   1. Mixed obsessional thoughts and acts F42.2   2. Caregiver stress Z63.6   3. Chronic fatigue syndrome R53.82   4. Social anxiety disorder F40.10    Assessment of progress:  stable  Plan:  . Challenge emotional phobia and fulfill values by (1) going with mother to [place] and (2) trying to be more inquisitive with  ex-wife about plans and commitments for son's birthday and less caught up in trying to read what she might mean . Self-affirm he is weathering changes, both in family and in lifestyle . Other recommendations/advice as noted above . Continue to utilize previously learned skills ad lib . Maintain medication as prescribed and work faithfully with relevant prescriber(s) if any changes are  desired or seem indicated . Call the clinic on-call service, present to ER, or call 911 if any life-threatening psychiatric crisis Return in about 3 weeks (around 11/21/2018) for as scheduled already, set up as teletherapy session.   Adam Friesobert Francile Woolford, PhD  Licensed Psychologist

## 2018-11-07 ENCOUNTER — Ambulatory Visit (INDEPENDENT_AMBULATORY_CARE_PROVIDER_SITE_OTHER): Payer: Medicare HMO | Admitting: Psychiatry

## 2018-11-07 ENCOUNTER — Encounter: Payer: Self-pay | Admitting: Psychiatry

## 2018-11-07 ENCOUNTER — Other Ambulatory Visit: Payer: Self-pay

## 2018-11-07 DIAGNOSIS — F4010 Social phobia, unspecified: Secondary | ICD-10-CM

## 2018-11-07 DIAGNOSIS — F422 Mixed obsessional thoughts and acts: Secondary | ICD-10-CM | POA: Diagnosis not present

## 2018-11-07 DIAGNOSIS — F3342 Major depressive disorder, recurrent, in full remission: Secondary | ICD-10-CM

## 2018-11-07 NOTE — Progress Notes (Signed)
Adam Meyers [id number] Jul 04, 1960 58 y.o.  Virtual Visit via Telephone Note  I connected with@ on 11/07/18 at  3:00 PM EDT by telephone and verified that I am speaking with the correct person using two identifiers.   I discussed the limitations, risks, security and privacy concerns of performing an evaluation and management service by telephone and the availability of in person appointments. I also discussed with the patient that there may be a patient responsible charge related to this service. The patient expressed understanding and agreed to proceed.   I discussed the assessment and treatment plan with the patient. The patient was provided an opportunity to ask questions and all were answered. The patient agreed with the plan and demonstrated an understanding of the instructions.   The patient was advised to call back or seek an in-person evaluation if the symptoms worsen or if the condition fails to improve as anticipated.  I provided 20 minutes of non-face-to-face time during this encounter.  The patient was located at home.  The provider was located at home.   Lauraine Rinne, MD   Subjective:   Patient ID:  Adam Meyers is a 58 y.o. (DOB [birth year]) male.  Chief Complaint:  Chief Complaint  Patient presents with  . Follow-up    Med management  . Anxiety    OCD  . Sleeping Problem    HPI [name] presents for follow-up of OCD and anxiety.  Last seen August 2019.  He was due back in December.  No meds were changed at the time.  He was taking Luvox 150 mg a day and diazepam 5 mg at night for sleep  Overall Ok.  Shopping stressful with OCD and Covid.  Wears me out. Can go 2 times weekly.  It triggers him. Not triggered more washing or cleaning rituals. Anniv of father's passing and get a bit down over that and living with mother. And confined. Patient reports stable mood and denies depressed or irritable moods.  Patient denies difficulty with sleep initiation or  maintenance. 10-11 hours typical lately.  Brain won't work with less. Strange dreams.  Denies appetite disturbance.  Patient reports that energy and motivation have been good.  Patient denies any difficulty with concentration.  Patient denies any suicidal ideation.  Question about March episode with fatigue and HA.  Wonders if had Covid.  Past Psychiatric Medication Trials: Paxil Sex SE, pindolol, propranolol,   Review of Systems:  Review of Systems  HENT: Positive for postnasal drip, rhinorrhea and sneezing.   Neurological: Negative for tremors and weakness.    Medications: I have reviewed the patient's current medications.  Current Outpatient Medications  Medication Sig Dispense Refill  . calcium-vitamin D (OSCAL WITH D) 500-200 MG-UNIT tablet Take 1 tablet by mouth.    . diazepam (VALIUM) 5 MG tablet TAKE 1 TABLET BY MOUTH THREE TIMES A DAY AS NEEDED FOR ANXIETY 90 tablet 0  . famotidine (PEPCID) 20 MG tablet Take 20 mg by mouth daily as needed for heartburn or indigestion.    . fluvoxaMINE (LUVOX) 100 MG tablet TAKE 1/2 TAB BY MOUTH IN THE MORNING AND 1 AND 1/2 TAB AT BEDTIME (Patient taking differently: Take by mouth. TAKE 1/2 TAB BY MOUTH IN THE MORNING AND 1 TAB AT BEDTIME) 60 tablet 2  . ibuprofen (ADVIL) 200 MG tablet Take 600 mg by mouth every 8 (eight) hours as needed for moderate pain.      No current facility-administered medications for this  visit.     Medication Side Effects: None  Allergies:  Allergies  Allergen Reactions  . Oxycodone-Acetaminophen Itching  . Seasonal Ic [Cholestatin]     Drainage,itchy eyes  . Gabapentin Anxiety  . Percocet [Oxycodone-Acetaminophen] Itching and Anxiety    Past Medical History:  Diagnosis Date  . Compressed cervical disc   . Cough   . Depression   . Hyperlipidemia   . Nasal congestion   . Social anxiety disorder 04/10/2018    Family History  Problem Relation Age of Onset  . Cancer Maternal Grandmother        melanoma     Social History   Socioeconomic History  . Marital status: Married    Spouse name: Not on file  . Number of children: Not on file  . Years of education: Not on file  . Highest education level: Not on file  Occupational History  . Not on file  Social Needs  . Financial resource strain: Not on file  . Food insecurity:    Worry: Not on file    Inability: Not on file  . Transportation needs:    Medical: Not on file    Non-medical: Not on file  Tobacco Use  . Smoking status: Never Smoker  . Smokeless tobacco: Never Used  Substance and Sexual Activity  . Alcohol use: No  . Drug use: No  . Sexual activity: Not on file  Lifestyle  . Physical activity:    Days per week: Not on file    Minutes per session: Not on file  . Stress: Not on file  Relationships  . Social connections:    Talks on phone: Not on file    Gets together: Not on file    Attends religious service: Not on file    Active member of club or organization: Not on file    Attends meetings of clubs or organizations: Not on file    Relationship status: Not on file  . Intimate partner violence:    Fear of current or ex partner: Not on file    Emotionally abused: Not on file    Physically abused: Not on file    Forced sexual activity: Not on file  Other Topics Concern  . Not on file  Social History Narrative  . Not on file    Past Medical History, Surgical history, Social history, and Family history were reviewed and updated as appropriate.   Please see review of systems for further details on the patient's review from today.   Objective:   Physical Exam:  There were no vitals taken for this visit.  Physical Exam Neurological:     Mental Status: He is alert and oriented to person, place, and time.     Cranial Nerves: No dysarthria.  Psychiatric:        Attention and Perception: Attention normal. He does not perceive auditory hallucinations.        Mood and Affect: Mood is anxious. Mood is not  depressed.        Speech: Speech normal.        Behavior: Behavior is cooperative.        Thought Content: Thought content normal. Thought content is not paranoid or delusional. Thought content does not include homicidal or suicidal ideation. Thought content does not include homicidal or suicidal plan.        Cognition and Memory: Cognition and memory normal.        Judgment: Judgment normal.  Comments: Residual chronic OCD but he feels it's manageable.     Lab Review:  No results found for: NA, K, CL, CO2, GLUCOSE, BUN, CREATININE, CALCIUM, PROT, ALBUMIN, AST, ALT, ALKPHOS, BILITOT, GFRNONAA, GFRAA  No results found for: WBC, RBC, HGB, HCT, PLT, MCV, MCH, MCHC, RDW, LYMPHSABS, MONOABS, EOSABS, BASOSABS  No results found for: POCLITH, LITHIUM   No results found for: PHENYTOIN, PHENOBARB, VALPROATE, CBMZ   .res Assessment: Plan:    Mixed obsessional thoughts and acts  Social anxiety disorder  Recurrent major depression in full remission (HCC)   Rocky LinkKen has a long history of OCD and a history of depression as well and first came to our practice and April 2014.  He continues in therapy with Dr. Amado CoeAndrew Mitchem.  While he has made a lot of progress and is much less distressed by his OCD.  He remains disabled because of time requirements of the OCD and he is easily triggered into compulsive rituals with high levels of anxiety.  With his limited life demands and relative isolation he manages acceptably well.  He does not want to take take more SSRI medication because of sexual side effects are worse at higher dosages.  Continue meds.  Benefit from meds. Rec Cont it longterm bc high relapse risk.  Disc SE.  FU 6 mos  Meredith Staggersarey Cottle, MD, DFAPA   Please see After Visit Summary for patient specific instructions.  Future Appointments  Date Time Provider Department Center  11/20/2018  3:00 PM Robley FriesMitchum, Robert, PhD [facility] None    No orders of the defined types were placed in this  encounter.     -------------------------------

## 2018-11-20 ENCOUNTER — Encounter: Payer: Medicare HMO | Admitting: Psychiatry

## 2018-11-20 ENCOUNTER — Other Ambulatory Visit: Payer: Self-pay

## 2018-11-20 DIAGNOSIS — F422 Mixed obsessional thoughts and acts: Secondary | ICD-10-CM

## 2018-11-20 NOTE — Progress Notes (Signed)
Psychotherapy Progress Note - aborted attempt at telehealth [facility] [name], PhD LP  Patient ID: KYRIN THEURER     MRN: [id number]     Therapy format: Individual psychotherapy Date: 11/20/2018     Start: 3:11p  Session narrative (presenting needs, interim history, self-report of stressors and symptoms, applications of prior therapy, status changes, and interventions made in session) Initial calls went straight to voicemail, PT did not see.  Direct-texted to establish call, but repeatedly went to voicemail, suggesting poor cell reception.  Reports only able to text, phone was acting up like this a month ago and required vendor service.  Tried other measures 40 minutes, gave up, PT will RS.  [name], PhD [place] Licensed Psychologist

## 2018-12-04 ENCOUNTER — Ambulatory Visit (INDEPENDENT_AMBULATORY_CARE_PROVIDER_SITE_OTHER): Payer: Medicare HMO | Admitting: Psychiatry

## 2018-12-04 ENCOUNTER — Other Ambulatory Visit: Payer: Self-pay

## 2018-12-04 DIAGNOSIS — F422 Mixed obsessional thoughts and acts: Secondary | ICD-10-CM

## 2018-12-04 DIAGNOSIS — F4010 Social phobia, unspecified: Secondary | ICD-10-CM | POA: Diagnosis not present

## 2018-12-04 DIAGNOSIS — F3342 Major depressive disorder, recurrent, in full remission: Secondary | ICD-10-CM

## 2018-12-04 DIAGNOSIS — Z630 Problems in relationship with spouse or partner: Secondary | ICD-10-CM

## 2018-12-04 DIAGNOSIS — Z636 Dependent relative needing care at home: Secondary | ICD-10-CM | POA: Diagnosis not present

## 2018-12-04 DIAGNOSIS — R5382 Chronic fatigue, unspecified: Secondary | ICD-10-CM | POA: Diagnosis not present

## 2018-12-04 DIAGNOSIS — G9332 Myalgic encephalomyelitis/chronic fatigue syndrome: Secondary | ICD-10-CM

## 2018-12-04 NOTE — Progress Notes (Signed)
Psychotherapy Progress Note [facility] Adam CzarAndy Melaine Mcphee, PhD LP  Patient ID: Adam LowJames K Meyers     MRN: [id number]     Therapy format: Individual psychotherapy Date: 12/04/2018     Start: 3:22p Stop: 4:13p Time Spent: 51 min  Telehealth visit (audio only) I connected with patient by a video enabled telemedicine/telehealth application or telephone, with his informed consent, and verified patient privacy and that I am speaking with the correct person using two identifiers.  I was located at my office and patient at his home.  We discussed the limitations, risks, and security and privacy concerns associated with telehealth services and the availability of in-person appointments, including awareness that he may be responsible for charges related to the service, and he expressed understanding and agreed to proceed.  I discussed treatment planning with him, with opportunity to ask and answer all questions. Agreed with the plan, demonstrated an understanding of the instructions, and made him aware to call our office if symptoms worsen or he feels he is in a crisis state and needs immediate contact.  Session narrative (presenting needs, interim history, self-report of stressors and symptoms, applications of prior therapy, status changes, and interventions made in session) 5 weeks since last connected well. Last session unable to connect effectively since PT's phone malfunctioning.  Anniversaries of father's birth and death both came in May.  [facility] and family went better than expected, mother not losing it as he feared ([place]" [place]  Worth it to himself, also, going to the grave 5/18, unexpectedly serene vs the emotional turmoil he expected to feel.  Was also able to meet again 4 days later for gathering over father's birthday.  Credits prayer, conviction to do a good thing, and enough sleep.    Took mother to podiatrist last week,  she finished before he came back, and a "serendipity" moment happened when masked nurse came out to the door, sparked phobic anxiety (didn't know why, but probably the image of contagion).  Went home, rested and let the feeling wash through, as instructed previously.   Since then been to a few stores and drive-throughs.  Affirmed further   Adam Meyers asked for money again, specifically to "float" her $300, to cover being short on rent, which created dilemma to make sure his child has a roof.  Managed to gently confront track record of not paying back, decided to split it into half gift, half loan, and also managed to confront the method of communication, telling her if it's that much please talk directly rather than text (she had been there in person a few hours earlier).  In a number of ways, PT pulled off assertive communication and, effectively, behavior modification for honesty and directness.  On the downside, looked into Adam Meyers's account, saw most of his $1200 stimulus money was gone, explained by Adam Meyers as the money was needed to pay rent -- clear evidence Adam Meyers has made him partly responsible for the costs of her indulgence.  PT has offered to pay for Internet at Adam Meyers's home, reasoning it is a school expense, and the computer would be better protected than using unsecured wifi.  Similarly covering Adam Meyers's telephone, though both problems seem to have stemmed from Adam Meyers being delinquent on the bill.  Affirmed variety of courageous behaviors exposing to perceived risks of contagion and emotional turmoil, staying present, refraining from escape and catastrophizing, and setting boundaries with firmness and grace.  Therapeutic  modalities: Cognitive Behavioral Therapy, Assertiveness/Communication and Solution-Oriented/Positive Psychology  Mental Status/Observations:  Appearance:   Not assessed     Behavior:  Not assessed  Motor:  Not assessed  Speech/Language:   Clear and Coherent  Affect:  Not assessed   Mood:  Somewhat anxious, responsive to praise  Thought process:  normal  Thought content:    some obsessions  Sensory/Perceptual disturbances:    WNL  Orientation:  grossly intact  Attention:  Good  Concentration:  Good  Memory:  WNL  Insight:    Good  Judgment:   Good  Impulse Control:  Good   Risk Assessment: Danger to Self:  No Self-injurious Behavior: No Danger to Others: No Duty to Warn:no Physical Aggression / Violence:No  Access to Firearms a concern: No   Diagnosis:   ICD-10-CM   1. Mixed obsessional thoughts and acts F42.2   2. Social anxiety disorder F40.10   3. Chronic fatigue syndrome R53.82   4. Caregiver stress Z63.6   5. Recurrent major depression in full remission (Adam Meyers) F33.42   6. Relationship problem between partners Z63.0    Assessment of progress:  improving  Plan:  . Continue to identify and expose to irrationally feared events, allow anxiety to wash through without guarding/escaping -- especially emotophobia scenarios  . Continue to practice firm and fair boundary-setting with ex-wife where needed, including appropriate questioning of requests for monetary help and educating son as indicated about what is/is not fair to ask of him and his own money . Other recommendations/advice as noted above . Continue to utilize previously learned skills ad lib . Maintain medication as prescribed and work faithfully with relevant prescriber(s) if any changes are desired or seem indicated . Call the clinic on-call service, present to ER, or call 911 if any life-threatening psychiatric crisis Return in about 3 weeks (around 12/25/2018) for as scheduled already, set up as teletherapy session.   Blanchie Serve, PhD Luan Moore, PhD LP Clinical Psychologist, [facility] [address] [place], [place] [address] [phone]

## 2018-12-11 ENCOUNTER — Ambulatory Visit: Payer: Medicare HMO | Admitting: Psychiatry

## 2018-12-13 ENCOUNTER — Telehealth: Payer: Self-pay | Admitting: Psychiatry

## 2018-12-13 NOTE — Telephone Encounter (Signed)
Called pt back and answered some questions for him.

## 2018-12-13 NOTE — Telephone Encounter (Signed)
Yvone Neu called to report that he has a disability update that he needs to discuss with you.  Just has 2 questions that need to be answered.  Please call.

## 2018-12-25 ENCOUNTER — Other Ambulatory Visit: Payer: Self-pay

## 2018-12-25 ENCOUNTER — Ambulatory Visit: Payer: Medicare HMO | Admitting: Psychiatry

## 2018-12-25 MED ORDER — DIAZEPAM 5 MG PO TABS: ORAL_TABLET | ORAL | 0 refills | Status: DC

## 2019-01-15 ENCOUNTER — Ambulatory Visit: Payer: Medicare HMO | Admitting: Psychiatry

## 2019-01-23 ENCOUNTER — Ambulatory Visit (INDEPENDENT_AMBULATORY_CARE_PROVIDER_SITE_OTHER): Payer: Medicare HMO | Admitting: Psychiatry

## 2019-01-23 ENCOUNTER — Other Ambulatory Visit: Payer: Self-pay

## 2019-01-23 DIAGNOSIS — R5382 Chronic fatigue, unspecified: Secondary | ICD-10-CM

## 2019-01-23 DIAGNOSIS — G9332 Myalgic encephalomyelitis/chronic fatigue syndrome: Secondary | ICD-10-CM

## 2019-01-23 DIAGNOSIS — Z636 Dependent relative needing care at home: Secondary | ICD-10-CM

## 2019-01-23 DIAGNOSIS — F422 Mixed obsessional thoughts and acts: Secondary | ICD-10-CM

## 2019-01-23 NOTE — Progress Notes (Signed)
Psychotherapy Progress Note [facility] Luan Moore, PhD LP  Patient ID: Adam Meyers     MRN: [id number]     Therapy format: Individual psychotherapy Date: 01/23/2019     Start: 4:19p Stop: 5:20p Time Spent: 45 min (remainder donated) Location: telehealth   Telehealth visit -- I connected with this patient by an approved telecommunication method (audio only), with his informed consent, and verifying identity and patient privacy.  I was located at my office and patient at his home.  As needed, we discussed the limitations, risks, and security and privacy concerns associated with telehealth service, including the availability and conditions which currently govern in-person appointments and the possibility that 3rd-party payment may not be fully guaranteed and he may be responsible for charges.  After he indicated understanding, we proceeded with the session.  Also discussed treatment planning, as needed, including ongoing verbal agreement with the plan, the opportunity to ask and answer all questions, his demonstrated understanding of instructions, and his readiness to call the office should symptoms worsen or he feels he is in a crisis state and needs more immediate and tangible assistance.  Session narrative (presenting needs, interim history, self-report of stressors and symptoms, applications of prior therapy, status changes, and interventions made in session) Mother in hospital most of the month, getting pacemaker replacement, bout of diarrhea, then admitted for UTI, released, then reported positive results for strep in her blood.  PT had an OCD challenge taking [place] [address].  High-powered antibiotics, tested for infection of the heart and pacemaker insertion, found two large clots in left ventricle, treated with blood thinner 5 days, returned home.  Complications dosing Eliquis and 21-day antibiotic, with home health nursing training in how to administer via  PIC line.  PT able to do the procedure, did not involve blood exposure but did aggravate back.  Mother home 3-4 days, late one night heard her nearly fall and talk gibberish -- suspected TIA, as she recovered a lot by the time EMTs arrived, but taken to hospital for evaluation.  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  A fib known to be a prior problem but was only on 81mg  ASA 3/wk (possibly for hx of excessive bleeding).  Mother weakened right now, will have to go to a rehab unit before going home.  Mom believes she is headed off to die, but hopefully Education officer, museum and the kids can get her to understand better than that.  She has hx 5 yrs ago of going to rehab before home (pneumonia), but the current pandemic and news stories have her spooked about catching COVID and dying, plus she has survived husband's death since then.  Probed family readiness to empathize and coax a more optimistic outlook, resources like pastor or chaplain, etc.    PT himself has looked into news on rehab homes, knows [facility] has been COVID-free, [place] ravaged.  Probed PT's concerns and expectations for receiving mother home -- mainly concerned she will develop rampant incontinence, because he is viscerally opposed to encountering his mother's private parts.  Thought experiment to stretch readiness to do personal hygiene tasks in case of crisis need, including possibilities of mother wiping herself, PT finding a focus point.  In reality, more likely to have some home health service immediately following rehab stay, but it will also be the next likely conflict with sister over whether he should just suck it up and  be mother's CNA for private hygiene.  Has not received inquiry form Social Security to review disability.  Has replied to questionnaire, so probably pandemic-related slowdown processing.  Therapeutic modalities: Cognitive Behavioral Therapy, Solution-Oriented/Positive  Psychology and Ego-Supportive  Mental Status/Observations:  Appearance:   Not assessed     Behavior:  Appropriate  Motor:  Not assessed  Speech/Language:   Clear and Coherent  Affect:  Not assessed  Mood:  anxious  Thought process:  normal  Thought content:    Obsessions  Sensory/Perceptual disturbances:    WNL  Orientation:  grossly intact  Attention:  Good  Concentration:  Good  Memory:  WNL  Insight:    Good  Judgment:   Good  Impulse Control:  Fair   Risk Assessment: Danger to Self: No Self-injurious Behavior: No Danger to Others: No Physical Aggression / Violence: No Duty to Warn: No Access to Firearms a concern: No  Assessment of progress:  stabilized  Diagnosis:   ICD-10-CM   1. Mixed obsessional thoughts and acts  F42.2   2. Chronic fatigue syndrome  R53.82   3. Caregiver stress  Z63.6    Plan:  . Work through [employer] tasks managing mothers transfer to rehab and be ready to empathize and reality-check her catastrophizing/fatalism about going off to die. . Ad lib exposure to OCD triggers . Other recommendations/advice as noted above . Continue to utilize previously learned skills ad lib . Maintain medication as prescribed and work faithfully with relevant prescriber(s) if any changes are desired or seem indicated . Call the clinic on-call service, present to ER, or call 911 if any life-threatening psychiatric crisis Return in about 3 weeks (around 02/13/2019).  Robley Friesobert Ioma Chismar, PhD Marliss CzarAndy Sade Mehlhoff, PhD LP Clinical Psychologist, [facility] [address], [address] [place], [place] [address] [phone]

## 2019-02-12 ENCOUNTER — Ambulatory Visit (INDEPENDENT_AMBULATORY_CARE_PROVIDER_SITE_OTHER): Payer: Medicare HMO | Admitting: Psychiatry

## 2019-02-12 ENCOUNTER — Other Ambulatory Visit: Payer: Self-pay

## 2019-02-12 DIAGNOSIS — F4010 Social phobia, unspecified: Secondary | ICD-10-CM | POA: Diagnosis not present

## 2019-02-12 DIAGNOSIS — R5382 Chronic fatigue, unspecified: Secondary | ICD-10-CM

## 2019-02-12 DIAGNOSIS — F422 Mixed obsessional thoughts and acts: Secondary | ICD-10-CM | POA: Diagnosis not present

## 2019-02-12 DIAGNOSIS — Z636 Dependent relative needing care at home: Secondary | ICD-10-CM | POA: Diagnosis not present

## 2019-02-12 DIAGNOSIS — G9332 Myalgic encephalomyelitis/chronic fatigue syndrome: Secondary | ICD-10-CM

## 2019-02-12 NOTE — Progress Notes (Signed)
Psychotherapy Progress Note [facility] Luan Moore, PhD LP  Patient ID: Adam Meyers     MRN: [id number]     Therapy format: Individual psychotherapy Date: 02/12/2019     Start: 3:18p Stop: 4:09p Time Spent: 51 min Location: telehealth   Telehealth visit -- I connected with this patient by an approved telecommunication method (audio only), with his informed consent, and verifying identity and patient privacy.  I was located at my office and patient at his home.  As needed, we discussed the limitations, risks, and security and privacy concerns associated with telehealth service, including the availability and conditions which currently govern in-person appointments and the possibility that 3rd-party payment may not be fully guaranteed and he may be responsible for charges.  After he indicated understanding, we proceeded with the session.  Also discussed treatment planning, as needed, including ongoing verbal agreement with the plan, the opportunity to ask and answer all questions, his demonstrated understanding of instructions, and his readiness to call the office should symptoms worsen or he feels he is in a crisis state and needs more immediate and tangible assistance.  Session narrative (presenting needs, interim history, self-report of stressors and symptoms, applications of prior therapy, status changes, and interventions made in session) More stressed lately -- uncle [place], [place], diabetic, Actor, died of COVID, while in a [place] respite center.  Ethlyn Daniels had CVA, got pneumonia in rehab center, recovered.  Another aunt with a series of mini-strokes.  Barbaraann Rondo was an Visual merchandiser figure, and son [name] took it hard.  Some comfort in faith, heaven, and the idea that PT's father and Gardenia Phlegm were as brothers.  The funeral, errands leading up to it, one aunt wanting to hug on him.  Withstood the assault of anxiety triggers.  As mentioned last time, mother had UTI and at least  the appearance of a stroke (TIA, vivid experience of speaking gibberish).  Re. OCD/phobia had the massive exposure exercise of taking mother to Infectious Disease appointment in the course of things.  Experience amped up by going in a men's room and seeing a condom dispenser with a public service announcement about whether you know your HIV+ status.  Set PT off with memories of uncontrolled phobia/OCD and a 2-day flareup of fatigue.  Affirmed coping with it.  Mother has been at [facility] for rehab lately, with report of lower extremity edema (related to CHF, but also showing pneumonia in left lung).  PT & sister have not told mother about Gardenia Phlegm, to spare risk of emotional distress.  Quandary about the right thing to do.  Affirmed it's OK to wait -- it's done in love.  Anticipating stress and fatigue when mother comes home, and health care workers coming in and out.  Some fear of infection, and of getting fatigued, and of [name] becoming a passer of the virus.  Finds himself startled by the phone ringing, attrib to generalizing anxiety and precursor experiences to mother's eventual death.  Does find that he has less headache with mother out of the house.  Can still have musculoskeletal headache if straining something, but less of tension.  Does find he has a "2nd-shift body clock" in a first-shift world.  Encouraged in backing up circadian rhythm for resiliency against mood and anxiety.  Acknowledged greater need for rest with fibromyalgia/chronic fatigue element.   Therapeutic modalities: Cognitive Behavioral Therapy and Solution-Oriented/Positive Psychology  Mental Status/Observations:  Appearance:   Not assessed     Behavior:  Appropriate  Motor:  Not assessed  Speech/Language:   Clear and Coherent  Affect:  Not assessed  Mood:  anxious and dysthymic  Thought process:  normal  Thought content:    within normal limits  Sensory/Perceptual disturbances:    WNL  Orientation:  grossly intact   Attention:  Good  Concentration:  Good  Memory:  WNL  Insight:    Good  Judgment:   Good  Impulse Control:  Good   Risk Assessment: Danger to Self: No Self-injurious Behavior: No Danger to Others: No Physical Aggression / Violence: No Duty to Warn: No Access to Firearms a concern: No  Assessment of progress:  progressing  Diagnosis:   ICD-10-CM   1. Mixed obsessional thoughts and acts  F42.2   2. Chronic fatigue syndrome  R53.82   3. Caregiver stress  Z63.6   4. Social anxiety disorder  F40.10    Plan:  . Continue to step up to as many OCD triggers as possible in caregiving, within safe pandemic practices . Other recommendations/advice as noted above . Continue to utilize previously learned skills ad lib . Maintain medication as prescribed and work faithfully with relevant prescriber(s) if any changes are desired or seem indicated . Call the clinic on-call service, present to ER, or call 911 if any life-threatening psychiatric crisis Return in about 3 weeks (around 03/05/2019) for session(s) already scheduled.  Robley Friesobert Jemiah Cuadra, PhD Marliss CzarAndy Kelcy Laible, PhD LP Clinical Psychologist, [facility] [address], [address] [place], [place] [address] [phone](o) [phone]

## 2019-03-06 ENCOUNTER — Ambulatory Visit (INDEPENDENT_AMBULATORY_CARE_PROVIDER_SITE_OTHER): Payer: Medicare HMO | Admitting: Psychiatry

## 2019-03-06 ENCOUNTER — Other Ambulatory Visit: Payer: Self-pay

## 2019-03-06 DIAGNOSIS — Z638 Other specified problems related to primary support group: Secondary | ICD-10-CM

## 2019-03-06 DIAGNOSIS — Z636 Dependent relative needing care at home: Secondary | ICD-10-CM | POA: Diagnosis not present

## 2019-03-06 DIAGNOSIS — F422 Mixed obsessional thoughts and acts: Secondary | ICD-10-CM | POA: Diagnosis not present

## 2019-03-06 DIAGNOSIS — F4010 Social phobia, unspecified: Secondary | ICD-10-CM | POA: Diagnosis not present

## 2019-03-06 DIAGNOSIS — R5382 Chronic fatigue, unspecified: Secondary | ICD-10-CM | POA: Diagnosis not present

## 2019-03-06 DIAGNOSIS — F331 Major depressive disorder, recurrent, moderate: Secondary | ICD-10-CM

## 2019-03-06 DIAGNOSIS — G9332 Myalgic encephalomyelitis/chronic fatigue syndrome: Secondary | ICD-10-CM

## 2019-03-06 NOTE — Progress Notes (Signed)
Psychotherapy Progress Note [facility] Luan Moore, PhD LP  Patient ID: Adam Meyers     MRN: [id number]     Therapy format: Individual psychotherapy Date: 03/06/2019     Start: 4:24p Stop: 5:14p Time Spent: 50 min Location: telehealth   Telehealth visit -- I connected with this patient by an approved telecommunication method (audio only), with his informed consent, and verifying identity and patient privacy.  I was located at my office and patient at his home.  As needed, we discussed the limitations, risks, and security and privacy concerns associated with telehealth service, including the availability and conditions which currently govern in-person appointments and the possibility that 3rd-party payment may not be fully guaranteed and he may be responsible for charges.  After he indicated understanding, we proceeded with the session.  Also discussed treatment planning, as needed, including ongoing verbal agreement with the plan, the opportunity to ask and answer all questions, his demonstrated understanding of instructions, and his readiness to call the office should symptoms worsen or he feels he is in a crisis state and needs more immediate and tangible assistance.  Session narrative (presenting needs, interim history, self-report of stressors and symptoms, applications of prior therapy, status changes, and interventions made in session) Headache for a week recently.  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  Knows he has been very preoccupied last few weeks about mother coming home from nursing home rehab stint.  Stressful last Friday being up and going out for [place] for [employer]  Main stress is that mother is coming home tomorrow.  She got partial pneumonia while at rehab center for mini-stroke.  Dreads the risk and  pressure of responsibility, also the parade of outside people coming into the home, and how they will probably want to come in early, before his customary wake time.  By [place], things were such that she could fix her own breakfast most mornings, a blessing cancelled by her [place] [employer] incident.  Good news that mother is managing 300 feet on walker at nursing home and is remastering a few stairs.  Almost adamant that he will not be getting up early to get her breakfast.  Has headache upon facing intense emotional stress, like the prospect of detailed responsibilities, the prospect of bending his sleep-wake time, and the prospect of sister getting on his case to do things for mother which are taxing or distasteful.  Supportively confronted agonizing about it, encouraged to go ahead and work out what can be done by others and what cannot, and to cope with the prospect of people coming into the house.  Re. Mother's breakfast vs, his predilection for staying in bed till midday, challenged PT be willing to get up earlier, go to bed earlier to accommodate normal need for mother to get fed earlier.  Compared to the dedication and sacrifice he would make for his son, and did for 3 years after separation.  [facility] [employer] accepted to [employer] for next year on transfer from community college in [place].    Therapeutic modalities: Cognitive Behavioral Therapy  Mental Status/Observations:  Appearance:   Not assessed     Behavior:  Resistant  Motor:  Not assessed  Speech/Language:   Clear and Coherent  Affect:  Not assessed  Mood:  anxious and irritable  Thought process:  normal  Thought content:  Obsessions and preoccupation with unwanted responsibilities  Sensory/Perceptual disturbances:    WNL  Orientation:  grossly intact  Attention:  Good  Concentration:  Good  Memory:  WNL  Insight:    Fair  Judgment:   Fair  Impulse Control:  Good   Risk Assessment: Danger to Self: No Self-injurious Behavior:  No Danger to Others: No Physical Aggression / Violence: No Duty to Warn: No Access to Firearms a concern: No  Assessment of progress:  situational setback(s)  Diagnosis:   ICD-10-CM   1. Mixed obsessional thoughts and acts  F42.2   2. Social anxiety disorder  F40.10   3. Major depressive disorder, recurrent episode, moderate (HCC)  F33.1   4. Caregiver stress  Z63.6   5. Chronic fatigue syndrome  R53.82   6. Relationship problem with family member  Z63.8     Plan:  . Advised to migrate bedtime and wake time to accommodate mother's care needs and/or hire help to do so and/or negotiate with sister . Return to tactics for improving psychosomatic headache and other aspects of depression and CF/FM condition . Other recommendations/advice as noted above . Continue to utilize previously learned skills ad lib . Maintain medication as prescribed and work faithfully with relevant prescriber(s) if any changes are desired or seem indicated . Call the clinic on-call service, present to ER, or call 911 if any life-threatening psychiatric crisis Return in about 3 weeks (around 03/27/2019).  Robley Friesobert Charde Macfarlane, PhD Marliss CzarAndy Pacey Willadsen, PhD LP Clinical Psychologist, [facility] [address], [address] [place], [place] [address] [phone](o) [phone]

## 2019-03-14 ENCOUNTER — Other Ambulatory Visit: Payer: Self-pay | Admitting: Psychiatry

## 2019-03-27 ENCOUNTER — Other Ambulatory Visit: Payer: Self-pay

## 2019-03-27 ENCOUNTER — Ambulatory Visit (INDEPENDENT_AMBULATORY_CARE_PROVIDER_SITE_OTHER): Payer: Medicare HMO | Admitting: Psychiatry

## 2019-03-27 DIAGNOSIS — G9332 Myalgic encephalomyelitis/chronic fatigue syndrome: Secondary | ICD-10-CM

## 2019-03-27 DIAGNOSIS — F422 Mixed obsessional thoughts and acts: Secondary | ICD-10-CM

## 2019-03-27 DIAGNOSIS — R5382 Chronic fatigue, unspecified: Secondary | ICD-10-CM | POA: Diagnosis not present

## 2019-03-27 DIAGNOSIS — F4010 Social phobia, unspecified: Secondary | ICD-10-CM

## 2019-03-27 DIAGNOSIS — G988 Other disorders of nervous system: Secondary | ICD-10-CM

## 2019-03-27 DIAGNOSIS — Z636 Dependent relative needing care at home: Secondary | ICD-10-CM

## 2019-03-27 NOTE — Progress Notes (Addendum)
Psychotherapy Progress Note [facility] Luan Moore, PhD LP  Patient ID: Adam Meyers     MRN: [id number]     Therapy format: Individual psychotherapy Date: 03/27/2019     Start: 4:23p Stop: 5:13p Time Spent: 50 min Location: telehealth   Telehealth visit -- I connected with this patient by an approved telecommunication method (audio only), with his informed consent, and verifying identity and patient privacy.  I was located at my home and patient at his car.  As needed, we discussed the limitations, risks, and security and privacy concerns associated with telehealth service, including the availability and conditions which currently govern in-person appointments and the possibility that 3rd-party payment may not be fully guaranteed and he may be responsible for charges.  After he indicated understanding, we proceeded with the session.  Also discussed treatment planning, as needed, including ongoing verbal agreement with the plan, the opportunity to ask and answer all questions, his demonstrated understanding of instructions, and his readiness to call the office should symptoms worsen or he feels he is in a crisis state and needs more immediate and tangible assistance.  Session narrative (presenting needs, interim history, self-report of stressors and symptoms, applications of prior therapy, status changes, and interventions made in session) Intimidating prospect of care for mother helped by arranging a home health worker 6-9pm QD who handles things like changing her diaper.  Glad not to have to do intimate personal care, trying to cope with the parade of therapists and home care coming into the house.  Sister coming over more.  Suffering headaches and tension.  PT still has stress of monitoring mother's safety, maybe in another week can be free to arrange some personal time out of the house  Discussed resuming physical therapy as a help to his body and stress release, resolved to  call former therapist for referral.  Miscellaneous stresses of home repairs (dishwasher, heat last night).  Has taken up food duties as needed, including breakfast for mother.  Has noticed, ever since his breakdown, how his hearing has been hypersensitive.  Annoyed by noises that used to not bother.  Has felt more affected lately by noticing his losses -- wife, health, home -- compared to before breakdown and breakup several years ago.  Re. anxiety tolerance, had a "serendipity moment" yesterday having to go to an obviously less sanitary store, without PPE, in the company of lax maskers, getting things he could not find at the usual, more trusted store.  Supported/encouraged in further unplanned exposures to anxiety, differentiating from proper caution with pandemic.  Therapeutic modalities: Cognitive Behavioral Therapy  Mental Status/Observations:  Appearance:   Not assessed     Behavior:  Appropriate  Motor:  Not assessed  Speech/Language:   Clear and Coherent  Affect:  Not assessed  Mood:  anxious  Thought process:  normal  Thought content:    anxious sensitivities  Sensory/Perceptual disturbances:    generally WNL, noted auditory hypersensitivity  Orientation:  within normal limits  Attention:  Good  Concentration:  Good  Memory:  WNL  Insight:    Good  Judgment:   Good  Impulse Control:  Good   Risk Assessment: Danger to Self: No Self-injurious Behavior: No Danger to Others: No Physical Aggression / Violence: No Duty to Warn: No Access to Firearms a concern: No  Assessment of progress:  progressing  Diagnosis:   ICD-10-CM   1. Mixed obsessional thoughts and acts  F42.2   2. Social anxiety disorder  F40.10   3. Caregiver stress  Z63.6   4. 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 Keep working with tolerating aversion to people in the home . Stay faithful to earlier schedule making mother's breakfast . Continue to face  necessary anxieties out of the house ad lib . Look into resuming physical therapy and/or therapeutic massage . Other recommendations/advice as noted above . Continue to utilize previously learned skills ad lib . Maintain medication as prescribed and work faithfully with relevant prescriber(s) if any changes are desired or seem indicated . Call the clinic on-call service, present to ER, or call 911 if any life-threatening psychiatric crisis No follow-ups on file.  [name], PhD [name], PhD LP Clinical Psychologist, [facility] [address] [place], [place] [address] [phone]

## 2019-04-10 DIAGNOSIS — M542 Cervicalgia: Secondary | ICD-10-CM | POA: Diagnosis not present

## 2019-04-10 DIAGNOSIS — Z23 Encounter for immunization: Secondary | ICD-10-CM | POA: Diagnosis not present

## 2019-04-10 DIAGNOSIS — Z79899 Other long term (current) drug therapy: Secondary | ICD-10-CM | POA: Diagnosis not present

## 2019-04-10 DIAGNOSIS — Z1211 Encounter for screening for malignant neoplasm of colon: Secondary | ICD-10-CM | POA: Diagnosis not present

## 2019-04-10 DIAGNOSIS — F419 Anxiety disorder, unspecified: Secondary | ICD-10-CM | POA: Diagnosis not present

## 2019-04-10 DIAGNOSIS — Z125 Encounter for screening for malignant neoplasm of prostate: Secondary | ICD-10-CM | POA: Diagnosis not present

## 2019-04-10 DIAGNOSIS — L989 Disorder of the skin and subcutaneous tissue, unspecified: Secondary | ICD-10-CM | POA: Diagnosis not present

## 2019-04-10 DIAGNOSIS — E782 Mixed hyperlipidemia: Secondary | ICD-10-CM | POA: Diagnosis not present

## 2019-05-08 ENCOUNTER — Other Ambulatory Visit: Payer: Self-pay

## 2019-05-08 ENCOUNTER — Ambulatory Visit (INDEPENDENT_AMBULATORY_CARE_PROVIDER_SITE_OTHER): Payer: Medicare HMO | Admitting: Psychiatry

## 2019-05-08 DIAGNOSIS — F331 Major depressive disorder, recurrent, moderate: Secondary | ICD-10-CM

## 2019-05-08 DIAGNOSIS — Z636 Dependent relative needing care at home: Secondary | ICD-10-CM | POA: Diagnosis not present

## 2019-05-08 DIAGNOSIS — F4010 Social phobia, unspecified: Secondary | ICD-10-CM

## 2019-05-08 DIAGNOSIS — G9332 Myalgic encephalomyelitis/chronic fatigue syndrome: Secondary | ICD-10-CM

## 2019-05-08 DIAGNOSIS — R5382 Chronic fatigue, unspecified: Secondary | ICD-10-CM | POA: Diagnosis not present

## 2019-05-08 DIAGNOSIS — F422 Mixed obsessional thoughts and acts: Secondary | ICD-10-CM

## 2019-05-08 NOTE — Progress Notes (Signed)
Psychotherapy Progress Note [facility] Luan Moore, PhD LP  Patient ID: Adam Meyers     MRN: [id number]     Therapy format: Individual psychotherapy Date: 05/08/2019     Start: 3:13p Stop: 4:00p Time Spent: 47 min Location: telehealth   Telehealth visit -- I connected with this patient by an approved telecommunication method (audio only), with his informed consent, and verifying identity and patient privacy.  I was located at my office and patient at his home.  As needed, we discussed the limitations, risks, and security and privacy concerns associated with telehealth service, including the availability and conditions which currently govern in-person appointments and the possibility that 3rd-party payment may not be fully guaranteed and he may be responsible for charges.  After he indicated understanding, we proceeded with the session.  Also discussed treatment planning, as needed, including ongoing verbal agreement with the plan, the opportunity to ask and answer all questions, his demonstrated understanding of instructions, and his readiness to call the office should symptoms worsen or he feels he is in a crisis state and needs more immediate and tangible assistance.  Session narrative (presenting needs, interim history, self-report of stressors and symptoms, applications of prior therapy, status changes, and interventions made in session) Went 6 weeks since last contact, Pt oversight in scheduling.  Been stressful to have care workers coming through the house for mother's needs.  Dental need looming, a cap that needs work, and his familiar dentist retired.  Found himself breaking out in sweats a couple days ago on being told he needs to come in.  Has determined these headaches he's having are stress-modulated, as well as TMJ, which can also be a factor in tinnitus and neck/shoulder pain he has.  Prior dentist had recommended a TMJ specialist, Dr. Girtha Rm, but he doesn't take  insurance, and initial eval would be nearly $400.  Overdue for cleaning, as well, none since his depressive breakdown, and only endured it before that.  On request, recommended dentist.  Put it off, but yesterday called his old physical therapist, in [place], for recommendation, and has a former associate of hers nearby at [facility].  Finally convinced by "mind-blowing" headaches.  Discussed importance of stretching, limbering, possibly heat treatment to loosen up.  Cites mother's care, the visiting workers, and stretches of being 1:1 with mother pestering, asking questions and favors of him.  "Burning out, living in a nursing home."  Sees mother regressing mentally, plus it wears on him.  Noticing problems with word-finding, eroding memory -- initially irritated by vague questions and her need to write things down but now for seeing it as decline.  "Spooky."    Encouraged to face his anxious moments with new health care mindful of the responsible effort to take care of himself, and with optimism that the initial anxiety is merely the cost of starting "the next good thing".     Therapeutic modalities: Cognitive Behavioral Therapy, Solution-Oriented/Positive Psychology and faith-sensitive  Mental Status/Observations:  Appearance:   Not assessed     Behavior:  Appropriate  Motor:  Not assessed  Speech/Language:   clear, some halting  Affect:  Not assessed  Mood:  anxious  Thought process:  normal  Thought content:    Obsessions  Sensory/Perceptual disturbances:    WNL  Orientation:  Fully oriented  Attention:  Good  Concentration:  Good  Memory:  WNL  Insight:    Good  Judgment:   Good  Impulse Control:  Good  Risk Assessment: Danger to Self: No Self-injurious Behavior: No Danger to Others: No Physical Aggression / Violence: No Duty to Warn: No Access to Firearms a concern: No  Assessment of progress:  stabilized  Diagnosis:   ICD-10-CM   1. Mixed obsessional thoughts and  acts  F42.2   2. Social anxiety disorder  F40.10   3. Caregiver stress  Z63.6   4. Chronic fatigue syndrome  R53.82   5. Major depressive disorder, recurrent episode, moderate (HCC)  F33.1     Plan:  . Carry through committing and showing up for new dentist, new physical therapist . Work on relaxing and stretching orthopedic trouble spots like neck, shoulders, back, jaw . Practice reframing phobic moments as opportunities to start the next "normal" or "good" . Other recommendations/advice as noted above . Continue to utilize previously learned skills ad lib . Maintain medication as prescribed and work faithfully with relevant prescriber(s) if any changes are desired or seem indicated . Call the clinic on-call service, present to ER, or call 911 if any life-threatening psychiatric crisis Return in about 3 weeks (around 05/29/2019) for session(s) already scheduled.  [name], PhD [name], PhD LP Clinical Psychologist, [facility] [address] [place], [place] [address] [phone]

## 2019-05-13 ENCOUNTER — Other Ambulatory Visit: Payer: Self-pay

## 2019-05-13 ENCOUNTER — Encounter: Payer: Self-pay | Admitting: Psychiatry

## 2019-05-13 ENCOUNTER — Ambulatory Visit (INDEPENDENT_AMBULATORY_CARE_PROVIDER_SITE_OTHER): Payer: Medicare HMO | Admitting: Psychiatry

## 2019-05-13 DIAGNOSIS — F4010 Social phobia, unspecified: Secondary | ICD-10-CM | POA: Diagnosis not present

## 2019-05-13 DIAGNOSIS — S39012D Strain of muscle, fascia and tendon of lower back, subsequent encounter: Secondary | ICD-10-CM | POA: Diagnosis not present

## 2019-05-13 DIAGNOSIS — F422 Mixed obsessional thoughts and acts: Secondary | ICD-10-CM | POA: Diagnosis not present

## 2019-05-13 DIAGNOSIS — M542 Cervicalgia: Secondary | ICD-10-CM | POA: Diagnosis not present

## 2019-05-13 DIAGNOSIS — R262 Difficulty in walking, not elsewhere classified: Secondary | ICD-10-CM | POA: Diagnosis not present

## 2019-05-13 DIAGNOSIS — M2560 Stiffness of unspecified joint, not elsewhere classified: Secondary | ICD-10-CM | POA: Diagnosis not present

## 2019-05-13 DIAGNOSIS — G988 Other disorders of nervous system: Secondary | ICD-10-CM | POA: Diagnosis not present

## 2019-05-13 DIAGNOSIS — Z636 Dependent relative needing care at home: Secondary | ICD-10-CM

## 2019-05-13 DIAGNOSIS — M545 Low back pain: Secondary | ICD-10-CM | POA: Diagnosis not present

## 2019-05-13 DIAGNOSIS — M6281 Muscle weakness (generalized): Secondary | ICD-10-CM | POA: Diagnosis not present

## 2019-05-13 DIAGNOSIS — M26609 Unspecified temporomandibular joint disorder, unspecified side: Secondary | ICD-10-CM

## 2019-05-13 DIAGNOSIS — R519 Headache, unspecified: Secondary | ICD-10-CM | POA: Diagnosis not present

## 2019-05-13 MED ORDER — DIAZEPAM 5 MG PO TABS: ORAL_TABLET | ORAL | 2 refills | Status: DC

## 2019-05-13 NOTE — Progress Notes (Signed)
Adam LowJames K Tencza [id number] 04/27/1961 58 y.o.  Virtual Visit via WebEX  I connected with pt by WebEx and verified that I am speaking with the correct person using two identifiers.   I discussed the limitations, risks, security and privacy concerns of performing an evaluation and management service by [place] [place] and the availability of in person appointments. I also discussed with the patient that there may be a patient responsible charge related to this service. The patient expressed understanding and agreed to proceed.  I discussed the assessment and treatment plan with the patient. The patient was provided an opportunity to ask questions and all were answered. The patient agreed with the plan and demonstrated an understanding of the instructions.   The patient was advised to call back or seek an in-person evaluation if the symptoms worsen or if the condition fails to improve as anticipated.  I provided 30 minutes of video time during this encounter. The call started at 300 and ended at 3:30. The patient was located at home and the provider was located office.   Subjective:   Patient ID:  Adam LowJames K Meyers is a 58 y.o. (DOB 11/23/1960) male.  Chief Complaint:  Chief Complaint  Patient presents with  . Follow-up    Medication Management  . Other    Mixed obsessional thoughts and acts  . Depression    Medication Management  . Stress    caretaking mother  . Anxiety    HPI Adam LowJames K Gullick presents for follow-up of OCD and anxiety.  Last seen May 2020.  No meds were changed at the time.  He was taking Luvox 150 mg a day and diazepam 5 mg at night for sleep  Stress living with mother.  Hosp for a month with cardiac and stroke problems.  Minor stroke.  Caretaking mother  Worsening TMJ constant and tinnitus.  Fear of the dentist now.  Mother wears him out.  TMJ may be from stress.  M repeats herself.  M diagnosed with dementia.    Was taking diazepam 5 hs for sleep.  Then did ok without it and  stopped it.  Overall Ok.  Shopping stressful with OCD and Covid.  Wears me out. Can go 2 times weekly.  It triggers him. Not triggered more washing or cleaning rituals. Anniv of father's passing and get a bit down over that and living with mother. And confined. Patient reports stable mood and denies depressed or irritable moods.  Patient denies difficulty with sleep initiation or maintenance. 10-11 hours typical lately.  Brain won't work with less. Strange dreams.  Denies appetite disturbance.  Patient reports that energy and motivation have been good.  Patient denies any difficulty with concentration.  Patient denies any suicidal ideation.  Question about March episode with fatigue and HA.  Wonders if had Covid.  Past Psychiatric Medication Trials: Paxil Sex SE  Afraid of Zoloft bc mother hallucinated on it. Luvox 200 cog SE  pindolol, propranolol, diazepam   Review of Systems:  Review of Systems  HENT: Negative for postnasal drip, rhinorrhea and sneezing.        Jaw pain  Neurological: Negative for tremors and weakness.  Psychiatric/Behavioral: Positive for depression.    Medications: I have reviewed the patient's current medications.  Current Outpatient Medications  Medication Sig Dispense Refill  . Cholecalciferol (VITAMIN D) 50 MCG (2000 UT) CAPS Take by mouth.    . diazepam (VALIUM) 5 MG tablet TAKE 1 TABLET BY MOUTH THREE TIMES A DAY AS NEEDED  FOR ANXIETY 90 tablet 2  . famotidine (PEPCID) 20 MG tablet Take 20 mg by mouth daily as needed for heartburn or indigestion.    . fluvoxaMINE (LUVOX) 100 MG tablet TAKE 1/2 TAB BY MOUTH IN THE MORNING AND 1 AND 1/2 TAB AT BEDTIME (Patient taking differently: TAKE 1/2 TAB BY MOUTH IN THE MORNING AND 1 TAB AT BEDTIME) 60 tablet 2  . ibuprofen (ADVIL) 200 MG tablet Take 600 mg by mouth every 8 (eight) hours as needed for moderate pain.      No current facility-administered medications for this visit.     Medication Side Effects:  None  Allergies:  Allergies  Allergen Reactions  . Oxycodone-Acetaminophen Itching  . Seasonal Ic [Cholestatin]     Drainage,itchy eyes  . Gabapentin Anxiety  . Percocet [Oxycodone-Acetaminophen] Itching and Anxiety    Past Medical History:  Diagnosis Date  . Compressed cervical disc   . Cough   . Depression   . Hyperlipidemia   . Nasal congestion   . Social anxiety disorder 04/10/2018    Family History  Problem Relation Age of Onset  . Cancer Maternal Grandmother        melanoma    Social History   Socioeconomic History  . Marital status: Married    Spouse name: Not on file  . Number of children: Not on file  . Years of education: Not on file  . Highest education level: Not on file  Occupational History  . Not on file  Social Needs  . Financial resource strain: Not on file  . Food insecurity    Worry: Not on file    Inability: Not on file  . Transportation needs    Medical: Not on file    Non-medical: Not on file  Tobacco Use  . Smoking status: Never Smoker  . Smokeless tobacco: Never Used  Substance and Sexual Activity  . Alcohol use: No  . Drug use: No  . Sexual activity: Not on file  Lifestyle  . Physical activity    Days per week: Not on file    Minutes per session: Not on file  . Stress: Not on file  Relationships  . Social Musician on phone: Not on file    Gets together: Not on file    Attends religious service: Not on file    Active member of club or organization: Not on file    Attends meetings of clubs or organizations: Not on file    Relationship status: Not on file  . Intimate partner violence    Fear of current or ex partner: Not on file    Emotionally abused: Not on file    Physically abused: Not on file    Forced sexual activity: Not on file  Other Topics Concern  . Not on file  Social History Narrative  . Not on file    Past Medical History, Surgical history, Social history, and Family history were reviewed and  updated as appropriate.   Please see review of systems for further details on the patient's review from today.   Objective:   Physical Exam:  There were no vitals taken for this visit.  Physical Exam Neurological:     Mental Status: He is alert and oriented to person, place, and time.     Cranial Nerves: No dysarthria.  Psychiatric:        Attention and Perception: Attention normal. He does not perceive auditory hallucinations.  Mood and Affect: Mood is anxious. Mood is not depressed.        Speech: Speech normal.        Behavior: Behavior is cooperative.        Thought Content: Thought content normal. Thought content is not paranoid or delusional. Thought content does not include homicidal or suicidal ideation. Thought content does not include homicidal or suicidal plan.        Cognition and Memory: Cognition and memory normal.        Judgment: Judgment normal.     Comments: Residual chronic OCD but he feels it's manageable.     Lab Review:  No results found for: NA, K, CL, CO2, GLUCOSE, BUN, CREATININE, CALCIUM, PROT, ALBUMIN, AST, ALT, ALKPHOS, BILITOT, GFRNONAA, GFRAA  No results found for: WBC, RBC, HGB, HCT, PLT, MCV, MCH, MCHC, RDW, LYMPHSABS, MONOABS, EOSABS, BASOSABS  No results found for: POCLITH, LITHIUM   No results found for: PHENYTOIN, PHENOBARB, VALPROATE, CBMZ   .res Assessment: Plan:    Mixed obsessional thoughts and acts - Plan: diazepam (VALIUM) 5 MG tablet, DISCONTINUED: diazepam (VALIUM) 5 MG tablet  Social anxiety disorder - Plan: diazepam (VALIUM) 5 MG tablet, DISCONTINUED: diazepam (VALIUM) 5 MG tablet  Caregiver stress - Plan: diazepam (VALIUM) 5 MG tablet, DISCONTINUED: diazepam (VALIUM) 5 MG tablet  Sensory hypersensitivity  TMJ disease - Plan: diazepam (VALIUM) 5 MG tablet, DISCONTINUED: diazepam (VALIUM) 5 MG tablet   Yvone Neu has a long history of OCD and a history of depression as well and first came to our practice and April 2014.  He  continues in therapy with Dr. Faylene Kurtz.  While he has made a lot of progress and is much less distressed by his OCD.  He remains disabled because of time requirements of the OCD and he is easily triggered into compulsive rituals with high levels of anxiety.  With his limited life demands and relative isolation he manages acceptably well.  He does not want to take take more SSRI medication because of sexual side effects are worse at higher dosages.  Continue meds.  Benefit from meds. Rec Cont it longterm bc high relapse risk. Increase diazepam if needed for TMJ.     Option increase fluvoxamine but he complains grogginess at higher dosage. Option change SSRI back to Paxil.  He's afraid of Zoloft bc mother had a bad time with it.  Option switch back to Paxil bc he admits it was a better antianxiety med. He's afraid of switching meds.  Disc SE.  FU 3 mos   Lynder Parents, MD, DFAPA   Please see After Visit Summary for patient specific instructions.  Future Appointments  Date Time Provider [place]  05/29/2019  4:00 PM Blanchie Serve, PhD CP-CP None  06/26/2019  4:00 PM Blanchie Serve, PhD CP-CP None    No orders of the defined types were placed in this encounter.     -------------------------------

## 2019-05-14 DIAGNOSIS — M6281 Muscle weakness (generalized): Secondary | ICD-10-CM | POA: Diagnosis not present

## 2019-05-14 DIAGNOSIS — M542 Cervicalgia: Secondary | ICD-10-CM | POA: Diagnosis not present

## 2019-05-14 DIAGNOSIS — M545 Low back pain: Secondary | ICD-10-CM | POA: Diagnosis not present

## 2019-05-14 DIAGNOSIS — M2560 Stiffness of unspecified joint, not elsewhere classified: Secondary | ICD-10-CM | POA: Diagnosis not present

## 2019-05-14 DIAGNOSIS — S39012D Strain of muscle, fascia and tendon of lower back, subsequent encounter: Secondary | ICD-10-CM | POA: Diagnosis not present

## 2019-05-14 DIAGNOSIS — R262 Difficulty in walking, not elsewhere classified: Secondary | ICD-10-CM | POA: Diagnosis not present

## 2019-05-14 DIAGNOSIS — R519 Headache, unspecified: Secondary | ICD-10-CM | POA: Diagnosis not present

## 2019-05-15 DIAGNOSIS — M6281 Muscle weakness (generalized): Secondary | ICD-10-CM | POA: Diagnosis not present

## 2019-05-15 DIAGNOSIS — M545 Low back pain: Secondary | ICD-10-CM | POA: Diagnosis not present

## 2019-05-15 DIAGNOSIS — S39012D Strain of muscle, fascia and tendon of lower back, subsequent encounter: Secondary | ICD-10-CM | POA: Diagnosis not present

## 2019-05-15 DIAGNOSIS — R262 Difficulty in walking, not elsewhere classified: Secondary | ICD-10-CM | POA: Diagnosis not present

## 2019-05-15 DIAGNOSIS — R519 Headache, unspecified: Secondary | ICD-10-CM | POA: Diagnosis not present

## 2019-05-15 DIAGNOSIS — M542 Cervicalgia: Secondary | ICD-10-CM | POA: Diagnosis not present

## 2019-05-15 DIAGNOSIS — M2560 Stiffness of unspecified joint, not elsewhere classified: Secondary | ICD-10-CM | POA: Diagnosis not present

## 2019-05-19 DIAGNOSIS — M2560 Stiffness of unspecified joint, not elsewhere classified: Secondary | ICD-10-CM | POA: Diagnosis not present

## 2019-05-19 DIAGNOSIS — M542 Cervicalgia: Secondary | ICD-10-CM | POA: Diagnosis not present

## 2019-05-19 DIAGNOSIS — M545 Low back pain: Secondary | ICD-10-CM | POA: Diagnosis not present

## 2019-05-19 DIAGNOSIS — R519 Headache, unspecified: Secondary | ICD-10-CM | POA: Diagnosis not present

## 2019-05-19 DIAGNOSIS — S39012D Strain of muscle, fascia and tendon of lower back, subsequent encounter: Secondary | ICD-10-CM | POA: Diagnosis not present

## 2019-05-19 DIAGNOSIS — R262 Difficulty in walking, not elsewhere classified: Secondary | ICD-10-CM | POA: Diagnosis not present

## 2019-05-19 DIAGNOSIS — M6281 Muscle weakness (generalized): Secondary | ICD-10-CM | POA: Diagnosis not present

## 2019-05-20 DIAGNOSIS — M545 Low back pain: Secondary | ICD-10-CM | POA: Diagnosis not present

## 2019-05-20 DIAGNOSIS — M2560 Stiffness of unspecified joint, not elsewhere classified: Secondary | ICD-10-CM | POA: Diagnosis not present

## 2019-05-20 DIAGNOSIS — R262 Difficulty in walking, not elsewhere classified: Secondary | ICD-10-CM | POA: Diagnosis not present

## 2019-05-20 DIAGNOSIS — M6281 Muscle weakness (generalized): Secondary | ICD-10-CM | POA: Diagnosis not present

## 2019-05-20 DIAGNOSIS — R519 Headache, unspecified: Secondary | ICD-10-CM | POA: Diagnosis not present

## 2019-05-20 DIAGNOSIS — M542 Cervicalgia: Secondary | ICD-10-CM | POA: Diagnosis not present

## 2019-05-20 DIAGNOSIS — S39012D Strain of muscle, fascia and tendon of lower back, subsequent encounter: Secondary | ICD-10-CM | POA: Diagnosis not present

## 2019-05-21 DIAGNOSIS — M6281 Muscle weakness (generalized): Secondary | ICD-10-CM | POA: Diagnosis not present

## 2019-05-21 DIAGNOSIS — R519 Headache, unspecified: Secondary | ICD-10-CM | POA: Diagnosis not present

## 2019-05-21 DIAGNOSIS — S39012D Strain of muscle, fascia and tendon of lower back, subsequent encounter: Secondary | ICD-10-CM | POA: Diagnosis not present

## 2019-05-21 DIAGNOSIS — R262 Difficulty in walking, not elsewhere classified: Secondary | ICD-10-CM | POA: Diagnosis not present

## 2019-05-21 DIAGNOSIS — M2560 Stiffness of unspecified joint, not elsewhere classified: Secondary | ICD-10-CM | POA: Diagnosis not present

## 2019-05-21 DIAGNOSIS — M545 Low back pain: Secondary | ICD-10-CM | POA: Diagnosis not present

## 2019-05-21 DIAGNOSIS — M542 Cervicalgia: Secondary | ICD-10-CM | POA: Diagnosis not present

## 2019-05-26 DIAGNOSIS — M2560 Stiffness of unspecified joint, not elsewhere classified: Secondary | ICD-10-CM | POA: Diagnosis not present

## 2019-05-26 DIAGNOSIS — M6281 Muscle weakness (generalized): Secondary | ICD-10-CM | POA: Diagnosis not present

## 2019-05-26 DIAGNOSIS — M545 Low back pain: Secondary | ICD-10-CM | POA: Diagnosis not present

## 2019-05-26 DIAGNOSIS — R262 Difficulty in walking, not elsewhere classified: Secondary | ICD-10-CM | POA: Diagnosis not present

## 2019-05-26 DIAGNOSIS — M542 Cervicalgia: Secondary | ICD-10-CM | POA: Diagnosis not present

## 2019-05-26 DIAGNOSIS — S39012D Strain of muscle, fascia and tendon of lower back, subsequent encounter: Secondary | ICD-10-CM | POA: Diagnosis not present

## 2019-05-26 DIAGNOSIS — R519 Headache, unspecified: Secondary | ICD-10-CM | POA: Diagnosis not present

## 2019-05-28 DIAGNOSIS — M542 Cervicalgia: Secondary | ICD-10-CM | POA: Diagnosis not present

## 2019-05-28 DIAGNOSIS — M6281 Muscle weakness (generalized): Secondary | ICD-10-CM | POA: Diagnosis not present

## 2019-05-28 DIAGNOSIS — R519 Headache, unspecified: Secondary | ICD-10-CM | POA: Diagnosis not present

## 2019-05-28 DIAGNOSIS — M2560 Stiffness of unspecified joint, not elsewhere classified: Secondary | ICD-10-CM | POA: Diagnosis not present

## 2019-05-28 DIAGNOSIS — M545 Low back pain: Secondary | ICD-10-CM | POA: Diagnosis not present

## 2019-05-28 DIAGNOSIS — S39012D Strain of muscle, fascia and tendon of lower back, subsequent encounter: Secondary | ICD-10-CM | POA: Diagnosis not present

## 2019-05-28 DIAGNOSIS — R262 Difficulty in walking, not elsewhere classified: Secondary | ICD-10-CM | POA: Diagnosis not present

## 2019-05-29 ENCOUNTER — Ambulatory Visit (INDEPENDENT_AMBULATORY_CARE_PROVIDER_SITE_OTHER): Payer: Medicare HMO | Admitting: Psychiatry

## 2019-05-29 ENCOUNTER — Other Ambulatory Visit: Payer: Self-pay

## 2019-05-29 DIAGNOSIS — G988 Other disorders of nervous system: Secondary | ICD-10-CM | POA: Diagnosis not present

## 2019-05-29 DIAGNOSIS — R5382 Chronic fatigue, unspecified: Secondary | ICD-10-CM

## 2019-05-29 DIAGNOSIS — Z636 Dependent relative needing care at home: Secondary | ICD-10-CM

## 2019-05-29 DIAGNOSIS — F4010 Social phobia, unspecified: Secondary | ICD-10-CM

## 2019-05-29 DIAGNOSIS — F422 Mixed obsessional thoughts and acts: Secondary | ICD-10-CM | POA: Diagnosis not present

## 2019-05-29 DIAGNOSIS — F331 Major depressive disorder, recurrent, moderate: Secondary | ICD-10-CM

## 2019-05-29 DIAGNOSIS — G9332 Myalgic encephalomyelitis/chronic fatigue syndrome: Secondary | ICD-10-CM

## 2019-05-29 NOTE — Progress Notes (Signed)
Psychotherapy Progress Note [facility] Luan Moore, PhD LP  Patient ID: Adam Meyers     MRN: [id number]     Therapy format: Individual psychotherapy Date: 05/29/2019     Start: 4:22p Stop: 5:12p Time Spent: 50 min Location: Telehealth visit -- I connected with this patient by an approved telecommunication method (audio only), with his informed consent, and verifying identity and patient privacy.  I was located at my office and patient at his car.  As needed, we discussed the limitations, risks, and security and privacy concerns associated with telehealth service, including the availability and conditions which currently govern in-person appointments and the possibility that 3rd-party payment may not be fully guaranteed and he may be responsible for charges.  After he indicated understanding, we proceeded with the session.  Also discussed treatment planning, as needed, including ongoing verbal agreement with the plan, the opportunity to ask and answer all questions, his demonstrated understanding of instructions, and his readiness to call the office should symptoms worsen or he feels he is in a crisis state and needs more immediate and tangible assistance.   Session narrative (presenting needs, interim history, self-report of stressors and symptoms, applications of prior therapy, status changes, and interventions made in session) TG disappointing, "different" for pandemic limitations.  Back in physical therapy now, overcoming reluctance to meet a new therapist about a year after suspended service for personal circumstances and beloved therapist Jarrett Soho.  Male therapist Mitzi Hansen) recommended by her, is proving himself to be positive and capable, T making the adjustment, and a familiar place.  Irritated with people who do not mask appropriately.    Considering returning to in-person church services [facility]).  Good confidence in their precautions -- 50% capacity, mask mandate, hand  sanitizing, prompt exit after services, but leery of bringing home viruss and infecting mother.  Struggling with mother's way of pestering.  House less crowded with physical therapy once a week now, sister coming in daily to take care of mother's personal care (sitz bath, fresh diaper).  Well-settled, rare balking from [place], can assert himself but less need.  Mother going through briefs a lot more lately, not sure why.  Suggested to be emotional.  Deferring going to dentist (cleaning several years overdue).  Discussed for a bit of time as practice confronting anxiety, letting it be present, and breathing down tension while letting the subject be present.  Headaches have abated with physical therapy and clearance to use diazepam available.  Made it to [employer] store yesterday, which was relatively crowded.  Wore gloves.  Did not break out in sweat, did not obsess afterward.  Affirmed/encouraged.  Therapeutic modalities: Cognitive Behavioral Therapy, Solution-Oriented/Positive Psychology and Faith-sensitive  Mental Status/Observations:  Appearance:   Not assessed     Behavior:  Appropriate  Motor:  Not assessed  Speech/Language:   Clear and Coherent  Affect:  Not assessed  Mood:  anxious  Thought process:  normal  Thought content:    Obsessions  Sensory/Perceptual disturbances:    WNL  Orientation:  Fully oriented  Attention:  Good  Concentration:  Good  Memory:  WNL  Insight:    Good  Judgment:   Good  Impulse Control:  Fair   Risk Assessment: Danger to Self: No Self-injurious Behavior: No Danger to Others: No Physical Aggression / Violence: No Duty to Warn: No Access to Firearms a concern: No  Assessment of progress:  progressing  Diagnosis:   ICD-10-CM   1. Mixed obsessional thoughts and acts  F42.2   2. Social anxiety disorder  F40.10   3. Caregiver stress  Z63.6   4. Chronic fatigue syndrome  R53.82   5. 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 Major depressive disorder,  recurrent episode, moderate (HCC)  F33.1     Plan:  . Continue ad lib exposure, including by seeing through new physical therapy relationship and seeing through dental  . Try to agonize less about mother's pestering,  . Other recommendations/advice as noted above . Continue to utilize previously learned skills ad lib . Maintain medication as prescribed and work faithfully with relevant prescriber(s) if any changes are desired or seem indicated . Call the clinic on-call service, present to ER, or call 911 if any life-threatening psychiatric crisis Return in about 2 weeks (around 06/12/2019).  [name], PhD  [name], PhD LP Clinical Psychologist, [facility] [address] [place], [place] [address] [phone]

## 2019-06-02 DIAGNOSIS — M545 Low back pain: Secondary | ICD-10-CM | POA: Diagnosis not present

## 2019-06-02 DIAGNOSIS — S39012D Strain of muscle, fascia and tendon of lower back, subsequent encounter: Secondary | ICD-10-CM | POA: Diagnosis not present

## 2019-06-02 DIAGNOSIS — R519 Headache, unspecified: Secondary | ICD-10-CM | POA: Diagnosis not present

## 2019-06-02 DIAGNOSIS — M6281 Muscle weakness (generalized): Secondary | ICD-10-CM | POA: Diagnosis not present

## 2019-06-02 DIAGNOSIS — M542 Cervicalgia: Secondary | ICD-10-CM | POA: Diagnosis not present

## 2019-06-02 DIAGNOSIS — R262 Difficulty in walking, not elsewhere classified: Secondary | ICD-10-CM | POA: Diagnosis not present

## 2019-06-02 DIAGNOSIS — M2560 Stiffness of unspecified joint, not elsewhere classified: Secondary | ICD-10-CM | POA: Diagnosis not present

## 2019-06-05 DIAGNOSIS — M2560 Stiffness of unspecified joint, not elsewhere classified: Secondary | ICD-10-CM | POA: Diagnosis not present

## 2019-06-05 DIAGNOSIS — M6281 Muscle weakness (generalized): Secondary | ICD-10-CM | POA: Diagnosis not present

## 2019-06-05 DIAGNOSIS — R519 Headache, unspecified: Secondary | ICD-10-CM | POA: Diagnosis not present

## 2019-06-05 DIAGNOSIS — M545 Low back pain: Secondary | ICD-10-CM | POA: Diagnosis not present

## 2019-06-05 DIAGNOSIS — S39012D Strain of muscle, fascia and tendon of lower back, subsequent encounter: Secondary | ICD-10-CM | POA: Diagnosis not present

## 2019-06-05 DIAGNOSIS — R262 Difficulty in walking, not elsewhere classified: Secondary | ICD-10-CM | POA: Diagnosis not present

## 2019-06-05 DIAGNOSIS — M542 Cervicalgia: Secondary | ICD-10-CM | POA: Diagnosis not present

## 2019-06-09 DIAGNOSIS — M6281 Muscle weakness (generalized): Secondary | ICD-10-CM | POA: Diagnosis not present

## 2019-06-09 DIAGNOSIS — M542 Cervicalgia: Secondary | ICD-10-CM | POA: Diagnosis not present

## 2019-06-09 DIAGNOSIS — R262 Difficulty in walking, not elsewhere classified: Secondary | ICD-10-CM | POA: Diagnosis not present

## 2019-06-09 DIAGNOSIS — M545 Low back pain: Secondary | ICD-10-CM | POA: Diagnosis not present

## 2019-06-09 DIAGNOSIS — M2560 Stiffness of unspecified joint, not elsewhere classified: Secondary | ICD-10-CM | POA: Diagnosis not present

## 2019-06-09 DIAGNOSIS — S39012D Strain of muscle, fascia and tendon of lower back, subsequent encounter: Secondary | ICD-10-CM | POA: Diagnosis not present

## 2019-06-09 DIAGNOSIS — R519 Headache, unspecified: Secondary | ICD-10-CM | POA: Diagnosis not present

## 2019-06-12 DIAGNOSIS — S39012D Strain of muscle, fascia and tendon of lower back, subsequent encounter: Secondary | ICD-10-CM | POA: Diagnosis not present

## 2019-06-12 DIAGNOSIS — M2560 Stiffness of unspecified joint, not elsewhere classified: Secondary | ICD-10-CM | POA: Diagnosis not present

## 2019-06-12 DIAGNOSIS — R519 Headache, unspecified: Secondary | ICD-10-CM | POA: Diagnosis not present

## 2019-06-12 DIAGNOSIS — M6281 Muscle weakness (generalized): Secondary | ICD-10-CM | POA: Diagnosis not present

## 2019-06-12 DIAGNOSIS — M545 Low back pain: Secondary | ICD-10-CM | POA: Diagnosis not present

## 2019-06-12 DIAGNOSIS — R262 Difficulty in walking, not elsewhere classified: Secondary | ICD-10-CM | POA: Diagnosis not present

## 2019-06-12 DIAGNOSIS — M542 Cervicalgia: Secondary | ICD-10-CM | POA: Diagnosis not present

## 2019-06-17 DIAGNOSIS — M2560 Stiffness of unspecified joint, not elsewhere classified: Secondary | ICD-10-CM | POA: Diagnosis not present

## 2019-06-17 DIAGNOSIS — R519 Headache, unspecified: Secondary | ICD-10-CM | POA: Diagnosis not present

## 2019-06-17 DIAGNOSIS — R262 Difficulty in walking, not elsewhere classified: Secondary | ICD-10-CM | POA: Diagnosis not present

## 2019-06-17 DIAGNOSIS — S39012D Strain of muscle, fascia and tendon of lower back, subsequent encounter: Secondary | ICD-10-CM | POA: Diagnosis not present

## 2019-06-17 DIAGNOSIS — M542 Cervicalgia: Secondary | ICD-10-CM | POA: Diagnosis not present

## 2019-06-17 DIAGNOSIS — M6281 Muscle weakness (generalized): Secondary | ICD-10-CM | POA: Diagnosis not present

## 2019-06-17 DIAGNOSIS — M545 Low back pain: Secondary | ICD-10-CM | POA: Diagnosis not present

## 2019-06-23 DIAGNOSIS — M542 Cervicalgia: Secondary | ICD-10-CM | POA: Diagnosis not present

## 2019-06-23 DIAGNOSIS — R262 Difficulty in walking, not elsewhere classified: Secondary | ICD-10-CM | POA: Diagnosis not present

## 2019-06-23 DIAGNOSIS — M2560 Stiffness of unspecified joint, not elsewhere classified: Secondary | ICD-10-CM | POA: Diagnosis not present

## 2019-06-23 DIAGNOSIS — S39012D Strain of muscle, fascia and tendon of lower back, subsequent encounter: Secondary | ICD-10-CM | POA: Diagnosis not present

## 2019-06-23 DIAGNOSIS — M6281 Muscle weakness (generalized): Secondary | ICD-10-CM | POA: Diagnosis not present

## 2019-06-23 DIAGNOSIS — R519 Headache, unspecified: Secondary | ICD-10-CM | POA: Diagnosis not present

## 2019-06-23 DIAGNOSIS — M545 Low back pain: Secondary | ICD-10-CM | POA: Diagnosis not present

## 2019-06-25 DIAGNOSIS — R262 Difficulty in walking, not elsewhere classified: Secondary | ICD-10-CM | POA: Diagnosis not present

## 2019-06-25 DIAGNOSIS — R519 Headache, unspecified: Secondary | ICD-10-CM | POA: Diagnosis not present

## 2019-06-25 DIAGNOSIS — M2560 Stiffness of unspecified joint, not elsewhere classified: Secondary | ICD-10-CM | POA: Diagnosis not present

## 2019-06-25 DIAGNOSIS — M545 Low back pain: Secondary | ICD-10-CM | POA: Diagnosis not present

## 2019-06-25 DIAGNOSIS — S39012D Strain of muscle, fascia and tendon of lower back, subsequent encounter: Secondary | ICD-10-CM | POA: Diagnosis not present

## 2019-06-25 DIAGNOSIS — M542 Cervicalgia: Secondary | ICD-10-CM | POA: Diagnosis not present

## 2019-06-25 DIAGNOSIS — M6281 Muscle weakness (generalized): Secondary | ICD-10-CM | POA: Diagnosis not present

## 2019-06-26 ENCOUNTER — Ambulatory Visit (INDEPENDENT_AMBULATORY_CARE_PROVIDER_SITE_OTHER): Payer: Medicare HMO | Admitting: Psychiatry

## 2019-06-26 DIAGNOSIS — G8929 Other chronic pain: Secondary | ICD-10-CM | POA: Diagnosis not present

## 2019-06-26 DIAGNOSIS — Z636 Dependent relative needing care at home: Secondary | ICD-10-CM

## 2019-06-26 DIAGNOSIS — F422 Mixed obsessional thoughts and acts: Secondary | ICD-10-CM | POA: Diagnosis not present

## 2019-06-26 DIAGNOSIS — R5382 Chronic fatigue, unspecified: Secondary | ICD-10-CM | POA: Diagnosis not present

## 2019-06-26 DIAGNOSIS — F4010 Social phobia, unspecified: Secondary | ICD-10-CM

## 2019-06-26 DIAGNOSIS — Z630 Problems in relationship with spouse or partner: Secondary | ICD-10-CM

## 2019-06-26 DIAGNOSIS — M549 Dorsalgia, unspecified: Secondary | ICD-10-CM

## 2019-06-26 DIAGNOSIS — G9332 Myalgic encephalomyelitis/chronic fatigue syndrome: Secondary | ICD-10-CM

## 2019-06-26 NOTE — Progress Notes (Signed)
Psychotherapy Progress Note [facility] [name], PhD LP  Patient ID: Adam Meyers     MRN: [id number]     Therapy format: Individual psychotherapy Date: 06/26/2019      Start: 4:21p     Stop: 5:11p     Time Spent: 50 min Location: Telehealth visit -- I connected with this patient by an approved telecommunication method (audio only), with his informed consent, and verifying identity and patient privacy.  I was located at my home and patient at his car.  As needed, we discussed the limitations, risks, and security and privacy concerns associated with telehealth service, including the availability and conditions which currently govern in-person appointments and the possibility that 3rd-party payment may not be fully guaranteed and he may be responsible for charges.  After he indicated understanding, we proceeded with the session.  Also discussed treatment planning, as needed, including ongoing verbal agreement with the plan, the opportunity to ask and answer all questions, his demonstrated understanding of instructions, and his readiness to call the office should symptoms worsen or he feels he is in a crisis state and needs more immediate and tangible assistance.   Session narrative (presenting needs, interim history, self-report of stressors and symptoms, applications of prior therapy, status changes, and interventions made in session) Christmas gathering with mother, sister and family, enjoyed some adult conversation and interaction.  But stress has mounted with mother lately, bad sleep last three nights for her nattering about sister calling.  Told M she is driving him crazy and she uncharacteristically said, "Good," to which he bluffed that if she needs to do that, he can find another place to live soon.  Credible that she has problems with pestering, overtalking, and repetitiveness in addition to PT's thin patience for it and his sense of being confined.  Clear enough that  dementia may be progressing for her, although it is reportedly her premorbid personality to be nervous and repetitive.  Discussed alternative approaches to reminding and retelling her things.  Encouraged use of questions, to stimulate searching her own memory, and ways of more supportively confronting irrational helplessness (e.g., asking him to dial the phone).  In family news, ex-wife's car was repossessed a couple months ago after she got 3 months behind.  Seems to confirm longterm criticism that she does not count costs well and gets in over her head.  No further indication she is raiding [name]'s money, though they now use his account as a pass-through for loans, repayment, and grants, mainly from PT to her.  Xmas plans with [name], meanwhile, meant she had to rent a car for driving between [place] and [place], and then she again asked him for money to float her for the weekend.  Predictably, she has not made good on her pledge.  Seems like every time they have a nice time, she hits him up for financial help.  Support/empathy provided, discussed likely responses, noted similarity to alcoholism (says she started drinking wine more before they broke up), and affirmed [employer] principle of preferring not to loan money, be ready to just give it or don't.  Encouraged further in physical therapy, just resumed after a long hiatus, for chronic back and neck tension and deconditioning.  Therapeutic modalities: Cognitive Behavioral Therapy and Solution-Oriented/Positive Psychology  Mental Status/Observations:  Appearance:   Not assessed     Behavior:  Appropriate  Motor:  Not assessed  Speech/Language:   Clear and Coherent  Affect:  Not assessed  Mood:  anxious  Thought process:  normal, tends obsessive  Thought content:    WNL  Sensory/Perceptual disturbances:    WNL  Orientation:  Fully oriented  Attention:  Good  Concentration:  Good  Memory:  WNL  Insight:    Good  Judgment:   Good  Impulse  Control:  Fair   Risk Assessment: Danger to Self: No Self-injurious Behavior: No Danger to Others: No Physical Aggression / Violence: No Duty to Warn: No Access to Firearms a concern: No  Assessment of progress:  stabilized  Diagnosis:   ICD-10-CM   1. Mixed obsessional thoughts and acts  F42.2   2. Caregiver stress  Z63.6   3. Social anxiety disorder  F40.10   4. Chronic fatigue syndrome  R53.82   5. Relationship problem between partners  Z63.0   6. Chronic back pain, unspecified back location, unspecified back pain laterality  M54.9    G89.29    Plan:  . Try gentler, more socratic and behavioral approaches dealing with mother's pestering behavior . Maintain clarity of expectations with ex-wife . Other recommendations/advice as noted above . Continue to utilize previously learned skills ad lib . Maintain medication as prescribed and work faithfully with relevant prescriber(s) if any changes are desired or seem indicated . Call the clinic on-call service, present to ER, or call 911 if any life-threatening psychiatric crisis Return in about 3 weeks (around 07/17/2019) for session(s) already scheduled. Current [facility] appointments: Future Appointments  Date Time Provider [place]  07/16/2019  3:00 PM Blanchie Serve, PhD CP-CP None  08/13/2019  4:00 PM Cottle, Billey Co., MD CP-CP None    Blanchie Serve, PhD Luan Moore, PhD LP Clinical Psychologist, [facility] [address] [place], [place] [address] [phone]

## 2019-06-30 DIAGNOSIS — S39012D Strain of muscle, fascia and tendon of lower back, subsequent encounter: Secondary | ICD-10-CM | POA: Diagnosis not present

## 2019-06-30 DIAGNOSIS — R262 Difficulty in walking, not elsewhere classified: Secondary | ICD-10-CM | POA: Diagnosis not present

## 2019-06-30 DIAGNOSIS — M545 Low back pain: Secondary | ICD-10-CM | POA: Diagnosis not present

## 2019-06-30 DIAGNOSIS — M542 Cervicalgia: Secondary | ICD-10-CM | POA: Diagnosis not present

## 2019-06-30 DIAGNOSIS — M2560 Stiffness of unspecified joint, not elsewhere classified: Secondary | ICD-10-CM | POA: Diagnosis not present

## 2019-06-30 DIAGNOSIS — R519 Headache, unspecified: Secondary | ICD-10-CM | POA: Diagnosis not present

## 2019-06-30 DIAGNOSIS — M6281 Muscle weakness (generalized): Secondary | ICD-10-CM | POA: Diagnosis not present

## 2019-07-02 DIAGNOSIS — R262 Difficulty in walking, not elsewhere classified: Secondary | ICD-10-CM | POA: Diagnosis not present

## 2019-07-02 DIAGNOSIS — M545 Low back pain: Secondary | ICD-10-CM | POA: Diagnosis not present

## 2019-07-02 DIAGNOSIS — R519 Headache, unspecified: Secondary | ICD-10-CM | POA: Diagnosis not present

## 2019-07-02 DIAGNOSIS — M2560 Stiffness of unspecified joint, not elsewhere classified: Secondary | ICD-10-CM | POA: Diagnosis not present

## 2019-07-02 DIAGNOSIS — M542 Cervicalgia: Secondary | ICD-10-CM | POA: Diagnosis not present

## 2019-07-02 DIAGNOSIS — S39012D Strain of muscle, fascia and tendon of lower back, subsequent encounter: Secondary | ICD-10-CM | POA: Diagnosis not present

## 2019-07-02 DIAGNOSIS — M6281 Muscle weakness (generalized): Secondary | ICD-10-CM | POA: Diagnosis not present

## 2019-07-07 DIAGNOSIS — M545 Low back pain: Secondary | ICD-10-CM | POA: Diagnosis not present

## 2019-07-07 DIAGNOSIS — M542 Cervicalgia: Secondary | ICD-10-CM | POA: Diagnosis not present

## 2019-07-07 DIAGNOSIS — S39012D Strain of muscle, fascia and tendon of lower back, subsequent encounter: Secondary | ICD-10-CM | POA: Diagnosis not present

## 2019-07-07 DIAGNOSIS — M6281 Muscle weakness (generalized): Secondary | ICD-10-CM | POA: Diagnosis not present

## 2019-07-07 DIAGNOSIS — R519 Headache, unspecified: Secondary | ICD-10-CM | POA: Diagnosis not present

## 2019-07-07 DIAGNOSIS — M2560 Stiffness of unspecified joint, not elsewhere classified: Secondary | ICD-10-CM | POA: Diagnosis not present

## 2019-07-07 DIAGNOSIS — R262 Difficulty in walking, not elsewhere classified: Secondary | ICD-10-CM | POA: Diagnosis not present

## 2019-07-10 DIAGNOSIS — M545 Low back pain: Secondary | ICD-10-CM | POA: Diagnosis not present

## 2019-07-10 DIAGNOSIS — M542 Cervicalgia: Secondary | ICD-10-CM | POA: Diagnosis not present

## 2019-07-10 DIAGNOSIS — S39012D Strain of muscle, fascia and tendon of lower back, subsequent encounter: Secondary | ICD-10-CM | POA: Diagnosis not present

## 2019-07-10 DIAGNOSIS — M6281 Muscle weakness (generalized): Secondary | ICD-10-CM | POA: Diagnosis not present

## 2019-07-10 DIAGNOSIS — M2560 Stiffness of unspecified joint, not elsewhere classified: Secondary | ICD-10-CM | POA: Diagnosis not present

## 2019-07-10 DIAGNOSIS — R519 Headache, unspecified: Secondary | ICD-10-CM | POA: Diagnosis not present

## 2019-07-10 DIAGNOSIS — R262 Difficulty in walking, not elsewhere classified: Secondary | ICD-10-CM | POA: Diagnosis not present

## 2019-07-15 DIAGNOSIS — R519 Headache, unspecified: Secondary | ICD-10-CM | POA: Diagnosis not present

## 2019-07-15 DIAGNOSIS — M6281 Muscle weakness (generalized): Secondary | ICD-10-CM | POA: Diagnosis not present

## 2019-07-15 DIAGNOSIS — M2560 Stiffness of unspecified joint, not elsewhere classified: Secondary | ICD-10-CM | POA: Diagnosis not present

## 2019-07-15 DIAGNOSIS — S39012D Strain of muscle, fascia and tendon of lower back, subsequent encounter: Secondary | ICD-10-CM | POA: Diagnosis not present

## 2019-07-15 DIAGNOSIS — M545 Low back pain: Secondary | ICD-10-CM | POA: Diagnosis not present

## 2019-07-15 DIAGNOSIS — M542 Cervicalgia: Secondary | ICD-10-CM | POA: Diagnosis not present

## 2019-07-15 DIAGNOSIS — R262 Difficulty in walking, not elsewhere classified: Secondary | ICD-10-CM | POA: Diagnosis not present

## 2019-07-16 ENCOUNTER — Ambulatory Visit (INDEPENDENT_AMBULATORY_CARE_PROVIDER_SITE_OTHER): Payer: Medicare HMO | Admitting: Psychiatry

## 2019-07-16 DIAGNOSIS — F422 Mixed obsessional thoughts and acts: Secondary | ICD-10-CM | POA: Diagnosis not present

## 2019-07-16 DIAGNOSIS — G8929 Other chronic pain: Secondary | ICD-10-CM | POA: Diagnosis not present

## 2019-07-16 DIAGNOSIS — Z636 Dependent relative needing care at home: Secondary | ICD-10-CM | POA: Diagnosis not present

## 2019-07-16 DIAGNOSIS — M549 Dorsalgia, unspecified: Secondary | ICD-10-CM

## 2019-07-16 DIAGNOSIS — G9332 Myalgic encephalomyelitis/chronic fatigue syndrome: Secondary | ICD-10-CM

## 2019-07-16 DIAGNOSIS — F4010 Social phobia, unspecified: Secondary | ICD-10-CM

## 2019-07-16 DIAGNOSIS — G988 Other disorders of nervous system: Secondary | ICD-10-CM

## 2019-07-16 DIAGNOSIS — R5382 Chronic fatigue, unspecified: Secondary | ICD-10-CM | POA: Diagnosis not present

## 2019-07-16 DIAGNOSIS — F3341 Major depressive disorder, recurrent, in partial remission: Secondary | ICD-10-CM | POA: Diagnosis not present
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
## 2019-07-17 DIAGNOSIS — R519 Headache, unspecified: Secondary | ICD-10-CM | POA: Diagnosis not present

## 2019-07-17 DIAGNOSIS — M545 Low back pain: Secondary | ICD-10-CM | POA: Diagnosis not present

## 2019-07-17 DIAGNOSIS — R262 Difficulty in walking, not elsewhere classified: Secondary | ICD-10-CM | POA: Diagnosis not present

## 2019-07-17 DIAGNOSIS — S39012D Strain of muscle, fascia and tendon of lower back, subsequent encounter: Secondary | ICD-10-CM | POA: Diagnosis not present

## 2019-07-17 DIAGNOSIS — M2560 Stiffness of unspecified joint, not elsewhere classified: Secondary | ICD-10-CM | POA: Diagnosis not present

## 2019-07-17 DIAGNOSIS — M542 Cervicalgia: Secondary | ICD-10-CM | POA: Diagnosis not present

## 2019-07-17 DIAGNOSIS — M6281 Muscle weakness (generalized): Secondary | ICD-10-CM | POA: Diagnosis not present

## 2019-07-22 DIAGNOSIS — R262 Difficulty in walking, not elsewhere classified: Secondary | ICD-10-CM | POA: Diagnosis not present

## 2019-07-22 DIAGNOSIS — M545 Low back pain: Secondary | ICD-10-CM | POA: Diagnosis not present

## 2019-07-22 DIAGNOSIS — M6281 Muscle weakness (generalized): Secondary | ICD-10-CM | POA: Diagnosis not present

## 2019-07-22 DIAGNOSIS — R519 Headache, unspecified: Secondary | ICD-10-CM | POA: Diagnosis not present

## 2019-07-22 DIAGNOSIS — M2560 Stiffness of unspecified joint, not elsewhere classified: Secondary | ICD-10-CM | POA: Diagnosis not present

## 2019-07-22 DIAGNOSIS — S39012D Strain of muscle, fascia and tendon of lower back, subsequent encounter: Secondary | ICD-10-CM | POA: Diagnosis not present

## 2019-07-22 DIAGNOSIS — M542 Cervicalgia: Secondary | ICD-10-CM | POA: Diagnosis not present

## 2019-07-24 DIAGNOSIS — M545 Low back pain: Secondary | ICD-10-CM | POA: Diagnosis not present

## 2019-07-24 DIAGNOSIS — M6281 Muscle weakness (generalized): Secondary | ICD-10-CM | POA: Diagnosis not present

## 2019-07-24 DIAGNOSIS — R519 Headache, unspecified: Secondary | ICD-10-CM | POA: Diagnosis not present

## 2019-07-24 DIAGNOSIS — M542 Cervicalgia: Secondary | ICD-10-CM | POA: Diagnosis not present

## 2019-07-24 DIAGNOSIS — R262 Difficulty in walking, not elsewhere classified: Secondary | ICD-10-CM | POA: Diagnosis not present

## 2019-07-24 DIAGNOSIS — S39012D Strain of muscle, fascia and tendon of lower back, subsequent encounter: Secondary | ICD-10-CM | POA: Diagnosis not present

## 2019-07-24 DIAGNOSIS — M2560 Stiffness of unspecified joint, not elsewhere classified: Secondary | ICD-10-CM | POA: Diagnosis not present

## 2019-07-30 DIAGNOSIS — R262 Difficulty in walking, not elsewhere classified: Secondary | ICD-10-CM | POA: Diagnosis not present

## 2019-07-30 DIAGNOSIS — S39012D Strain of muscle, fascia and tendon of lower back, subsequent encounter: Secondary | ICD-10-CM | POA: Diagnosis not present

## 2019-07-30 DIAGNOSIS — M6281 Muscle weakness (generalized): Secondary | ICD-10-CM | POA: Diagnosis not present

## 2019-07-30 DIAGNOSIS — R519 Headache, unspecified: Secondary | ICD-10-CM | POA: Diagnosis not present

## 2019-07-30 DIAGNOSIS — M2560 Stiffness of unspecified joint, not elsewhere classified: Secondary | ICD-10-CM | POA: Diagnosis not present

## 2019-07-30 DIAGNOSIS — M542 Cervicalgia: Secondary | ICD-10-CM | POA: Diagnosis not present

## 2019-07-30 DIAGNOSIS — M545 Low back pain: Secondary | ICD-10-CM | POA: Diagnosis not present

## 2019-07-31 DIAGNOSIS — M542 Cervicalgia: Secondary | ICD-10-CM | POA: Diagnosis not present

## 2019-07-31 DIAGNOSIS — M2560 Stiffness of unspecified joint, not elsewhere classified: Secondary | ICD-10-CM | POA: Diagnosis not present

## 2019-07-31 DIAGNOSIS — M6281 Muscle weakness (generalized): Secondary | ICD-10-CM | POA: Diagnosis not present

## 2019-07-31 DIAGNOSIS — R262 Difficulty in walking, not elsewhere classified: Secondary | ICD-10-CM | POA: Diagnosis not present

## 2019-07-31 DIAGNOSIS — R519 Headache, unspecified: Secondary | ICD-10-CM | POA: Diagnosis not present

## 2019-07-31 DIAGNOSIS — S39012D Strain of muscle, fascia and tendon of lower back, subsequent encounter: Secondary | ICD-10-CM | POA: Diagnosis not present

## 2019-07-31 DIAGNOSIS — M545 Low back pain: Secondary | ICD-10-CM | POA: Diagnosis not present

## 2019-08-13 ENCOUNTER — Ambulatory Visit (INDEPENDENT_AMBULATORY_CARE_PROVIDER_SITE_OTHER): Payer: Medicare HMO | Admitting: Psychiatry

## 2019-08-13 ENCOUNTER — Encounter: Payer: Self-pay | Admitting: Psychiatry

## 2019-08-13 DIAGNOSIS — M549 Dorsalgia, unspecified: Secondary | ICD-10-CM

## 2019-08-13 DIAGNOSIS — F4010 Social phobia, unspecified: Secondary | ICD-10-CM | POA: Diagnosis not present

## 2019-08-13 DIAGNOSIS — G8929 Other chronic pain: Secondary | ICD-10-CM | POA: Diagnosis not present

## 2019-08-13 DIAGNOSIS — Z636 Dependent relative needing care at home: Secondary | ICD-10-CM

## 2019-08-13 DIAGNOSIS — G988 Other disorders of nervous system: Secondary | ICD-10-CM | POA: Diagnosis not present

## 2019-08-13 DIAGNOSIS — M26609 Unspecified temporomandibular joint disorder, unspecified side: Secondary | ICD-10-CM | POA: Diagnosis not present

## 2019-08-13 DIAGNOSIS — F3341 Major depressive disorder, recurrent, in partial remission: Secondary | ICD-10-CM | POA: Diagnosis not present

## 2019-08-13 DIAGNOSIS — G9332 Myalgic encephalomyelitis/chronic fatigue syndrome: Secondary | ICD-10-CM

## 2019-08-13 DIAGNOSIS — F422 Mixed obsessional thoughts and acts: Secondary | ICD-10-CM | POA: Diagnosis not present

## 2019-08-13 DIAGNOSIS — R5382 Chronic fatigue, unspecified: Secondary | ICD-10-CM | POA: Diagnosis not present

## 2019-08-13 MED ORDER — FLUVOXAMINE MALEATE 100 MG PO TABS: 150.0000 mg | ORAL_TABLET | Freq: Every day | ORAL | 1 refills | Status: DC

## 2019-08-13 MED ORDER — DIAZEPAM 5 MG PO TABS: ORAL_TABLET | ORAL | 2 refills | Status: DC
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
## 2019-08-19 DIAGNOSIS — M545 Low back pain: Secondary | ICD-10-CM | POA: Diagnosis not present

## 2019-08-19 DIAGNOSIS — M2560 Stiffness of unspecified joint, not elsewhere classified: Secondary | ICD-10-CM | POA: Diagnosis not present

## 2019-08-19 DIAGNOSIS — R519 Headache, unspecified: Secondary | ICD-10-CM | POA: Diagnosis not present

## 2019-08-19 DIAGNOSIS — M6281 Muscle weakness (generalized): Secondary | ICD-10-CM | POA: Diagnosis not present

## 2019-08-19 DIAGNOSIS — M542 Cervicalgia: Secondary | ICD-10-CM | POA: Diagnosis not present

## 2019-08-19 DIAGNOSIS — S39012D Strain of muscle, fascia and tendon of lower back, subsequent encounter: Secondary | ICD-10-CM | POA: Diagnosis not present

## 2019-08-19 DIAGNOSIS — R262 Difficulty in walking, not elsewhere classified: Secondary | ICD-10-CM | POA: Diagnosis not present

## 2019-08-20 ENCOUNTER — Ambulatory Visit (INDEPENDENT_AMBULATORY_CARE_PROVIDER_SITE_OTHER): Payer: Medicare HMO | Admitting: Psychiatry

## 2019-08-20 DIAGNOSIS — G9332 Myalgic encephalomyelitis/chronic fatigue syndrome: Secondary | ICD-10-CM

## 2019-08-20 DIAGNOSIS — F331 Major depressive disorder, recurrent, moderate: Secondary | ICD-10-CM | POA: Diagnosis not present

## 2019-08-20 DIAGNOSIS — R5382 Chronic fatigue, unspecified: Secondary | ICD-10-CM

## 2019-08-20 DIAGNOSIS — G8929 Other chronic pain: Secondary | ICD-10-CM | POA: Diagnosis not present

## 2019-08-20 DIAGNOSIS — M549 Dorsalgia, unspecified: Secondary | ICD-10-CM | POA: Diagnosis not present

## 2019-08-20 DIAGNOSIS — Z636 Dependent relative needing care at home: Secondary | ICD-10-CM | POA: Diagnosis not present

## 2019-08-20 DIAGNOSIS — F4010 Social phobia, unspecified: Secondary | ICD-10-CM

## 2019-08-20 DIAGNOSIS — F422 Mixed obsessional thoughts and acts: Secondary | ICD-10-CM
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
## 2019-08-21 DIAGNOSIS — R519 Headache, unspecified: Secondary | ICD-10-CM | POA: Diagnosis not present

## 2019-08-21 DIAGNOSIS — M2560 Stiffness of unspecified joint, not elsewhere classified: Secondary | ICD-10-CM | POA: Diagnosis not present

## 2019-08-21 DIAGNOSIS — R262 Difficulty in walking, not elsewhere classified: Secondary | ICD-10-CM | POA: Diagnosis not present

## 2019-08-21 DIAGNOSIS — M6281 Muscle weakness (generalized): Secondary | ICD-10-CM | POA: Diagnosis not present

## 2019-08-21 DIAGNOSIS — M542 Cervicalgia: Secondary | ICD-10-CM | POA: Diagnosis not present

## 2019-08-21 DIAGNOSIS — M545 Low back pain: Secondary | ICD-10-CM | POA: Diagnosis not present

## 2019-08-21 DIAGNOSIS — S39012D Strain of muscle, fascia and tendon of lower back, subsequent encounter: Secondary | ICD-10-CM | POA: Diagnosis not present

## 2019-09-02 DIAGNOSIS — R519 Headache, unspecified: Secondary | ICD-10-CM | POA: Diagnosis not present

## 2019-09-02 DIAGNOSIS — M542 Cervicalgia: Secondary | ICD-10-CM | POA: Diagnosis not present

## 2019-09-02 DIAGNOSIS — S39012D Strain of muscle, fascia and tendon of lower back, subsequent encounter: Secondary | ICD-10-CM | POA: Diagnosis not present

## 2019-09-02 DIAGNOSIS — M545 Low back pain: Secondary | ICD-10-CM | POA: Diagnosis not present

## 2019-09-02 DIAGNOSIS — M6281 Muscle weakness (generalized): Secondary | ICD-10-CM | POA: Diagnosis not present

## 2019-09-02 DIAGNOSIS — R262 Difficulty in walking, not elsewhere classified: Secondary | ICD-10-CM | POA: Diagnosis not present

## 2019-09-02 DIAGNOSIS — M2560 Stiffness of unspecified joint, not elsewhere classified: Secondary | ICD-10-CM | POA: Diagnosis not present

## 2019-09-04 DIAGNOSIS — S39012D Strain of muscle, fascia and tendon of lower back, subsequent encounter: Secondary | ICD-10-CM | POA: Diagnosis not present

## 2019-09-04 DIAGNOSIS — M6281 Muscle weakness (generalized): Secondary | ICD-10-CM | POA: Diagnosis not present

## 2019-09-04 DIAGNOSIS — M545 Low back pain: Secondary | ICD-10-CM | POA: Diagnosis not present

## 2019-09-04 DIAGNOSIS — M2560 Stiffness of unspecified joint, not elsewhere classified: Secondary | ICD-10-CM | POA: Diagnosis not present

## 2019-09-04 DIAGNOSIS — M542 Cervicalgia: Secondary | ICD-10-CM | POA: Diagnosis not present

## 2019-09-04 DIAGNOSIS — R519 Headache, unspecified: Secondary | ICD-10-CM | POA: Diagnosis not present

## 2019-09-04 DIAGNOSIS — R262 Difficulty in walking, not elsewhere classified: Secondary | ICD-10-CM | POA: Diagnosis not present

## 2019-09-05 ENCOUNTER — Other Ambulatory Visit: Payer: Self-pay

## 2019-09-05 DIAGNOSIS — F3341 Major depressive disorder, recurrent, in partial remission: Secondary | ICD-10-CM

## 2019-09-05 DIAGNOSIS — F4010 Social phobia, unspecified: Secondary | ICD-10-CM

## 2019-09-05 DIAGNOSIS — F422 Mixed obsessional thoughts and acts: Secondary | ICD-10-CM

## 2019-09-05 MED ORDER — FLUVOXAMINE MALEATE 100 MG PO TABS: ORAL_TABLET | ORAL | 1 refills | Status: DC

## 2019-09-09 DIAGNOSIS — M2560 Stiffness of unspecified joint, not elsewhere classified: Secondary | ICD-10-CM | POA: Diagnosis not present

## 2019-09-09 DIAGNOSIS — R262 Difficulty in walking, not elsewhere classified: Secondary | ICD-10-CM | POA: Diagnosis not present

## 2019-09-09 DIAGNOSIS — S39012D Strain of muscle, fascia and tendon of lower back, subsequent encounter: Secondary | ICD-10-CM | POA: Diagnosis not present

## 2019-09-09 DIAGNOSIS — R519 Headache, unspecified: Secondary | ICD-10-CM | POA: Diagnosis not present

## 2019-09-09 DIAGNOSIS — M6281 Muscle weakness (generalized): Secondary | ICD-10-CM | POA: Diagnosis not present

## 2019-09-09 DIAGNOSIS — M542 Cervicalgia: Secondary | ICD-10-CM | POA: Diagnosis not present

## 2019-09-09 DIAGNOSIS — M545 Low back pain: Secondary | ICD-10-CM | POA: Diagnosis not present

## 2019-09-10 ENCOUNTER — Ambulatory Visit: Payer: Medicare HMO | Admitting: Psychiatry

## 2019-09-10 DIAGNOSIS — M2560 Stiffness of unspecified joint, not elsewhere classified: Secondary | ICD-10-CM | POA: Diagnosis not present

## 2019-09-10 DIAGNOSIS — S39012D Strain of muscle, fascia and tendon of lower back, subsequent encounter: Secondary | ICD-10-CM | POA: Diagnosis not present

## 2019-09-10 DIAGNOSIS — M542 Cervicalgia: Secondary | ICD-10-CM | POA: Diagnosis not present

## 2019-09-10 DIAGNOSIS — M6281 Muscle weakness (generalized): Secondary | ICD-10-CM | POA: Diagnosis not present

## 2019-09-10 DIAGNOSIS — M545 Low back pain: Secondary | ICD-10-CM | POA: Diagnosis not present

## 2019-09-10 DIAGNOSIS — R519 Headache, unspecified: Secondary | ICD-10-CM | POA: Diagnosis not present

## 2019-09-10 DIAGNOSIS — R262 Difficulty in walking, not elsewhere classified: Secondary | ICD-10-CM | POA: Diagnosis not present

## 2019-09-11 ENCOUNTER — Ambulatory Visit (INDEPENDENT_AMBULATORY_CARE_PROVIDER_SITE_OTHER): Payer: Medicare HMO | Admitting: Psychiatry

## 2019-09-11 ENCOUNTER — Other Ambulatory Visit: Payer: Self-pay

## 2019-09-11 DIAGNOSIS — G9332 Myalgic encephalomyelitis/chronic fatigue syndrome: Secondary | ICD-10-CM

## 2019-09-11 DIAGNOSIS — Z638 Other specified problems related to primary support group: Secondary | ICD-10-CM | POA: Diagnosis not present

## 2019-09-11 DIAGNOSIS — G988 Other disorders of nervous system: Secondary | ICD-10-CM

## 2019-09-11 DIAGNOSIS — F422 Mixed obsessional thoughts and acts: Secondary | ICD-10-CM

## 2019-09-11 DIAGNOSIS — R5382 Chronic fatigue, unspecified: Secondary | ICD-10-CM

## 2019-09-11 NOTE — Progress Notes (Signed)
Psychotherapy Progress Note [facility] [name], PhD LP  Patient ID: Adam Meyers     MRN: [id number] Therapy format: Individual psychotherapy Date: 09/11/2019      Start: 3:16p     Stop: 4:06p     Time Spent: 50 min Location: In-person   Session narrative (presenting needs, interim history, self-report of stressors and symptoms, applications of prior therapy, status changes, and interventions made in session) Arrives with face shield, first in-person session in about a year.  Affirmed courage in following through despite pandemic anxiety and discomfort with PPE.  Requires firm chair for orthopedic comfort.  Lately feels "running on empty".  Mom fell, 4 days in hospital, 24-hr care, most of it hired out, with in-home therapies.  Sister Adam Meyers attending sometimes, reportedly complaining again lately of things she perceives not done, the state of the kitchen, alleging dereliction by PT.  Caught him in a more assertive mood, though, and he reminded her of the deal they made at the time of their father's death.  Enervating to be in conflict, but essentially proud of his handling of it.  Contrasted with previous functioning and communication during and after his breakdown several years ago and affirmed growth representing himself and summoning himself for unexpected responsibilities caring for son and mother amid disability.  Continues satisfactorily with physical therapy.  OCD challenge being asked to sign in before session, went to [facility] and washed afterward, even though he had sanitized before using the e-pen.  Mainly affected emotionally by flow of strangers through the house, though helped by night worker shutting the door.   Stress in seeing [name] into college (transfer from [employer] community college, living with his mother to [employer], living independently).  Notes ex-W [name] has been a lot nicer lately, feels more like the better old times in how they  relate, and he gets nostalgic for being married at these times and when he sees sister happily married.  Affirmed as OK to miss the good times, normalized having mixed feelings several years out from losing marriage and employment at the same time.  Asks reassurance whether it's OK to feel warmly toward [name] and still not want to remarry her (certainly).    Therapeutic modalities: Cognitive Behavioral Therapy, Solution-Oriented/Positive Psychology and Ego-Supportive  Mental Status/Observations:  Appearance:   Casual and Neat     Behavior:  Appropriate  Motor:  Normal  Speech/Language:   Clear and Coherent  Affect:  Appropriate  Mood:  anxious and moderately  Thought process:  normal  Thought content:    WNL except obsessions  Sensory/Perceptual disturbances:    WNL  Orientation:  Fully oriented  Attention:  Good  Concentration:  Good  Memory:  WNL  Insight:    Good  Judgment:   Good  Impulse Control:  Good   Risk Assessment: Danger to Self: No Self-injurious Behavior: No Danger to Others: No Physical Aggression / Violence: No Duty to Warn: No Access to Firearms a concern: No  Assessment of progress:  progressing  Diagnosis:   ICD-10-CM   1. Mixed obsessional thoughts and acts  F42.2   2. Chronic fatigue syndrome  R53.82   3. 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 Relationship problem with family member  Z63.8    Plan:  . Continue practice putting up with care workers in home and allowing any disturbed feelings to subside . Self-affirm OK to have mixed feelings toward ex-wife -- the task of adjusting is to  be big enough to hold the good with the bad, and none of it dictates working on remarrying . Other recommendations/advice as may be noted above . Continue to utilize previously learned skills ad lib . Maintain medication as prescribed and work faithfully with relevant prescriber(s) if any changes are desired or seem indicated . Call the clinic on-call service, present to  ER, or call 911 if any life-threatening psychiatric crisis . Return in about 3 weeks (around 10/02/2019).Marland Kitchen  Next scheduled visit in this office 10/15/2019.  Blanchie Serve, PhD Luan Moore, PhD LP Clinical Psychologist, [facility] [address], [place], [place] [address] [phone]

## 2019-09-16 DIAGNOSIS — R262 Difficulty in walking, not elsewhere classified: Secondary | ICD-10-CM | POA: Diagnosis not present

## 2019-09-16 DIAGNOSIS — M542 Cervicalgia: Secondary | ICD-10-CM | POA: Diagnosis not present

## 2019-09-16 DIAGNOSIS — S39012D Strain of muscle, fascia and tendon of lower back, subsequent encounter: Secondary | ICD-10-CM | POA: Diagnosis not present

## 2019-09-16 DIAGNOSIS — M2560 Stiffness of unspecified joint, not elsewhere classified: Secondary | ICD-10-CM | POA: Diagnosis not present

## 2019-09-16 DIAGNOSIS — M545 Low back pain: Secondary | ICD-10-CM | POA: Diagnosis not present

## 2019-09-16 DIAGNOSIS — R519 Headache, unspecified: Secondary | ICD-10-CM | POA: Diagnosis not present

## 2019-09-16 DIAGNOSIS — M6281 Muscle weakness (generalized): Secondary | ICD-10-CM | POA: Diagnosis not present

## 2019-09-17 DIAGNOSIS — R262 Difficulty in walking, not elsewhere classified: Secondary | ICD-10-CM | POA: Diagnosis not present

## 2019-09-17 DIAGNOSIS — R519 Headache, unspecified: Secondary | ICD-10-CM | POA: Diagnosis not present

## 2019-09-17 DIAGNOSIS — M6281 Muscle weakness (generalized): Secondary | ICD-10-CM | POA: Diagnosis not present

## 2019-09-17 DIAGNOSIS — S39012D Strain of muscle, fascia and tendon of lower back, subsequent encounter: Secondary | ICD-10-CM | POA: Diagnosis not present

## 2019-09-17 DIAGNOSIS — M2560 Stiffness of unspecified joint, not elsewhere classified: Secondary | ICD-10-CM | POA: Diagnosis not present

## 2019-09-17 DIAGNOSIS — M545 Low back pain: Secondary | ICD-10-CM | POA: Diagnosis not present

## 2019-09-17 DIAGNOSIS — M542 Cervicalgia: Secondary | ICD-10-CM | POA: Diagnosis not present

## 2019-09-25 DIAGNOSIS — R262 Difficulty in walking, not elsewhere classified: Secondary | ICD-10-CM | POA: Diagnosis not present

## 2019-09-25 DIAGNOSIS — S39012D Strain of muscle, fascia and tendon of lower back, subsequent encounter: Secondary | ICD-10-CM | POA: Diagnosis not present

## 2019-09-25 DIAGNOSIS — M545 Low back pain: Secondary | ICD-10-CM | POA: Diagnosis not present

## 2019-09-25 DIAGNOSIS — M542 Cervicalgia: Secondary | ICD-10-CM | POA: Diagnosis not present

## 2019-09-25 DIAGNOSIS — M6281 Muscle weakness (generalized): Secondary | ICD-10-CM | POA: Diagnosis not present

## 2019-09-25 DIAGNOSIS — R519 Headache, unspecified: Secondary | ICD-10-CM | POA: Diagnosis not present

## 2019-09-25 DIAGNOSIS — M2560 Stiffness of unspecified joint, not elsewhere classified: Secondary | ICD-10-CM | POA: Diagnosis not present

## 2019-09-30 DIAGNOSIS — M545 Low back pain: Secondary | ICD-10-CM | POA: Diagnosis not present

## 2019-09-30 DIAGNOSIS — S39012D Strain of muscle, fascia and tendon of lower back, subsequent encounter: Secondary | ICD-10-CM | POA: Diagnosis not present

## 2019-09-30 DIAGNOSIS — M6281 Muscle weakness (generalized): Secondary | ICD-10-CM | POA: Diagnosis not present

## 2019-09-30 DIAGNOSIS — R519 Headache, unspecified: Secondary | ICD-10-CM | POA: Diagnosis not present

## 2019-09-30 DIAGNOSIS — M542 Cervicalgia: Secondary | ICD-10-CM | POA: Diagnosis not present

## 2019-09-30 DIAGNOSIS — M2560 Stiffness of unspecified joint, not elsewhere classified: Secondary | ICD-10-CM | POA: Diagnosis not present

## 2019-09-30 DIAGNOSIS — R262 Difficulty in walking, not elsewhere classified: Secondary | ICD-10-CM | POA: Diagnosis not present

## 2019-10-02 DIAGNOSIS — R262 Difficulty in walking, not elsewhere classified: Secondary | ICD-10-CM | POA: Diagnosis not present

## 2019-10-02 DIAGNOSIS — M2560 Stiffness of unspecified joint, not elsewhere classified: Secondary | ICD-10-CM | POA: Diagnosis not present

## 2019-10-02 DIAGNOSIS — R519 Headache, unspecified: Secondary | ICD-10-CM | POA: Diagnosis not present

## 2019-10-02 DIAGNOSIS — M6281 Muscle weakness (generalized): Secondary | ICD-10-CM | POA: Diagnosis not present

## 2019-10-02 DIAGNOSIS — S39012D Strain of muscle, fascia and tendon of lower back, subsequent encounter: Secondary | ICD-10-CM | POA: Diagnosis not present

## 2019-10-02 DIAGNOSIS — M545 Low back pain: Secondary | ICD-10-CM | POA: Diagnosis not present

## 2019-10-02 DIAGNOSIS — M542 Cervicalgia: Secondary | ICD-10-CM | POA: Diagnosis not present

## 2019-10-07 DIAGNOSIS — M542 Cervicalgia: Secondary | ICD-10-CM | POA: Diagnosis not present

## 2019-10-07 DIAGNOSIS — R262 Difficulty in walking, not elsewhere classified: Secondary | ICD-10-CM | POA: Diagnosis not present

## 2019-10-07 DIAGNOSIS — M2560 Stiffness of unspecified joint, not elsewhere classified: Secondary | ICD-10-CM | POA: Diagnosis not present

## 2019-10-07 DIAGNOSIS — S39012D Strain of muscle, fascia and tendon of lower back, subsequent encounter: Secondary | ICD-10-CM | POA: Diagnosis not present

## 2019-10-07 DIAGNOSIS — M545 Low back pain: Secondary | ICD-10-CM | POA: Diagnosis not present

## 2019-10-07 DIAGNOSIS — M6281 Muscle weakness (generalized): Secondary | ICD-10-CM | POA: Diagnosis not present

## 2019-10-07 DIAGNOSIS — R519 Headache, unspecified: Secondary | ICD-10-CM | POA: Diagnosis not present

## 2019-10-09 DIAGNOSIS — M545 Low back pain: Secondary | ICD-10-CM | POA: Diagnosis not present

## 2019-10-09 DIAGNOSIS — R262 Difficulty in walking, not elsewhere classified: Secondary | ICD-10-CM | POA: Diagnosis not present

## 2019-10-09 DIAGNOSIS — M2560 Stiffness of unspecified joint, not elsewhere classified: Secondary | ICD-10-CM | POA: Diagnosis not present

## 2019-10-09 DIAGNOSIS — R519 Headache, unspecified: Secondary | ICD-10-CM | POA: Diagnosis not present

## 2019-10-09 DIAGNOSIS — S39012D Strain of muscle, fascia and tendon of lower back, subsequent encounter: Secondary | ICD-10-CM | POA: Diagnosis not present

## 2019-10-09 DIAGNOSIS — M6281 Muscle weakness (generalized): Secondary | ICD-10-CM | POA: Diagnosis not present

## 2019-10-09 DIAGNOSIS — M542 Cervicalgia: Secondary | ICD-10-CM | POA: Diagnosis not present

## 2019-10-12 ENCOUNTER — Other Ambulatory Visit: Payer: Self-pay | Admitting: Psychiatry

## 2019-10-12 DIAGNOSIS — F3341 Major depressive disorder, recurrent, in partial remission: Secondary | ICD-10-CM

## 2019-10-12 DIAGNOSIS — F4010 Social phobia, unspecified: Secondary | ICD-10-CM

## 2019-10-12 DIAGNOSIS — F422 Mixed obsessional thoughts and acts: Secondary | ICD-10-CM

## 2019-10-15 ENCOUNTER — Ambulatory Visit (INDEPENDENT_AMBULATORY_CARE_PROVIDER_SITE_OTHER): Payer: Medicare HMO | Admitting: Psychiatry

## 2019-10-15 DIAGNOSIS — F331 Major depressive disorder, recurrent, moderate: Secondary | ICD-10-CM

## 2019-10-15 DIAGNOSIS — F422 Mixed obsessional thoughts and acts: Secondary | ICD-10-CM | POA: Diagnosis not present

## 2019-10-15 DIAGNOSIS — G988 Other disorders of nervous system: Secondary | ICD-10-CM | POA: Diagnosis not present

## 2019-10-15 DIAGNOSIS — R5382 Chronic fatigue, unspecified: Secondary | ICD-10-CM | POA: Diagnosis not present

## 2019-10-15 DIAGNOSIS — G9332 Myalgic encephalomyelitis/chronic fatigue syndrome: Secondary | ICD-10-CM

## 2019-10-15 NOTE — Progress Notes (Signed)
Psychotherapy Progress Note [facility] Luan Moore, PhD LP  Patient ID: Adam Meyers     MRN: [id number] Therapy format: Individual psychotherapy Date: 10/15/2019      Start: 3:15p     Stop: 4:05p     Time Spent: 50 min Location: Telehealth visit -- I connected with this patient by an approved telecommunication method (audio only), with his informed consent, and verifying identity and patient privacy.  I was located at my office and patient at his car.  As needed, we discussed the limitations, risks, and security and privacy concerns associated with telehealth service, including the availability and conditions which currently govern in-person appointments and the possibility that 3rd-party payment may not be fully guaranteed and he may be responsible for charges.  After he indicated understanding, we proceeded with the session.  Also discussed treatment planning, as needed, including ongoing verbal agreement with the plan, the opportunity to ask and answer all questions, his demonstrated understanding of instructions, and his readiness to call the office should symptoms worsen or he feels he is in a crisis state and needs more immediate and tangible assistance.   Session narrative (presenting needs, interim history, self-report of stressors and symptoms, applications of prior therapy, status changes, and interventions made in session) Changed to telehealth due to feeling sick.  Funeral yesterday for aunt, then with things going on at home (issue with aide) Pt became extremely fatigued.  Experience yesterday that c. 25 people de-masked to eat, which made him nervous about catching COVID.  Today having allergy-like symptoms but can't be sure it's not viral.  No fever, but headache, treated with OTC Advil.  Has not been vaccinated -- was close, then J&J vaccine news broke, plus he is reckoning with moral qualms about wild allegation that there are fetal cells in the vaccines.  Set  straight about how vaccine research had to test before producing but uses descendant fetal cells propagated from abortion-related donations made decades ago only to do the testing.  Reassured.  Further empowered with the suggestion he will feel safer afterward re. COVID itself, and that personal vaccine experience has been very positive, including uncomfortable but very understandable, temporary side effects.    C/o lingering fatigue in addition to his sore throat and headache, which still fits existing issues of chronic fatigue syndrome.  In my opinion it seems to be activated by stress, particularly the frequently activated feelings of uncertainty with strong responsibility to act.  Reviewed theories that his physiology may respond either via inflammation to impair neural firing or metabolic reaction (mitochondrial suppression), and the important thing, psychosomatically, would be that he can resolve as quickly as possible to trust and accept his best instincts in the face of uncertainty, and keep showing up for moderate amounts of activities that expend energy.  Otherwise, maintain as well as possible his sleep cycle, circadian rhythm, and cycle of activity and rest.  Therapeutic modalities: Cognitive Behavioral Therapy, Solution-Oriented/Positive Psychology and Psycho-education/Bibliotherapy  Mental Status/Observations:  Appearance:   Not assessed     Behavior:  Appropriate  Motor:  Not assessed  Speech/Language:   Clear and Coherent  Affect:  Not assessed  Mood:  anxious  Thought process:  normal  Thought content:    Obsessions  Sensory/Perceptual disturbances:    WNL  Orientation:  Fully oriented  Attention:  Good    Concentration:  Fair  Memory:  WNL  Insight:    Fair  Judgment:   Good  Impulse Control:  Good   Risk Assessment: Danger to Self: No Self-injurious Behavior: No Danger to Others: No Physical Aggression / Violence: No Duty to Warn: No Access to Firearms a concern:  No  Assessment of progress:  stabilized  Diagnosis:   ICD-10-CM   1. Chronic fatigue syndrome  R53.82   2. Mixed obsessional thoughts and acts  F42.2   3. Major depressive disorder, recurrent episode, moderate (HCC)  F33.1   4. 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 Practice trust in the face of uncertainty, moderate action in the face of urge to avoid . Ensure varied enough diet to prevent vitamin deficiencies  . Recommend vaccination for COVID . Other recommendations/advice as may be noted above . Continue to utilize previously learned skills ad lib . Maintain medication as prescribed and work faithfully with relevant prescriber(s) if any changes are desired or seem indicated . Call the clinic on-call service, present to ER, or call 911 if any life-threatening psychiatric crisis Return in about 3 weeks (around 11/05/2019). . Already scheduled visit in this office 11/12/2019.  [name], PhD [name], PhD LP Clinical Psychologist, [facility] [address] [place], [place] [address] [phone]

## 2019-10-16 DIAGNOSIS — R262 Difficulty in walking, not elsewhere classified: Secondary | ICD-10-CM | POA: Diagnosis not present

## 2019-10-16 DIAGNOSIS — S39012D Strain of muscle, fascia and tendon of lower back, subsequent encounter: Secondary | ICD-10-CM | POA: Diagnosis not present

## 2019-10-16 DIAGNOSIS — M2560 Stiffness of unspecified joint, not elsewhere classified: Secondary | ICD-10-CM | POA: Diagnosis not present

## 2019-10-16 DIAGNOSIS — M545 Low back pain: Secondary | ICD-10-CM | POA: Diagnosis not present

## 2019-10-16 DIAGNOSIS — M542 Cervicalgia: Secondary | ICD-10-CM | POA: Diagnosis not present

## 2019-10-16 DIAGNOSIS — M6281 Muscle weakness (generalized): Secondary | ICD-10-CM | POA: Diagnosis not present

## 2019-10-16 DIAGNOSIS — R519 Headache, unspecified: Secondary | ICD-10-CM | POA: Diagnosis not present

## 2019-10-21 DIAGNOSIS — S39012D Strain of muscle, fascia and tendon of lower back, subsequent encounter: Secondary | ICD-10-CM | POA: Diagnosis not present

## 2019-10-21 DIAGNOSIS — R262 Difficulty in walking, not elsewhere classified: Secondary | ICD-10-CM | POA: Diagnosis not present

## 2019-10-21 DIAGNOSIS — M2560 Stiffness of unspecified joint, not elsewhere classified: Secondary | ICD-10-CM | POA: Diagnosis not present

## 2019-10-21 DIAGNOSIS — M6281 Muscle weakness (generalized): Secondary | ICD-10-CM | POA: Diagnosis not present

## 2019-10-21 DIAGNOSIS — M542 Cervicalgia: Secondary | ICD-10-CM | POA: Diagnosis not present

## 2019-10-21 DIAGNOSIS — M545 Low back pain: Secondary | ICD-10-CM | POA: Diagnosis not present

## 2019-10-21 DIAGNOSIS — R519 Headache, unspecified: Secondary | ICD-10-CM | POA: Diagnosis not present

## 2019-10-23 DIAGNOSIS — S39012D Strain of muscle, fascia and tendon of lower back, subsequent encounter: Secondary | ICD-10-CM | POA: Diagnosis not present

## 2019-10-23 DIAGNOSIS — M6281 Muscle weakness (generalized): Secondary | ICD-10-CM | POA: Diagnosis not present

## 2019-10-23 DIAGNOSIS — M545 Low back pain: Secondary | ICD-10-CM | POA: Diagnosis not present

## 2019-10-23 DIAGNOSIS — R519 Headache, unspecified: Secondary | ICD-10-CM | POA: Diagnosis not present

## 2019-10-23 DIAGNOSIS — R262 Difficulty in walking, not elsewhere classified: Secondary | ICD-10-CM | POA: Diagnosis not present

## 2019-10-23 DIAGNOSIS — M2560 Stiffness of unspecified joint, not elsewhere classified: Secondary | ICD-10-CM | POA: Diagnosis not present

## 2019-10-23 DIAGNOSIS — M542 Cervicalgia: Secondary | ICD-10-CM | POA: Diagnosis not present

## 2019-10-30 DIAGNOSIS — M6281 Muscle weakness (generalized): Secondary | ICD-10-CM | POA: Diagnosis not present

## 2019-10-30 DIAGNOSIS — M2560 Stiffness of unspecified joint, not elsewhere classified: Secondary | ICD-10-CM | POA: Diagnosis not present

## 2019-10-30 DIAGNOSIS — M542 Cervicalgia: Secondary | ICD-10-CM | POA: Diagnosis not present

## 2019-10-30 DIAGNOSIS — S39012D Strain of muscle, fascia and tendon of lower back, subsequent encounter: Secondary | ICD-10-CM | POA: Diagnosis not present

## 2019-10-30 DIAGNOSIS — M545 Low back pain: Secondary | ICD-10-CM | POA: Diagnosis not present

## 2019-10-30 DIAGNOSIS — R262 Difficulty in walking, not elsewhere classified: Secondary | ICD-10-CM | POA: Diagnosis not present

## 2019-10-30 DIAGNOSIS — R519 Headache, unspecified: Secondary | ICD-10-CM | POA: Diagnosis not present

## 2019-11-04 DIAGNOSIS — S39012D Strain of muscle, fascia and tendon of lower back, subsequent encounter: Secondary | ICD-10-CM | POA: Diagnosis not present

## 2019-11-04 DIAGNOSIS — R262 Difficulty in walking, not elsewhere classified: Secondary | ICD-10-CM | POA: Diagnosis not present

## 2019-11-04 DIAGNOSIS — M2560 Stiffness of unspecified joint, not elsewhere classified: Secondary | ICD-10-CM | POA: Diagnosis not present

## 2019-11-04 DIAGNOSIS — M545 Low back pain: Secondary | ICD-10-CM | POA: Diagnosis not present

## 2019-11-04 DIAGNOSIS — R519 Headache, unspecified: Secondary | ICD-10-CM | POA: Diagnosis not present

## 2019-11-04 DIAGNOSIS — M6281 Muscle weakness (generalized): Secondary | ICD-10-CM | POA: Diagnosis not present

## 2019-11-04 DIAGNOSIS — M542 Cervicalgia: Secondary | ICD-10-CM | POA: Diagnosis not present

## 2019-11-06 DIAGNOSIS — M542 Cervicalgia: Secondary | ICD-10-CM | POA: Diagnosis not present

## 2019-11-06 DIAGNOSIS — M2560 Stiffness of unspecified joint, not elsewhere classified: Secondary | ICD-10-CM | POA: Diagnosis not present

## 2019-11-06 DIAGNOSIS — R519 Headache, unspecified: Secondary | ICD-10-CM | POA: Diagnosis not present

## 2019-11-06 DIAGNOSIS — M6281 Muscle weakness (generalized): Secondary | ICD-10-CM | POA: Diagnosis not present

## 2019-11-06 DIAGNOSIS — R262 Difficulty in walking, not elsewhere classified: Secondary | ICD-10-CM | POA: Diagnosis not present

## 2019-11-06 DIAGNOSIS — M545 Low back pain: Secondary | ICD-10-CM | POA: Diagnosis not present

## 2019-11-06 DIAGNOSIS — S39012D Strain of muscle, fascia and tendon of lower back, subsequent encounter: Secondary | ICD-10-CM | POA: Diagnosis not present

## 2019-11-12 ENCOUNTER — Ambulatory Visit (INDEPENDENT_AMBULATORY_CARE_PROVIDER_SITE_OTHER): Payer: Medicare HMO | Admitting: Psychiatry

## 2019-11-12 ENCOUNTER — Other Ambulatory Visit: Payer: Self-pay

## 2019-11-12 DIAGNOSIS — G8929 Other chronic pain: Secondary | ICD-10-CM | POA: Diagnosis not present

## 2019-11-12 DIAGNOSIS — F4010 Social phobia, unspecified: Secondary | ICD-10-CM

## 2019-11-12 DIAGNOSIS — R5382 Chronic fatigue, unspecified: Secondary | ICD-10-CM | POA: Diagnosis not present

## 2019-11-12 DIAGNOSIS — M549 Dorsalgia, unspecified: Secondary | ICD-10-CM

## 2019-11-12 DIAGNOSIS — G988 Other disorders of nervous system: Secondary | ICD-10-CM | POA: Diagnosis not present

## 2019-11-12 DIAGNOSIS — G9332 Myalgic encephalomyelitis/chronic fatigue syndrome: Secondary | ICD-10-CM

## 2019-11-12 DIAGNOSIS — F422 Mixed obsessional thoughts and acts: Secondary | ICD-10-CM

## 2019-11-12 DIAGNOSIS — Z636 Dependent relative needing care at home: Secondary | ICD-10-CM | POA: Diagnosis not present

## 2019-11-12 NOTE — Progress Notes (Signed)
Psychotherapy Progress Note [facility] [name], PhD LP  Patient ID: Adam Meyers     MRN: [id number] Therapy format: Individual psychotherapy Date: 11/12/2019      Start: 3:21p     Stop: 4:11p     Time Spent: 50 min Location: In-person   Session narrative (presenting needs, interim history, self-report of stressors and symptoms, applications of prior therapy, status changes, and interventions made in session) M fell in March, had UTI, now 24-hr care at home.  Had stroke as well, so now OT/PT coming into the home and awaiting speech tx, too.  Exhausting for PT to deal with the regular flow of people in the home, including daily caregivers, daily Meals on Wheels, and these.    Niece marrying late June, hopefully of getting a vacation trip afterward.    Still warming up to the idea of getting COVID vaccinated.  More anxiety about people and crowds than anything, though he has had worries about side effects.   Discussed and normalized concerns and informed about safety and effectiveness as communicated in public guidance and as shared in the health system.  Resolved most likely to go on and receive vaccination soon based on [place]'s endorsement.  Therapeutic modalities: Cognitive Behavioral Therapy, Solution-Oriented/Positive Psychology and Psycho-education/Bibliotherapy  Mental Status/Observations:  Appearance:   Casual and Neat     Behavior:  Appropriate  Motor:  Normal  Speech/Language:   Clear and Coherent  Affect:  Appropriate and Constricted  Mood:  anxious  Thought process:  normal  Thought content:    WNL and Obsessions  Sensory/Perceptual disturbances:    WNL  Orientation:  Fully oriented  Attention:  Good    Concentration:  Good  Memory:  WNL  Insight:    Fair  Judgment:   Good  Impulse Control:  Good   Risk Assessment: Danger to Self: No Self-injurious Behavior: No Danger to Others: No Physical Aggression / Violence: No Duty to Warn: No Access  to Firearms a concern: No  Assessment of progress:  stabilized  Diagnosis:   ICD-10-CM   1. Mixed obsessional thoughts and acts  F42.2   2. Chronic fatigue syndrome  R53.82   3. 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 Social anxiety disorder  F40.10   5. Caregiver stress  Z63.6   6. Chronic back pain, unspecified back location, unspecified back pain laterality  M54.9    G89.29    Plan:  . Continue to challenge obsessions and compulsions ad lib . Seek to tolerate extra personnel in the house as a challenge to OCD and social anxiety . Recommend to go ahead and receive COVID vaccination as safe and effective and peace of mind in the face of pandemic . Other recommendations/advice as may be noted above . Continue to utilize previously learned skills ad lib . Maintain medication as prescribed and work faithfully with relevant prescriber(s) if any changes are desired or seem indicated . Call the clinic on-call service, present to ER, or call 911 if any life-threatening psychiatric crisis Return in about 2 weeks (around 11/26/2019). . Already scheduled visit in this office 11/26/2019.  [name], PhD [name], PhD LP Clinical Psychologist, [facility] [address] [place], [place] [address] [phone]

## 2019-11-13 DIAGNOSIS — R519 Headache, unspecified: Secondary | ICD-10-CM | POA: Diagnosis not present

## 2019-11-13 DIAGNOSIS — S39012D Strain of muscle, fascia and tendon of lower back, subsequent encounter: Secondary | ICD-10-CM | POA: Diagnosis not present

## 2019-11-13 DIAGNOSIS — M542 Cervicalgia: Secondary | ICD-10-CM | POA: Diagnosis not present

## 2019-11-13 DIAGNOSIS — M2560 Stiffness of unspecified joint, not elsewhere classified: Secondary | ICD-10-CM | POA: Diagnosis not present

## 2019-11-13 DIAGNOSIS — M545 Low back pain: Secondary | ICD-10-CM | POA: Diagnosis not present

## 2019-11-13 DIAGNOSIS — R262 Difficulty in walking, not elsewhere classified: Secondary | ICD-10-CM | POA: Diagnosis not present

## 2019-11-13 DIAGNOSIS — M6281 Muscle weakness (generalized): Secondary | ICD-10-CM | POA: Diagnosis not present

## 2019-11-17 ENCOUNTER — Ambulatory Visit: Payer: Commercial Managed Care - HMO

## 2019-11-20 DIAGNOSIS — M542 Cervicalgia: Secondary | ICD-10-CM | POA: Diagnosis not present

## 2019-11-20 DIAGNOSIS — M545 Low back pain: Secondary | ICD-10-CM | POA: Diagnosis not present

## 2019-11-20 DIAGNOSIS — R262 Difficulty in walking, not elsewhere classified: Secondary | ICD-10-CM | POA: Diagnosis not present

## 2019-11-20 DIAGNOSIS — M2560 Stiffness of unspecified joint, not elsewhere classified: Secondary | ICD-10-CM | POA: Diagnosis not present

## 2019-11-20 DIAGNOSIS — M6281 Muscle weakness (generalized): Secondary | ICD-10-CM | POA: Diagnosis not present

## 2019-11-20 DIAGNOSIS — R519 Headache, unspecified: Secondary | ICD-10-CM | POA: Diagnosis not present

## 2019-11-20 DIAGNOSIS — S39012D Strain of muscle, fascia and tendon of lower back, subsequent encounter: Secondary | ICD-10-CM | POA: Diagnosis not present

## 2019-11-25 DIAGNOSIS — M545 Low back pain: Secondary | ICD-10-CM | POA: Diagnosis not present

## 2019-11-25 DIAGNOSIS — R262 Difficulty in walking, not elsewhere classified: Secondary | ICD-10-CM | POA: Diagnosis not present

## 2019-11-25 DIAGNOSIS — M2560 Stiffness of unspecified joint, not elsewhere classified: Secondary | ICD-10-CM | POA: Diagnosis not present

## 2019-11-25 DIAGNOSIS — R519 Headache, unspecified: Secondary | ICD-10-CM | POA: Diagnosis not present

## 2019-11-25 DIAGNOSIS — S39012D Strain of muscle, fascia and tendon of lower back, subsequent encounter: Secondary | ICD-10-CM | POA: Diagnosis not present

## 2019-11-25 DIAGNOSIS — M542 Cervicalgia: Secondary | ICD-10-CM | POA: Diagnosis not present

## 2019-11-25 DIAGNOSIS — M6281 Muscle weakness (generalized): Secondary | ICD-10-CM | POA: Diagnosis not present

## 2019-11-26 ENCOUNTER — Ambulatory Visit: Payer: Medicare HMO | Admitting: Psychiatry

## 2019-11-27 DIAGNOSIS — M6281 Muscle weakness (generalized): Secondary | ICD-10-CM | POA: Diagnosis not present

## 2019-11-27 DIAGNOSIS — S39012D Strain of muscle, fascia and tendon of lower back, subsequent encounter: Secondary | ICD-10-CM | POA: Diagnosis not present

## 2019-11-27 DIAGNOSIS — M542 Cervicalgia: Secondary | ICD-10-CM | POA: Diagnosis not present

## 2019-11-27 DIAGNOSIS — M545 Low back pain: Secondary | ICD-10-CM | POA: Diagnosis not present

## 2019-11-27 DIAGNOSIS — M2560 Stiffness of unspecified joint, not elsewhere classified: Secondary | ICD-10-CM | POA: Diagnosis not present

## 2019-11-27 DIAGNOSIS — R519 Headache, unspecified: Secondary | ICD-10-CM | POA: Diagnosis not present

## 2019-11-27 DIAGNOSIS — R262 Difficulty in walking, not elsewhere classified: Secondary | ICD-10-CM | POA: Diagnosis not present

## 2019-12-02 DIAGNOSIS — M545 Low back pain: Secondary | ICD-10-CM | POA: Diagnosis not present

## 2019-12-02 DIAGNOSIS — M542 Cervicalgia: Secondary | ICD-10-CM | POA: Diagnosis not present

## 2019-12-02 DIAGNOSIS — R519 Headache, unspecified: Secondary | ICD-10-CM | POA: Diagnosis not present

## 2019-12-02 DIAGNOSIS — M2560 Stiffness of unspecified joint, not elsewhere classified: Secondary | ICD-10-CM | POA: Diagnosis not present

## 2019-12-02 DIAGNOSIS — M6281 Muscle weakness (generalized): Secondary | ICD-10-CM | POA: Diagnosis not present

## 2019-12-02 DIAGNOSIS — S39012D Strain of muscle, fascia and tendon of lower back, subsequent encounter: Secondary | ICD-10-CM | POA: Diagnosis not present

## 2019-12-02 DIAGNOSIS — R262 Difficulty in walking, not elsewhere classified: Secondary | ICD-10-CM | POA: Diagnosis not present

## 2019-12-04 ENCOUNTER — Ambulatory Visit: Payer: Medicare HMO | Attending: Internal Medicine

## 2019-12-04 DIAGNOSIS — Z23 Encounter for immunization: Secondary | ICD-10-CM

## 2019-12-04 NOTE — Progress Notes (Signed)
   Covid-19 Vaccination Clinic  Name:  Adam Meyers    MRN: [id number] DOB: [birth year]  12/04/2019  [name] was observed post Covid-19 immunization for 15 minutes without incident. He was provided with Vaccine Information Sheet and instruction to access the V-Safe system.   [name] was instructed to call 911 with any severe reactions post vaccine: Marland Kitchen Difficulty breathing  . Swelling of face and throat  . A fast heartbeat  . A bad rash all over body  . Dizziness and weakness   Immunizations Administered    Name Date Dose VIS Date Route   Pfizer COVID-19 Vaccine 12/04/2019  3:33 PM 0.3 mL 08/20/2018 Intramuscular   Manufacturer: [employer], [employer]   Lot: [id number]   NDC: [address]-7

## 2019-12-09 DIAGNOSIS — S39012D Strain of muscle, fascia and tendon of lower back, subsequent encounter: Secondary | ICD-10-CM | POA: Diagnosis not present

## 2019-12-09 DIAGNOSIS — R262 Difficulty in walking, not elsewhere classified: Secondary | ICD-10-CM | POA: Diagnosis not present

## 2019-12-09 DIAGNOSIS — M545 Low back pain: Secondary | ICD-10-CM | POA: Diagnosis not present

## 2019-12-09 DIAGNOSIS — M2560 Stiffness of unspecified joint, not elsewhere classified: Secondary | ICD-10-CM | POA: Diagnosis not present

## 2019-12-09 DIAGNOSIS — M6281 Muscle weakness (generalized): Secondary | ICD-10-CM | POA: Diagnosis not present

## 2019-12-09 DIAGNOSIS — M542 Cervicalgia: Secondary | ICD-10-CM | POA: Diagnosis not present

## 2019-12-09 DIAGNOSIS — R519 Headache, unspecified: Secondary | ICD-10-CM | POA: Diagnosis not present

## 2019-12-11 DIAGNOSIS — M545 Low back pain: Secondary | ICD-10-CM | POA: Diagnosis not present

## 2019-12-11 DIAGNOSIS — R262 Difficulty in walking, not elsewhere classified: Secondary | ICD-10-CM | POA: Diagnosis not present

## 2019-12-11 DIAGNOSIS — R519 Headache, unspecified: Secondary | ICD-10-CM | POA: Diagnosis not present

## 2019-12-11 DIAGNOSIS — S39012D Strain of muscle, fascia and tendon of lower back, subsequent encounter: Secondary | ICD-10-CM | POA: Diagnosis not present

## 2019-12-11 DIAGNOSIS — M2560 Stiffness of unspecified joint, not elsewhere classified: Secondary | ICD-10-CM | POA: Diagnosis not present

## 2019-12-11 DIAGNOSIS — M542 Cervicalgia: Secondary | ICD-10-CM | POA: Diagnosis not present

## 2019-12-11 DIAGNOSIS — M6281 Muscle weakness (generalized): Secondary | ICD-10-CM | POA: Diagnosis not present

## 2019-12-16 ENCOUNTER — Ambulatory Visit (INDEPENDENT_AMBULATORY_CARE_PROVIDER_SITE_OTHER): Payer: Medicare HMO | Admitting: Psychiatry

## 2019-12-16 ENCOUNTER — Other Ambulatory Visit: Payer: Self-pay

## 2019-12-16 DIAGNOSIS — F331 Major depressive disorder, recurrent, moderate: Secondary | ICD-10-CM

## 2019-12-16 DIAGNOSIS — F4010 Social phobia, unspecified: Secondary | ICD-10-CM | POA: Diagnosis not present

## 2019-12-16 DIAGNOSIS — R5382 Chronic fatigue, unspecified: Secondary | ICD-10-CM | POA: Diagnosis not present

## 2019-12-16 DIAGNOSIS — F422 Mixed obsessional thoughts and acts: Secondary | ICD-10-CM | POA: Diagnosis not present

## 2019-12-16 DIAGNOSIS — G988 Other disorders of nervous system: Secondary | ICD-10-CM | POA: Diagnosis not present

## 2019-12-16 DIAGNOSIS — G9332 Myalgic encephalomyelitis/chronic fatigue syndrome: Secondary | ICD-10-CM

## 2019-12-16 NOTE — Progress Notes (Signed)
Psychotherapy Progress Note [facility] Adam Moore, PhD LP  Patient ID: Adam Meyers     MRN: [id number] Therapy format: Individual psychotherapy- Date: 12/16/2019      Start: 3:12p     Stop: 4:02p     Time Spent: 50 min Location: In-person   Session narrative (presenting needs, interim history, self-report of stressors and symptoms, applications of prior therapy, status changes, and interventions made in session) Got 1st COVID vaccine after cancelling 1st appt.  Turned out to be a dry run dealing with anxiety.  2nd shot next week.  Adam Meyers good moral support.  Apprehension of the shot "pushed every button I had", felt enervated for a couple days afterward.  Had low-grade fever, unwell feeling, sore arm, headache (which lasted 2nd day).  Affected by somatic sxs (shallow breath, some elevated HR, nausea) further which seem to have all been anxiety.  Restricted diet in response to nausea (chicken, Coke, crackers).  Serendipitously called ex-wife, who benevolently led him through a breathing exercise.  Has come in handy at other times.  Some strong anxiety with temporary personnel in mother's care, in the house.  Variety in diet returned, though it is typically fairly narrow range.  Wedding this weekend, facing 2-hr drive, social exposure for 3-4 hrs, and drive back.  Fears getting too fatigued, but he is the only driver.  Adam Meyers would, but he let his license lapse and has to wait for a July appointment with [name].  Discussed tactics for staying awake while driving (e.g., air/AC, cold drink, hold ice in mouth, stop and walk around, overbreathe a few moments to hyperoxygenate).    Reviewed possibility that his fatigue has a metabolic and inflammatory component, re-oriented to carb control strategies including emphasizing fat over carbs in diet, handy ways to reduce carbs, and the possibility of MCT oil as a way to overcome draggy feelings and test whether he has a metabolic component to  his fatigue..    Therapeutic modalities: Cognitive Behavioral Therapy, Solution-Oriented/Positive Psychology and Psycho-education/Bibliotherapy  Mental Status/Observations:  Appearance:   Casual     Behavior:  Appropriate  Motor:  Normal  Speech/Language:   Clear and Coherent  Affect:  Appropriate  Mood:  anxious  Thought process:  normal  Thought content:    WNL  Sensory/Perceptual disturbances:    WNL  Orientation:  Fully oriented  Attention:  Good    Concentration:  Good  Memory:  WNL  Insight:    Fair  Judgment:   Good  Impulse Control:  Good   Risk Assessment: Danger to Self: No Self-injurious Behavior: No Danger to Others: No Physical Aggression / Violence: No Duty to Warn: No Access to Firearms a concern: No  Assessment of progress:  stabilized  Diagnosis:   ICD-10-CM   1. Mixed obsessional thoughts and acts  F42.2   2. Chronic fatigue syndrome  N47.09    r/o metabolic/dietary component  3. 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 Social anxiety disorder  F40.10   5. Major depressive disorder, recurrent episode, moderate (HCC)  F33.1    Plan:  . Consider dietary changes and experimental use of carb control and MCT oil . Continue accepting in-home personnel and trips out as continuing exposure exercise for social anxiety, OC fears,  . Make regular use of breathing techniques for calming and energy, as needed . Other recommendations/advice as may be noted above . Continue to utilize previously learned skills ad lib . Maintain medication as prescribed and work  faithfully with relevant prescriber(s) if any changes are desired or seem indicated . Call the clinic on-call service, present to ER, or call 911 if any life-threatening psychiatric crisis Return in about 3 weeks (around 01/06/2020). . Already scheduled visit in this office 01/12/2020.  Adam Fries, PhD [name], PhD LP Clinical Psychologist, [facility] [address], [address] [place], [place] [address] [phone]

## 2019-12-17 ENCOUNTER — Ambulatory Visit: Payer: Medicare HMO | Admitting: Psychiatry

## 2019-12-23 DIAGNOSIS — M542 Cervicalgia: Secondary | ICD-10-CM | POA: Diagnosis not present

## 2019-12-23 DIAGNOSIS — R262 Difficulty in walking, not elsewhere classified: Secondary | ICD-10-CM | POA: Diagnosis not present

## 2019-12-23 DIAGNOSIS — R519 Headache, unspecified: Secondary | ICD-10-CM | POA: Diagnosis not present

## 2019-12-23 DIAGNOSIS — M2560 Stiffness of unspecified joint, not elsewhere classified: Secondary | ICD-10-CM | POA: Diagnosis not present

## 2019-12-23 DIAGNOSIS — S39012D Strain of muscle, fascia and tendon of lower back, subsequent encounter: Secondary | ICD-10-CM | POA: Diagnosis not present

## 2019-12-23 DIAGNOSIS — M545 Low back pain: Secondary | ICD-10-CM | POA: Diagnosis not present

## 2019-12-23 DIAGNOSIS — M6281 Muscle weakness (generalized): Secondary | ICD-10-CM | POA: Diagnosis not present

## 2019-12-25 ENCOUNTER — Ambulatory Visit: Payer: Medicare HMO

## 2019-12-30 DIAGNOSIS — Z20822 Contact with and (suspected) exposure to covid-19: Secondary | ICD-10-CM | POA: Diagnosis not present

## 2019-12-30 DIAGNOSIS — Z03818 Encounter for observation for suspected exposure to other biological agents ruled out: Secondary | ICD-10-CM | POA: Diagnosis not present

## 2020-01-01 ENCOUNTER — Ambulatory Visit: Payer: Medicare HMO | Attending: Internal Medicine

## 2020-01-01 DIAGNOSIS — Z23 Encounter for immunization: Secondary | ICD-10-CM

## 2020-01-01 NOTE — Progress Notes (Signed)
° °  Covid-19 Vaccination Clinic  Name:  Adam Meyers    MRN: [id number] DOB: 09/26/1960  01/01/2020  [name] was observed post Covid-19 immunization for 15 minutes without incident. He was provided with Vaccine Information Sheet and instruction to access the V-Safe system.   [name] was instructed to call 911 with any severe reactions post vaccine:  Difficulty breathing   Swelling of face and throat   A fast heartbeat   A bad rash all over body   Dizziness and weakness   Immunizations Administered    Name Date Dose VIS Date Route   Pfizer COVID-19 Vaccine 01/01/2020  4:04 PM 0.3 mL 08/20/2018 Intramuscular   Manufacturer: [employer], [employer]   Lot: [id number]   NDC: [address]-6

## 2020-01-06 DIAGNOSIS — M2560 Stiffness of unspecified joint, not elsewhere classified: Secondary | ICD-10-CM | POA: Diagnosis not present

## 2020-01-06 DIAGNOSIS — M545 Low back pain: Secondary | ICD-10-CM | POA: Diagnosis not present

## 2020-01-06 DIAGNOSIS — M6281 Muscle weakness (generalized): Secondary | ICD-10-CM | POA: Diagnosis not present

## 2020-01-06 DIAGNOSIS — S39012D Strain of muscle, fascia and tendon of lower back, subsequent encounter: Secondary | ICD-10-CM | POA: Diagnosis not present

## 2020-01-06 DIAGNOSIS — R519 Headache, unspecified: Secondary | ICD-10-CM | POA: Diagnosis not present

## 2020-01-06 DIAGNOSIS — M542 Cervicalgia: Secondary | ICD-10-CM | POA: Diagnosis not present

## 2020-01-06 DIAGNOSIS — R262 Difficulty in walking, not elsewhere classified: Secondary | ICD-10-CM | POA: Diagnosis not present

## 2020-01-08 DIAGNOSIS — S39012D Strain of muscle, fascia and tendon of lower back, subsequent encounter: Secondary | ICD-10-CM | POA: Diagnosis not present

## 2020-01-08 DIAGNOSIS — M6281 Muscle weakness (generalized): Secondary | ICD-10-CM | POA: Diagnosis not present

## 2020-01-08 DIAGNOSIS — M2560 Stiffness of unspecified joint, not elsewhere classified: Secondary | ICD-10-CM | POA: Diagnosis not present

## 2020-01-08 DIAGNOSIS — R262 Difficulty in walking, not elsewhere classified: Secondary | ICD-10-CM | POA: Diagnosis not present

## 2020-01-08 DIAGNOSIS — M545 Low back pain: Secondary | ICD-10-CM | POA: Diagnosis not present

## 2020-01-08 DIAGNOSIS — R519 Headache, unspecified: Secondary | ICD-10-CM | POA: Diagnosis not present

## 2020-01-08 DIAGNOSIS — M542 Cervicalgia: Secondary | ICD-10-CM | POA: Diagnosis not present

## 2020-01-12 ENCOUNTER — Ambulatory Visit (INDEPENDENT_AMBULATORY_CARE_PROVIDER_SITE_OTHER): Payer: Medicare HMO | Admitting: Psychiatry

## 2020-01-12 ENCOUNTER — Other Ambulatory Visit: Payer: Self-pay

## 2020-01-12 DIAGNOSIS — M549 Dorsalgia, unspecified: Secondary | ICD-10-CM | POA: Diagnosis not present

## 2020-01-12 DIAGNOSIS — G8929 Other chronic pain: Secondary | ICD-10-CM

## 2020-01-12 DIAGNOSIS — R5382 Chronic fatigue, unspecified: Secondary | ICD-10-CM

## 2020-01-12 DIAGNOSIS — F4010 Social phobia, unspecified: Secondary | ICD-10-CM

## 2020-01-12 DIAGNOSIS — Z636 Dependent relative needing care at home: Secondary | ICD-10-CM

## 2020-01-12 DIAGNOSIS — G988 Other disorders of nervous system: Secondary | ICD-10-CM

## 2020-01-12 DIAGNOSIS — F422 Mixed obsessional thoughts and acts: Secondary | ICD-10-CM

## 2020-01-12 DIAGNOSIS — G9332 Myalgic encephalomyelitis/chronic fatigue syndrome: Secondary | ICD-10-CM
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
## 2020-01-13 DIAGNOSIS — M542 Cervicalgia: Secondary | ICD-10-CM | POA: Diagnosis not present

## 2020-01-13 DIAGNOSIS — R519 Headache, unspecified: Secondary | ICD-10-CM | POA: Diagnosis not present

## 2020-01-13 DIAGNOSIS — S39012D Strain of muscle, fascia and tendon of lower back, subsequent encounter: Secondary | ICD-10-CM | POA: Diagnosis not present

## 2020-01-13 DIAGNOSIS — M6281 Muscle weakness (generalized): Secondary | ICD-10-CM | POA: Diagnosis not present

## 2020-01-13 DIAGNOSIS — R262 Difficulty in walking, not elsewhere classified: Secondary | ICD-10-CM | POA: Diagnosis not present

## 2020-01-13 DIAGNOSIS — M2560 Stiffness of unspecified joint, not elsewhere classified: Secondary | ICD-10-CM | POA: Diagnosis not present

## 2020-01-13 DIAGNOSIS — M545 Low back pain: Secondary | ICD-10-CM | POA: Diagnosis not present

## 2020-01-15 DIAGNOSIS — M6281 Muscle weakness (generalized): Secondary | ICD-10-CM | POA: Diagnosis not present

## 2020-01-15 DIAGNOSIS — R519 Headache, unspecified: Secondary | ICD-10-CM | POA: Diagnosis not present

## 2020-01-15 DIAGNOSIS — M545 Low back pain: Secondary | ICD-10-CM | POA: Diagnosis not present

## 2020-01-15 DIAGNOSIS — M2560 Stiffness of unspecified joint, not elsewhere classified: Secondary | ICD-10-CM | POA: Diagnosis not present

## 2020-01-15 DIAGNOSIS — M542 Cervicalgia: Secondary | ICD-10-CM | POA: Diagnosis not present

## 2020-01-15 DIAGNOSIS — S39012D Strain of muscle, fascia and tendon of lower back, subsequent encounter: Secondary | ICD-10-CM | POA: Diagnosis not present

## 2020-01-15 DIAGNOSIS — R262 Difficulty in walking, not elsewhere classified: Secondary | ICD-10-CM | POA: Diagnosis not present

## 2020-01-20 DIAGNOSIS — R519 Headache, unspecified: Secondary | ICD-10-CM | POA: Diagnosis not present

## 2020-01-20 DIAGNOSIS — S39012D Strain of muscle, fascia and tendon of lower back, subsequent encounter: Secondary | ICD-10-CM | POA: Diagnosis not present

## 2020-01-20 DIAGNOSIS — M6281 Muscle weakness (generalized): Secondary | ICD-10-CM | POA: Diagnosis not present

## 2020-01-20 DIAGNOSIS — M542 Cervicalgia: Secondary | ICD-10-CM | POA: Diagnosis not present

## 2020-01-20 DIAGNOSIS — M545 Low back pain: Secondary | ICD-10-CM | POA: Diagnosis not present

## 2020-01-20 DIAGNOSIS — R262 Difficulty in walking, not elsewhere classified: Secondary | ICD-10-CM | POA: Diagnosis not present

## 2020-01-20 DIAGNOSIS — M2560 Stiffness of unspecified joint, not elsewhere classified: Secondary | ICD-10-CM | POA: Diagnosis not present

## 2020-01-22 DIAGNOSIS — M545 Low back pain: Secondary | ICD-10-CM | POA: Diagnosis not present

## 2020-01-22 DIAGNOSIS — M6281 Muscle weakness (generalized): Secondary | ICD-10-CM | POA: Diagnosis not present

## 2020-01-22 DIAGNOSIS — M542 Cervicalgia: Secondary | ICD-10-CM | POA: Diagnosis not present

## 2020-01-22 DIAGNOSIS — R519 Headache, unspecified: Secondary | ICD-10-CM | POA: Diagnosis not present

## 2020-01-22 DIAGNOSIS — S39012D Strain of muscle, fascia and tendon of lower back, subsequent encounter: Secondary | ICD-10-CM | POA: Diagnosis not present

## 2020-01-22 DIAGNOSIS — R262 Difficulty in walking, not elsewhere classified: Secondary | ICD-10-CM | POA: Diagnosis not present

## 2020-01-22 DIAGNOSIS — M2560 Stiffness of unspecified joint, not elsewhere classified: Secondary | ICD-10-CM | POA: Diagnosis not present

## 2020-02-05 ENCOUNTER — Ambulatory Visit (INDEPENDENT_AMBULATORY_CARE_PROVIDER_SITE_OTHER): Payer: Medicare HMO | Admitting: Psychiatry

## 2020-02-05 DIAGNOSIS — M549 Dorsalgia, unspecified: Secondary | ICD-10-CM | POA: Diagnosis not present

## 2020-02-05 DIAGNOSIS — F422 Mixed obsessional thoughts and acts: Secondary | ICD-10-CM | POA: Diagnosis not present

## 2020-02-05 DIAGNOSIS — Z636 Dependent relative needing care at home: Secondary | ICD-10-CM

## 2020-02-05 DIAGNOSIS — R5382 Chronic fatigue, unspecified: Secondary | ICD-10-CM

## 2020-02-05 DIAGNOSIS — G8929 Other chronic pain: Secondary | ICD-10-CM | POA: Diagnosis not present

## 2020-02-05 DIAGNOSIS — F4010 Social phobia, unspecified: Secondary | ICD-10-CM | POA: Diagnosis not present

## 2020-02-05 DIAGNOSIS — G9332 Myalgic encephalomyelitis/chronic fatigue syndrome: Secondary | ICD-10-CM
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
## 2020-02-10 DIAGNOSIS — S39012D Strain of muscle, fascia and tendon of lower back, subsequent encounter: Secondary | ICD-10-CM | POA: Diagnosis not present

## 2020-02-10 DIAGNOSIS — M545 Low back pain: Secondary | ICD-10-CM | POA: Diagnosis not present

## 2020-02-10 DIAGNOSIS — M2560 Stiffness of unspecified joint, not elsewhere classified: Secondary | ICD-10-CM | POA: Diagnosis not present

## 2020-02-10 DIAGNOSIS — R519 Headache, unspecified: Secondary | ICD-10-CM | POA: Diagnosis not present

## 2020-02-10 DIAGNOSIS — M542 Cervicalgia: Secondary | ICD-10-CM | POA: Diagnosis not present

## 2020-02-10 DIAGNOSIS — M6281 Muscle weakness (generalized): Secondary | ICD-10-CM | POA: Diagnosis not present

## 2020-02-10 DIAGNOSIS — R262 Difficulty in walking, not elsewhere classified: Secondary | ICD-10-CM | POA: Diagnosis not present

## 2020-02-11 ENCOUNTER — Ambulatory Visit (INDEPENDENT_AMBULATORY_CARE_PROVIDER_SITE_OTHER): Payer: Medicare HMO | Admitting: Psychiatry

## 2020-02-11 ENCOUNTER — Encounter: Payer: Self-pay | Admitting: Psychiatry

## 2020-02-11 ENCOUNTER — Other Ambulatory Visit: Payer: Self-pay

## 2020-02-11 DIAGNOSIS — Z636 Dependent relative needing care at home: Secondary | ICD-10-CM

## 2020-02-11 DIAGNOSIS — R5382 Chronic fatigue, unspecified: Secondary | ICD-10-CM | POA: Diagnosis not present

## 2020-02-11 DIAGNOSIS — G988 Other disorders of nervous system: Secondary | ICD-10-CM

## 2020-02-11 DIAGNOSIS — F3341 Major depressive disorder, recurrent, in partial remission: Secondary | ICD-10-CM

## 2020-02-11 DIAGNOSIS — M26609 Unspecified temporomandibular joint disorder, unspecified side: Secondary | ICD-10-CM

## 2020-02-11 DIAGNOSIS — G9332 Myalgic encephalomyelitis/chronic fatigue syndrome: Secondary | ICD-10-CM

## 2020-02-11 DIAGNOSIS — F331 Major depressive disorder, recurrent, moderate: Secondary | ICD-10-CM | POA: Diagnosis not present

## 2020-02-11 DIAGNOSIS — F4010 Social phobia, unspecified: Secondary | ICD-10-CM

## 2020-02-11 DIAGNOSIS — F422 Mixed obsessional thoughts and acts: Secondary | ICD-10-CM

## 2020-02-11 MED ORDER — DIAZEPAM 5 MG PO TABS: ORAL_TABLET | ORAL | 2 refills | Status: DC

## 2020-02-11 MED ORDER — FLUVOXAMINE MALEATE 100 MG PO TABS: ORAL_TABLET | ORAL | 1 refills | Status: DC

## 2020-02-11 NOTE — Progress Notes (Signed)
Adam Meyers [id number] [birth year] 59 y.o.   Subjective:   Patient ID:  Adam Meyers is a 59 y.o. (DOB 1960-12-15) male.  Chief Complaint:  Chief Complaint  Patient presents with  . Follow-up    OCD  . Anxiety    Depression        Associated symptoms include fatigue and headaches.  Adam Meyers presents for follow-up of OCD and anxiety.  seen November 2020.  No meds were changed at the time.  He was taking Luvox 150 mg a day and diazepam 5 mg at night for sleep  08/13/19 appt with the following noted: Worrying over pending loss of power bc 37 yo mother with him.  Her dementia is progressing.  Has daily help now. Stress living with mother.  Hosp for a month 2020 with cardiac and stroke problems.  Minor stroke.  Caretaking mother  Better TMJ with diazepam at HS.  Fear of the dentist now.  Fear of other doctors also. Was taking diazepam 5 hs for sleep.  Then did ok without it and stopped it. Half the time forgets the am dose of fluvoxamine.  Asks what that might do.  Not sure if anxiety or OCD is worse.  Sometimes lethargic.  Able to get out of the house some now with help.  Some germ issues at the grocery store periodically if everything doesn't go just right. Plan: be CW med.  No changes  02/11/20 appt with the following noted: Only taking fluvoxamine 100 mg daily. Vaccinated July 8, but "pushed every button that I got." for OCD.  Hard to make decisions and second guessing himself.   Son [name] off to [place] and pt got stomach bug a couple of days later and now fatigued.  That also triggered anxiety.  Being in a hotel room triggered him also.    Patient reports stable mood and denies depressed or irritable moods.  Patient denies difficulty with sleep initiation or maintenance. 10-11 hours typical lately.  Likes to sleep a lot and don't like to talk a lot.  Meyers energy. Brain won't work with less. Strange dreams.  Denies appetite disturbance.  Patient reports that energy and  motivation have been good.  Patient denies any difficulty with concentration.  Patient denies any suicidal ideation.  Sensitive to loud noises.  Never bothered me before my meltdown.  Past Psychiatric Medication Trials: Paxil Sex SE  Afraid of Zoloft bc mother hallucinated on it. Luvox 200 cog SE  pindolol, propranolol, diazepam   Review of Systems:  Review of Systems  Constitutional: Positive for fatigue.  HENT: Negative for postnasal drip, rhinorrhea and sneezing.        Jaw pain  Musculoskeletal: Positive for arthralgias, gait problem and neck pain.  Neurological: Positive for headaches. Negative for tremors and weakness.  Psychiatric/Behavioral: Positive for depression.    Medications: I have reviewed the patient's current medications.  Current Outpatient Medications  Medication Sig Dispense Refill  . Cholecalciferol (VITAMIN D) 50 MCG (2000 UT) CAPS Take by mouth.    . diazepam (VALIUM) 5 MG tablet TAKE 1 TABLET BY MOUTH THREE TIMES A DAY AS NEEDED FOR ANXIETY 90 tablet 2  . famotidine (PEPCID) 20 MG tablet Take 20 mg by mouth daily as needed for heartburn or indigestion.    . fluvoxaMINE (LUVOX) 100 MG tablet Take 1/2 tablet (50 mg) by mouth every morning and 1 tablet (100 mg) at bedtime 135 tablet 1  . ibuprofen (ADVIL) 200 MG tablet Take  600 mg by mouth every 8 (eight) hours as needed for moderate pain.      No current facility-administered medications for this visit.    Medication Side Effects: None  Allergies:  Allergies  Allergen Reactions  . Oxycodone-Acetaminophen Itching  . Seasonal Ic [Cholestatin]     Drainage,itchy eyes  . Gabapentin Anxiety  . Percocet [Oxycodone-Acetaminophen] Itching and Anxiety    Past Medical History:  Diagnosis Date  . Compressed cervical disc   . Cough   . Depression   . Hyperlipidemia   . Nasal congestion   . Social anxiety disorder 04/10/2018    Family History  Problem Relation Age of Onset  . Cancer Maternal  Grandmother        melanoma    Social History   Socioeconomic History  . Marital status: Married    Spouse name: Not on file  . Number of children: Not on file  . Years of education: Not on file  . Highest education level: Not on file  Occupational History  . Not on file  Tobacco Use  . Smoking status: Never Smoker  . Smokeless tobacco: Never Used  Substance and Sexual Activity  . Alcohol use: No  . Drug use: No  . Sexual activity: Not on file  Other Topics Concern  . Not on file  Social History Narrative  . Not on file   Social Determinants of Health   Financial Resource Strain:   . Difficulty of Paying Living Expenses:   Food Insecurity:   . Worried About Programme researcher, broadcasting/film/video in the Last Year:   . Barista in the Last Year:   Transportation Needs:   . Freight forwarder (Medical):   Marland Kitchen Lack of Transportation (Non-Medical):   Physical Activity:   . Days of Exercise per Week:   . Minutes of Exercise per Session:   Stress:   . Feeling of Stress :   Social Connections:   . Frequency of Communication with Friends and Family:   . Frequency of Social Gatherings with Friends and Family:   . Attends Religious Services:   . Active Member of Clubs or Organizations:   . Attends Banker Meetings:   Marland Kitchen Marital Status:   Intimate Partner Violence:   . Fear of Current or Ex-Partner:   . Emotionally Abused:   Marland Kitchen Physically Abused:   . Sexually Abused:     Past Medical History, Surgical history, Social history, and Family history were reviewed and updated as appropriate.   Please see review of systems for further details on the patient's review from today.   Objective:   Physical Exam:  There were no vitals taken for this visit.  Physical Exam Constitutional:      General: He is not in acute distress. Musculoskeletal:        General: No deformity.  Neurological:     Mental Status: He is alert and oriented to person, place, and time.      Cranial Nerves: No dysarthria.     Coordination: Coordination normal.  Psychiatric:        Attention and Perception: Attention and perception normal. He does not perceive auditory or visual hallucinations.        Mood and Affect: Mood is anxious. Mood is not depressed. Affect is not labile, blunt, angry or inappropriate.        Speech: Speech normal.        Behavior: Behavior normal. Behavior is cooperative.  Thought Content: Thought content normal. Thought content is not paranoid or delusional. Thought content does not include homicidal or suicidal ideation. Thought content does not include homicidal or suicidal plan.        Cognition and Memory: Cognition and memory normal.        Judgment: Judgment normal.     Comments: Residual chronic OCD exacerbated by Covid vaccine.  It still interferes with function and QOL markedly. Chronically fearful of med change. Fair insight and judgment Talkative without pressure     Lab Review:  No results found for: NA, K, CL, CO2, GLUCOSE, BUN, CREATININE, CALCIUM, PROT, ALBUMIN, AST, ALT, ALKPHOS, BILITOT, GFRNONAA, GFRAA  No results found for: WBC, RBC, HGB, HCT, PLT, MCV, MCH, MCHC, RDW, LYMPHSABS, MONOABS, EOSABS, BASOSABS  No results found for: POCLITH, LITHIUM   No results found for: PHENYTOIN, PHENOBARB, VALPROATE, CBMZ   .res Assessment: Plan:    Mixed obsessional thoughts and acts - Plan: diazepam (VALIUM) 5 MG tablet, fluvoxaMINE (LUVOX) 100 MG tablet  Social anxiety disorder - Plan: diazepam (VALIUM) 5 MG tablet, fluvoxaMINE (LUVOX) 100 MG tablet  Major depressive disorder, recurrent episode, moderate (HCC)  Chronic fatigue syndrome  Sensory hypersensitivity  TMJ disease - Plan: diazepam (VALIUM) 5 MG tablet  Caregiver stress - Plan: diazepam (VALIUM) 5 MG tablet  Major depressive disorder, recurrent episode, in partial remission (HCC) - Plan: fluvoxaMINE (LUVOX) 100 MG tablet   Greater than 50% 30 min of face to face  time with patient was spent on counseling and coordination of care. We discussed [name] has a long history of OCD and a history of depression as well and first came to our practice and April 2014.  He continues in therapy with Dr. Amado Coe.  While he has made a lot of progress and is much less distressed by his OCD.  He remains disabled because of time requirements of the OCD and he is easily triggered into compulsive rituals with high levels of anxiety.  With his limited life demands and relative isolation he manages acceptably well.  He does not want to take take more SSRI medication because of sexual side effects are worse at higher dosages.  Exacerbation of OCD triggered by recent Covid vaccine and fears about complicaations.   Continue meds.  Benefit from meds. Rec Cont it longterm bc high relapse risk.  Emphasized need for consistency.   Answered his questions about need for it longterm medication given severity of his sx historically.   Increase diazepam 5 mg HS has helped TMJ.     Rec increase fluvoxamine but he complains grogginess at higher dosage. He agrees. Be consistent with 150 mg daily. Option change SSRI to Prozac to reduce fatigue.  He's afraid of Zoloft bc mother had a bad time with it.  He's afraid of switching meds.  Disc SE.  FU 3 mos   Meredith Staggers, MD, DFAPA   Please see After Visit Summary for patient specific instructions.  Future Appointments  Date Time Provider Department Center  02/25/2020  3:00 PM [name], PhD [facility] None  03/17/2020  3:00 PM Mitchum, Molly Maduro, PhD [facility] None    No orders of the defined types were placed in this encounter.     -------------------------------

## 2020-02-12 DIAGNOSIS — M6281 Muscle weakness (generalized): Secondary | ICD-10-CM | POA: Diagnosis not present

## 2020-02-12 DIAGNOSIS — S39012D Strain of muscle, fascia and tendon of lower back, subsequent encounter: Secondary | ICD-10-CM | POA: Diagnosis not present

## 2020-02-12 DIAGNOSIS — R519 Headache, unspecified: Secondary | ICD-10-CM | POA: Diagnosis not present

## 2020-02-12 DIAGNOSIS — R262 Difficulty in walking, not elsewhere classified: Secondary | ICD-10-CM | POA: Diagnosis not present

## 2020-02-12 DIAGNOSIS — M542 Cervicalgia: Secondary | ICD-10-CM | POA: Diagnosis not present

## 2020-02-12 DIAGNOSIS — M2560 Stiffness of unspecified joint, not elsewhere classified: Secondary | ICD-10-CM | POA: Diagnosis not present

## 2020-02-12 DIAGNOSIS — M545 Low back pain: Secondary | ICD-10-CM | POA: Diagnosis not present

## 2020-02-17 DIAGNOSIS — R262 Difficulty in walking, not elsewhere classified: Secondary | ICD-10-CM | POA: Diagnosis not present

## 2020-02-17 DIAGNOSIS — S39012D Strain of muscle, fascia and tendon of lower back, subsequent encounter: Secondary | ICD-10-CM | POA: Diagnosis not present

## 2020-02-17 DIAGNOSIS — M542 Cervicalgia: Secondary | ICD-10-CM | POA: Diagnosis not present

## 2020-02-17 DIAGNOSIS — R519 Headache, unspecified: Secondary | ICD-10-CM | POA: Diagnosis not present

## 2020-02-17 DIAGNOSIS — M545 Low back pain: Secondary | ICD-10-CM | POA: Diagnosis not present

## 2020-02-17 DIAGNOSIS — M6281 Muscle weakness (generalized): Secondary | ICD-10-CM | POA: Diagnosis not present

## 2020-02-17 DIAGNOSIS — M2560 Stiffness of unspecified joint, not elsewhere classified: Secondary | ICD-10-CM | POA: Diagnosis not present

## 2020-02-24 DIAGNOSIS — R262 Difficulty in walking, not elsewhere classified: Secondary | ICD-10-CM | POA: Diagnosis not present

## 2020-02-24 DIAGNOSIS — M6281 Muscle weakness (generalized): Secondary | ICD-10-CM | POA: Diagnosis not present

## 2020-02-24 DIAGNOSIS — R519 Headache, unspecified: Secondary | ICD-10-CM | POA: Diagnosis not present

## 2020-02-24 DIAGNOSIS — S39012D Strain of muscle, fascia and tendon of lower back, subsequent encounter: Secondary | ICD-10-CM | POA: Diagnosis not present

## 2020-02-24 DIAGNOSIS — M2560 Stiffness of unspecified joint, not elsewhere classified: Secondary | ICD-10-CM | POA: Diagnosis not present

## 2020-02-24 DIAGNOSIS — M545 Low back pain: Secondary | ICD-10-CM | POA: Diagnosis not present

## 2020-02-24 DIAGNOSIS — M542 Cervicalgia: Secondary | ICD-10-CM | POA: Diagnosis not present

## 2020-02-25 ENCOUNTER — Other Ambulatory Visit: Payer: Self-pay

## 2020-02-25 ENCOUNTER — Ambulatory Visit (INDEPENDENT_AMBULATORY_CARE_PROVIDER_SITE_OTHER): Payer: Medicare HMO | Admitting: Psychiatry

## 2020-02-25 DIAGNOSIS — F4010 Social phobia, unspecified: Secondary | ICD-10-CM

## 2020-02-25 DIAGNOSIS — M549 Dorsalgia, unspecified: Secondary | ICD-10-CM

## 2020-02-25 DIAGNOSIS — F422 Mixed obsessional thoughts and acts: Secondary | ICD-10-CM

## 2020-02-25 DIAGNOSIS — R5382 Chronic fatigue, unspecified: Secondary | ICD-10-CM

## 2020-02-25 DIAGNOSIS — G9332 Myalgic encephalomyelitis/chronic fatigue syndrome: Secondary | ICD-10-CM

## 2020-02-25 DIAGNOSIS — M26609 Unspecified temporomandibular joint disorder, unspecified side: Secondary | ICD-10-CM | POA: Diagnosis not present

## 2020-02-25 DIAGNOSIS — G988 Other disorders of nervous system: Secondary | ICD-10-CM | POA: Diagnosis not present

## 2020-02-25 DIAGNOSIS — F3341 Major depressive disorder, recurrent, in partial remission: Secondary | ICD-10-CM

## 2020-02-25 DIAGNOSIS — Z636 Dependent relative needing care at home: Secondary | ICD-10-CM

## 2020-02-25 DIAGNOSIS — G8929 Other chronic pain: Secondary | ICD-10-CM | POA: Diagnosis not present

## 2020-02-25 NOTE — Progress Notes (Signed)
Psychotherapy Progress Note [facility] [name], PhD LP  Patient ID: Adam Meyers     MRN: [id number] Therapy format: Individual psychotherapy Date: 02/25/2020      Start: 3:19a     Stop: 4:09p     Time Spent: 50 min Location: In-person   Session narrative (presenting needs, interim history, self-report of stressors and symptoms, applications of prior therapy, status changes, and interventions made in session) Was afraid he would have to go remote for today after a resurgence of GI symptoms that broke Monday.  Considering going on a probiotic to speed recovery and promote resistance to relapse.  Is also looking at two other supplements -- Relief Factor (proprietary antiinflammatory blend), and Balance of Nature (proprietary fruit/veg extract blend) -- but already feeling some pressure to get them right, check claims, etc.  Discussed the value of each, and endorsed willingness to diversify into nutritional self-help, while advocating the time-tested benefit of fiber, vitamins, and nutrients of emerging importance from actual vegetables, e.g., sweet potatoes and cabbage, which Pt finds fundamentally disgusting.  Discussed stress-managing strategy in approaching the supplement market so as not to activate new waves of uncertainty and stress in the process.  Defer to PCP otherwise for possibly more up-to-date information, but at this point, basically prioritize a multi-strain probiotic, shy away from megadose claims in probiotics, gain understanding about prebiotics, and pick one approach to try for a while concerning antiinflammatory nutrition and see what happens.  Hopefully that can relieve both IBS/post-illness GI sensitivity (which drives food phobias) and the frequent use of ibuprofen (which may pose other risks).  Glad to see [name] getting involved with [employer] and the hiking club on campus.  Favorable about how [name] is staying in touch and integrating.  Interacting cordially  with ex-wife, without drama, and teamwork going well for [name]'s needs is intact, including splitting expenses.  OCD provocation with getting the car inspected.  Out of habit, left his wallet in the car, then had intrusive thoughts of risk (biohazard, financial violation, mainly).  Had to pray over the worry, took a day or two to subside.  Stayed resolute about not wiping it down.  Affirmed as "seren-freakin-dipity" (his word for unplanned, unwanted E/RP).  Another big one was stopping in a grocery store bathroom two weeks ago and accidentally splashing himself from the urinal.  Talked himself through about trusting his skin, managed not to rush home, did wipe off appropriately, and took a shower at home without overwashing.  Affirmed as progress in mastery of contamination OCD.  Mondays tend to be hardest days for stress in general for having had weekend disruptions in home environment and uptick in care responsibilities with mother.    Recent med check challenged to stick with adequate dosing of Luvox (150mg  -- he had let it back to 100mg ).  Has been concerned with sexual side effects, but complying.  Particularly leery of changing meds he's used to.  Not ready to try more aggressive behavior therapy as yet.  Therapeutic modalities: Cognitive Behavioral Therapy and Solution-Oriented/Positive Psychology  Mental Status/Observations:  Appearance:   Casual     Behavior:  Appropriate  Motor:  Normal  Speech/Language:   Clear and Coherent  Affect:  Appropriate  Mood:  anxious  Thought process:  normal  Thought content:    WNL  Sensory/Perceptual disturbances:    WNL  Orientation:  Fully oriented  Attention:  Good    Concentration:  Good  Memory:  WNL  Insight:  Good  Judgment:   Fair  Impulse Control:  Good   Risk Assessment: Danger to Self: No Self-injurious Behavior: No Danger to Others: No Physical Aggression / Violence: No Duty to Warn: No Access to Firearms a concern:  No  Assessment of progress:  progressing  Diagnosis:   ICD-10-CM   1. Mixed obsessional thoughts and acts  F42.2   2. Social anxiety disorder  F40.10   3. Chronic fatigue syndrome  R53.82   4. Sensory hypersensitivity  G98.8   5. TMJ disease  M26.609   6. Chronic back pain, unspecified back location, unspecified back pain laterality  M54.9    G89.29   7. Caregiver stress  Z63.6   8. Major depressive disorder, recurrent episode, in partial remission (HCC)  F33.41    Plan:  . Consider probiotic therapy for GI issues . Endorse more antiinflammatory diet or supplementation . Continue to apply E/RP principles to spontaneous anxiety triggers.  Consider more actively seeking exposures. . Recommend continuing physical therapy as tolerated and approved, and/or exercises learned in PT to further free up musculature and C-spine issues . Other recommendations/advice as may be noted above . Continue to utilize previously learned skills ad lib . Maintain medication as prescribed and work faithfully with relevant prescriber(s) if any changes are desired or seem indicated . Call the clinic on-call service, present to ER, or call 911 if any life-threatening psychiatric crisis Return in about 3 weeks (around 03/17/2020) for time as available. . Already scheduled visit in this office 03/17/2020.  [name], PhD [name], PhD LP Clinical Psychologist, [facility] [address], [address] [place], [place] [address] [phone]

## 2020-03-04 DIAGNOSIS — M545 Low back pain: Secondary | ICD-10-CM | POA: Diagnosis not present

## 2020-03-04 DIAGNOSIS — S39012D Strain of muscle, fascia and tendon of lower back, subsequent encounter: Secondary | ICD-10-CM | POA: Diagnosis not present

## 2020-03-04 DIAGNOSIS — M6281 Muscle weakness (generalized): Secondary | ICD-10-CM | POA: Diagnosis not present

## 2020-03-04 DIAGNOSIS — R262 Difficulty in walking, not elsewhere classified: Secondary | ICD-10-CM | POA: Diagnosis not present

## 2020-03-04 DIAGNOSIS — R519 Headache, unspecified: Secondary | ICD-10-CM | POA: Diagnosis not present

## 2020-03-04 DIAGNOSIS — M542 Cervicalgia: Secondary | ICD-10-CM | POA: Diagnosis not present

## 2020-03-04 DIAGNOSIS — M2560 Stiffness of unspecified joint, not elsewhere classified: Secondary | ICD-10-CM | POA: Diagnosis not present

## 2020-03-09 DIAGNOSIS — R262 Difficulty in walking, not elsewhere classified: Secondary | ICD-10-CM | POA: Diagnosis not present

## 2020-03-09 DIAGNOSIS — R519 Headache, unspecified: Secondary | ICD-10-CM | POA: Diagnosis not present

## 2020-03-09 DIAGNOSIS — M6281 Muscle weakness (generalized): Secondary | ICD-10-CM | POA: Diagnosis not present

## 2020-03-09 DIAGNOSIS — S39012D Strain of muscle, fascia and tendon of lower back, subsequent encounter: Secondary | ICD-10-CM | POA: Diagnosis not present

## 2020-03-09 DIAGNOSIS — M542 Cervicalgia: Secondary | ICD-10-CM | POA: Diagnosis not present

## 2020-03-09 DIAGNOSIS — M545 Low back pain: Secondary | ICD-10-CM | POA: Diagnosis not present

## 2020-03-09 DIAGNOSIS — M2560 Stiffness of unspecified joint, not elsewhere classified: Secondary | ICD-10-CM | POA: Diagnosis not present

## 2020-03-11 DIAGNOSIS — M6281 Muscle weakness (generalized): Secondary | ICD-10-CM | POA: Diagnosis not present

## 2020-03-11 DIAGNOSIS — S39012D Strain of muscle, fascia and tendon of lower back, subsequent encounter: Secondary | ICD-10-CM | POA: Diagnosis not present

## 2020-03-11 DIAGNOSIS — M542 Cervicalgia: Secondary | ICD-10-CM | POA: Diagnosis not present

## 2020-03-11 DIAGNOSIS — R262 Difficulty in walking, not elsewhere classified: Secondary | ICD-10-CM | POA: Diagnosis not present

## 2020-03-11 DIAGNOSIS — M2560 Stiffness of unspecified joint, not elsewhere classified: Secondary | ICD-10-CM | POA: Diagnosis not present

## 2020-03-11 DIAGNOSIS — R519 Headache, unspecified: Secondary | ICD-10-CM | POA: Diagnosis not present

## 2020-03-11 DIAGNOSIS — M545 Low back pain: Secondary | ICD-10-CM | POA: Diagnosis not present

## 2020-03-17 ENCOUNTER — Other Ambulatory Visit: Payer: Self-pay

## 2020-03-17 ENCOUNTER — Ambulatory Visit (INDEPENDENT_AMBULATORY_CARE_PROVIDER_SITE_OTHER): Payer: Medicare HMO | Admitting: Psychiatry

## 2020-03-17 DIAGNOSIS — F331 Major depressive disorder, recurrent, moderate: Secondary | ICD-10-CM

## 2020-03-17 DIAGNOSIS — R5382 Chronic fatigue, unspecified: Secondary | ICD-10-CM

## 2020-03-17 DIAGNOSIS — F4010 Social phobia, unspecified: Secondary | ICD-10-CM

## 2020-03-17 DIAGNOSIS — M549 Dorsalgia, unspecified: Secondary | ICD-10-CM

## 2020-03-17 DIAGNOSIS — F422 Mixed obsessional thoughts and acts: Secondary | ICD-10-CM

## 2020-03-17 DIAGNOSIS — G988 Other disorders of nervous system: Secondary | ICD-10-CM

## 2020-03-17 DIAGNOSIS — G9332 Myalgic encephalomyelitis/chronic fatigue syndrome: Secondary | ICD-10-CM

## 2020-03-17 DIAGNOSIS — G8929 Other chronic pain: Secondary | ICD-10-CM

## 2020-03-17 NOTE — Progress Notes (Signed)
Psychotherapy Progress Note [facility] Adam Czar, PhD LP  Patient ID: Adam Meyers     MRN: [id number] Therapy format: Individual psychotherapy Date: 03/17/2020      Start: 3:13p     Stop: 4:00p     Time Spent: 47 min Location: In-person   Session narrative (presenting needs, interim history, self-report of stressors and symptoms, applications of prior therapy, status changes, and interventions made in session) C/o energy crashes an hour after he first eats about 12:30p.  Stomach bothered.  Off nighttime milk.  Has begun using Brita-filtered water again.  Gets plenty of water.  Has caffeine-free soda, 2 cans, at night.  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  Typically Gatorade (also HCFS) at first meal (midday).  Still has physical therapy, 2/wk, which is some lift, but right hip giving him trouble, which is interfering with sleep.  8am shift change with workers also interrupts sleep.  Habit of zonking out in chair for an hour in the evening.  Food restrictive, chicken noodle soup and [place] sandwiches.  No vegetables or fruit to speak of.  Challenged to summon himself to shop a bit, cook a bit more to make it happen, given that the man who eats, the man who works to provide it, and the man who wants to feel better are all the same person.    Re. [name] -- has found himself more sympathetic toward her, re hard time at work, need for car, plus he got a dose of being taken care of while sick at and after taking [name] to [place].  Quandary whether he's "taking the easy way out" somehow (?).  Normalized still having feelings for the woman he promised for life, with whom he made a child and a life for a long time, who used to turn him on sexually, and who has -- fairly inconveniently -- been getting over traits which became offputting before.    Therapeutic modalities: Cognitive Behavioral Therapy, Solution-Oriented/Positive Psychology and Ego-Supportive  Mental  Status/Observations:  Appearance:   Casual     Behavior:  Appropriate  Motor:  Normal  Speech/Language:   Clear and Coherent  Affect:  Appropriate  Mood:  anxious and dysthymic  Thought process:  normal  Thought content:    WNL  Sensory/Perceptual disturbances:    WNL  Orientation:  Fully oriented  Attention:  Good    Concentration:  Good  Memory:  WNL  Insight:    Fair  Judgment:   Good  Impulse Control:  Good   Risk Assessment: Danger to Self: No Self-injurious Behavior: No Danger to Others: No Physical Aggression / Violence: No Duty to Warn: No Access to Firearms a concern: No  Assessment of progress:  stabilized  Diagnosis:   ICD-10-CM   1. Mixed obsessional thoughts and acts  F42.2   2. Social anxiety disorder  F40.10   3. Chronic fatigue syndrome  R53.82   4. Chronic back pain, unspecified back location, unspecified back pain laterality  M54.9    G89.29   5. 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 Major depressive disorder, recurrent episode, moderate (HCC)  F33.1    Plan:  . Work on [employer], challenges self to prepare vegetables in some form . Self-affirm it is okay to still have feelings for [name] without it meaning anything dysfunctional or risky and continue to handle coparenting and financial business together as indicated . Tolerate reductions in personal care visits for mother and work faithfully with sister organizing most appropriate  care . Other recommendations/advice as may be noted above . Continue to utilize previously learned skills ad lib . Maintain medication as prescribed and work faithfully with relevant prescriber(s) if any changes are desired or seem indicated . Call the clinic on-call service, present to ER, or call 911 if any life-threatening psychiatric crisis Return for time as available. . Already scheduled visit in this office 04/05/2020.  Adam Fries, PhD Adam Czar, PhD LP Clinical Psychologist, [facility]  Group [facility] [address] [place], [place] [address] [phone]

## 2020-03-18 DIAGNOSIS — R519 Headache, unspecified: Secondary | ICD-10-CM | POA: Diagnosis not present

## 2020-03-18 DIAGNOSIS — M545 Low back pain: Secondary | ICD-10-CM | POA: Diagnosis not present

## 2020-03-18 DIAGNOSIS — M6281 Muscle weakness (generalized): Secondary | ICD-10-CM | POA: Diagnosis not present

## 2020-03-18 DIAGNOSIS — M2560 Stiffness of unspecified joint, not elsewhere classified: Secondary | ICD-10-CM | POA: Diagnosis not present

## 2020-03-18 DIAGNOSIS — M542 Cervicalgia: Secondary | ICD-10-CM | POA: Diagnosis not present

## 2020-03-18 DIAGNOSIS — R262 Difficulty in walking, not elsewhere classified: Secondary | ICD-10-CM | POA: Diagnosis not present

## 2020-03-18 DIAGNOSIS — S39012D Strain of muscle, fascia and tendon of lower back, subsequent encounter: Secondary | ICD-10-CM | POA: Diagnosis not present

## 2020-03-23 DIAGNOSIS — S39012D Strain of muscle, fascia and tendon of lower back, subsequent encounter: Secondary | ICD-10-CM | POA: Diagnosis not present

## 2020-03-23 DIAGNOSIS — M6281 Muscle weakness (generalized): Secondary | ICD-10-CM | POA: Diagnosis not present

## 2020-03-23 DIAGNOSIS — R519 Headache, unspecified: Secondary | ICD-10-CM | POA: Diagnosis not present

## 2020-03-23 DIAGNOSIS — M545 Low back pain: Secondary | ICD-10-CM | POA: Diagnosis not present

## 2020-03-23 DIAGNOSIS — M542 Cervicalgia: Secondary | ICD-10-CM | POA: Diagnosis not present

## 2020-03-23 DIAGNOSIS — R262 Difficulty in walking, not elsewhere classified: Secondary | ICD-10-CM | POA: Diagnosis not present

## 2020-03-23 DIAGNOSIS — M2560 Stiffness of unspecified joint, not elsewhere classified: Secondary | ICD-10-CM | POA: Diagnosis not present

## 2020-04-05 ENCOUNTER — Ambulatory Visit: Payer: Medicare HMO | Admitting: Psychiatry

## 2020-04-08 DIAGNOSIS — M542 Cervicalgia: Secondary | ICD-10-CM | POA: Diagnosis not present

## 2020-04-08 DIAGNOSIS — M6281 Muscle weakness (generalized): Secondary | ICD-10-CM | POA: Diagnosis not present

## 2020-04-08 DIAGNOSIS — S39012D Strain of muscle, fascia and tendon of lower back, subsequent encounter: Secondary | ICD-10-CM | POA: Diagnosis not present

## 2020-04-08 DIAGNOSIS — R262 Difficulty in walking, not elsewhere classified: Secondary | ICD-10-CM | POA: Diagnosis not present

## 2020-04-08 DIAGNOSIS — M2560 Stiffness of unspecified joint, not elsewhere classified: Secondary | ICD-10-CM | POA: Diagnosis not present

## 2020-04-08 DIAGNOSIS — M545 Low back pain, unspecified: Secondary | ICD-10-CM | POA: Diagnosis not present

## 2020-04-08 DIAGNOSIS — R519 Headache, unspecified: Secondary | ICD-10-CM | POA: Diagnosis not present

## 2020-04-15 DIAGNOSIS — M545 Low back pain, unspecified: Secondary | ICD-10-CM | POA: Diagnosis not present

## 2020-04-15 DIAGNOSIS — R262 Difficulty in walking, not elsewhere classified: Secondary | ICD-10-CM | POA: Diagnosis not present

## 2020-04-15 DIAGNOSIS — M542 Cervicalgia: Secondary | ICD-10-CM | POA: Diagnosis not present

## 2020-04-15 DIAGNOSIS — M2560 Stiffness of unspecified joint, not elsewhere classified: Secondary | ICD-10-CM | POA: Diagnosis not present

## 2020-04-15 DIAGNOSIS — M6281 Muscle weakness (generalized): Secondary | ICD-10-CM | POA: Diagnosis not present

## 2020-04-15 DIAGNOSIS — R519 Headache, unspecified: Secondary | ICD-10-CM | POA: Diagnosis not present

## 2020-04-15 DIAGNOSIS — S39012D Strain of muscle, fascia and tendon of lower back, subsequent encounter: Secondary | ICD-10-CM | POA: Diagnosis not present

## 2020-04-28 ENCOUNTER — Ambulatory Visit (INDEPENDENT_AMBULATORY_CARE_PROVIDER_SITE_OTHER): Payer: Medicare HMO | Admitting: Psychiatry

## 2020-04-28 ENCOUNTER — Other Ambulatory Visit: Payer: Self-pay

## 2020-04-28 DIAGNOSIS — R5382 Chronic fatigue, unspecified: Secondary | ICD-10-CM

## 2020-04-28 DIAGNOSIS — Z636 Dependent relative needing care at home: Secondary | ICD-10-CM | POA: Diagnosis not present

## 2020-04-28 DIAGNOSIS — E638 Other specified nutritional deficiencies: Secondary | ICD-10-CM | POA: Diagnosis not present

## 2020-04-28 DIAGNOSIS — M549 Dorsalgia, unspecified: Secondary | ICD-10-CM

## 2020-04-28 DIAGNOSIS — F422 Mixed obsessional thoughts and acts: Secondary | ICD-10-CM

## 2020-04-28 DIAGNOSIS — G479 Sleep disorder, unspecified: Secondary | ICD-10-CM | POA: Diagnosis not present

## 2020-04-28 DIAGNOSIS — G8929 Other chronic pain: Secondary | ICD-10-CM

## 2020-04-28 DIAGNOSIS — E639 Nutritional deficiency, unspecified: Secondary | ICD-10-CM

## 2020-04-28 DIAGNOSIS — F331 Major depressive disorder, recurrent, moderate: Secondary | ICD-10-CM | POA: Diagnosis not present

## 2020-04-28 DIAGNOSIS — Z638 Other specified problems related to primary support group: Secondary | ICD-10-CM | POA: Diagnosis not present

## 2020-04-28 DIAGNOSIS — G9332 Myalgic encephalomyelitis/chronic fatigue syndrome: Secondary | ICD-10-CM

## 2020-04-28 NOTE — Progress Notes (Signed)
Psychotherapy Progress Note [facility] [name], PhD LP  Patient ID: Adam Meyers     MRN: [id number] Therapy format: Individual psychotherapy Date: 04/28/2020      Start: 3:28p     Stop: 4:15p     Time Spent: 47 min Location: In-person   Session narrative (presenting needs, interim history, self-report of stressors and symptoms, applications of prior therapy, status changes, and interventions made in session) COVID exposure 3 weeks ago, no symptoms, tested negative.  Since last seen, has returned broccoli to his diet, traded [place] sandwiches for chicken breast, and cut back starches.  Laid off dessert lately.  Branching out in soup choices.  Digestion may be normalizing a bit more, with less severe bowel urges.  Feeding schedule can be erratic, still.    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  Always light sleep anyway.  Pt and siblings pursuing a claim for VA benefits father never pursued but mother is eligible for, which will include back pay for pension and home health coverage.  Sister [name] reneged on taking some time to be responsible for mother's care.  Hopeful of getting back to it after settling financing better.  Also could use steadier sleep schedule.  Mom has done OK with home care.    Physical therapy got interrupted by schedule changes, and suffering more pain and tightness.    Things are largely quiet with son at [employer] and ex-wife in [place].  She may be poised to have her debt problems controlled enough to get a car after many months without.  Curious question from her how much money he needs for Xmas; any spontaneous talk of money raises a sense of alarm for him given her history of unreimbursed and secret "borrowing", but nothing apparent that seems to threaten stability, new worries now.  Therapeutic modalities: Cognitive Behavioral Therapy and  Solution-Oriented/Positive Psychology  Mental Status/Observations:  Appearance:   Casual     Behavior:  Appropriate  Motor:  Normal  Speech/Language:   Clear and Coherent  Affect:  Appropriate  Mood:  anxious  Thought process:  normal  Thought content:    worries  Sensory/Perceptual disturbances:    WNL  Orientation:  Fully oriented  Attention:  Good    Concentration:  Fair  Memory:  WNL  Insight:    Good  Judgment:   Good  Impulse Control:  Fair   Risk Assessment: Danger to Self: No Self-injurious Behavior: No Danger to Others: No Physical Aggression / Violence: No Duty to Warn: No Access to Firearms a concern: No  Assessment of progress:  stabilized  Diagnosis:   ICD-10-CM   1. Mixed obsessional thoughts and acts  F42.2   2. Major depressive disorder, recurrent episode, moderate (HCC)  F33.1   3. Nutritional problems  E63.8   4. Sleep disturbance  G47.9   5. Chronic fatigue syndrome  R53.82   6. Relationship problem with family member  Z63.8   7. Chronic back pain, unspecified back location, unspecified back pain laterality  M54.9    G89.29   8. Caregiver stress  Z63.6    Plan:  . Continue to branch out nutritionally -- likely deficient in some nutrients for several years of anxiety-driven food avoidance of certain foods and bouts of severely restricted range -- and improve balance of carbs to vegetables .  Marland Kitchen Work through issues with sister and  mother programming in-home care and responsibility-sharing . Stabilize sleep schedule as much as possible regardless of home care issues . Other recommendations/advice as may be noted above . Continue to utilize previously learned skills ad lib . Maintain medication as prescribed and work faithfully with relevant prescriber(s) if any changes are desired or seem indicated . Call the clinic on-call service, present to ER, or call 911 if any life-threatening psychiatric crisis Return for session(s) already scheduled. . Already  scheduled visit in this office 05/12/2020.  [name], PhD [name], PhD LP Clinical Psychologist, [facility] [address] [place], [place] [address] [phone]

## 2020-04-29 DIAGNOSIS — M6281 Muscle weakness (generalized): Secondary | ICD-10-CM | POA: Diagnosis not present

## 2020-04-29 DIAGNOSIS — M2560 Stiffness of unspecified joint, not elsewhere classified: Secondary | ICD-10-CM | POA: Diagnosis not present

## 2020-04-29 DIAGNOSIS — R519 Headache, unspecified: Secondary | ICD-10-CM | POA: Diagnosis not present

## 2020-04-29 DIAGNOSIS — R262 Difficulty in walking, not elsewhere classified: Secondary | ICD-10-CM | POA: Diagnosis not present

## 2020-04-29 DIAGNOSIS — M542 Cervicalgia: Secondary | ICD-10-CM | POA: Diagnosis not present

## 2020-04-29 DIAGNOSIS — M545 Low back pain, unspecified: Secondary | ICD-10-CM | POA: Diagnosis not present

## 2020-04-29 DIAGNOSIS — S39012D Strain of muscle, fascia and tendon of lower back, subsequent encounter: Secondary | ICD-10-CM | POA: Diagnosis not present

## 2020-05-07 DIAGNOSIS — Z79899 Other long term (current) drug therapy: Secondary | ICD-10-CM | POA: Diagnosis not present

## 2020-05-07 DIAGNOSIS — Z0000 Encounter for general adult medical examination without abnormal findings: Secondary | ICD-10-CM | POA: Diagnosis not present

## 2020-05-07 DIAGNOSIS — Z8639 Personal history of other endocrine, nutritional and metabolic disease: Secondary | ICD-10-CM | POA: Diagnosis not present

## 2020-05-07 DIAGNOSIS — E782 Mixed hyperlipidemia: Secondary | ICD-10-CM | POA: Diagnosis not present

## 2020-05-07 DIAGNOSIS — Z125 Encounter for screening for malignant neoplasm of prostate: Secondary | ICD-10-CM | POA: Diagnosis not present

## 2020-05-07 DIAGNOSIS — F419 Anxiety disorder, unspecified: Secondary | ICD-10-CM | POA: Diagnosis not present

## 2020-05-07 DIAGNOSIS — Z23 Encounter for immunization: Secondary | ICD-10-CM | POA: Diagnosis not present

## 2020-05-12 ENCOUNTER — Ambulatory Visit: Payer: Medicare HMO | Admitting: Psychiatry

## 2020-05-13 DIAGNOSIS — R262 Difficulty in walking, not elsewhere classified: Secondary | ICD-10-CM | POA: Diagnosis not present

## 2020-05-13 DIAGNOSIS — M545 Low back pain, unspecified: Secondary | ICD-10-CM | POA: Diagnosis not present

## 2020-05-13 DIAGNOSIS — R519 Headache, unspecified: Secondary | ICD-10-CM | POA: Diagnosis not present

## 2020-05-13 DIAGNOSIS — M6281 Muscle weakness (generalized): Secondary | ICD-10-CM | POA: Diagnosis not present

## 2020-05-13 DIAGNOSIS — M2560 Stiffness of unspecified joint, not elsewhere classified: Secondary | ICD-10-CM | POA: Diagnosis not present

## 2020-05-13 DIAGNOSIS — M542 Cervicalgia: Secondary | ICD-10-CM | POA: Diagnosis not present

## 2020-05-13 DIAGNOSIS — S39012D Strain of muscle, fascia and tendon of lower back, subsequent encounter: Secondary | ICD-10-CM | POA: Diagnosis not present

## 2020-05-25 DIAGNOSIS — S39012D Strain of muscle, fascia and tendon of lower back, subsequent encounter: Secondary | ICD-10-CM | POA: Diagnosis not present

## 2020-05-25 DIAGNOSIS — R262 Difficulty in walking, not elsewhere classified: Secondary | ICD-10-CM | POA: Diagnosis not present

## 2020-05-25 DIAGNOSIS — M6281 Muscle weakness (generalized): Secondary | ICD-10-CM | POA: Diagnosis not present

## 2020-05-25 DIAGNOSIS — M2560 Stiffness of unspecified joint, not elsewhere classified: Secondary | ICD-10-CM | POA: Diagnosis not present

## 2020-05-25 DIAGNOSIS — R519 Headache, unspecified: Secondary | ICD-10-CM | POA: Diagnosis not present

## 2020-05-25 DIAGNOSIS — M542 Cervicalgia: Secondary | ICD-10-CM | POA: Diagnosis not present

## 2020-05-25 DIAGNOSIS — M545 Low back pain, unspecified: Secondary | ICD-10-CM | POA: Diagnosis not present

## 2020-05-26 ENCOUNTER — Ambulatory Visit (INDEPENDENT_AMBULATORY_CARE_PROVIDER_SITE_OTHER): Payer: Medicare HMO | Admitting: Psychiatry

## 2020-05-26 ENCOUNTER — Other Ambulatory Visit: Payer: Self-pay

## 2020-05-26 DIAGNOSIS — F422 Mixed obsessional thoughts and acts: Secondary | ICD-10-CM | POA: Diagnosis not present

## 2020-05-26 DIAGNOSIS — E639 Nutritional deficiency, unspecified: Secondary | ICD-10-CM

## 2020-05-26 DIAGNOSIS — M549 Dorsalgia, unspecified: Secondary | ICD-10-CM | POA: Diagnosis not present

## 2020-05-26 DIAGNOSIS — Z638 Other specified problems related to primary support group: Secondary | ICD-10-CM

## 2020-05-26 DIAGNOSIS — Z636 Dependent relative needing care at home: Secondary | ICD-10-CM

## 2020-05-26 DIAGNOSIS — G8929 Other chronic pain: Secondary | ICD-10-CM | POA: Diagnosis not present

## 2020-05-26 DIAGNOSIS — E638 Other specified nutritional deficiencies: Secondary | ICD-10-CM

## 2020-05-26 DIAGNOSIS — F331 Major depressive disorder, recurrent, moderate: Secondary | ICD-10-CM

## 2020-05-26 NOTE — Progress Notes (Signed)
Psychotherapy Progress Note [facility] [name], PhD LP  Patient ID: Adam Meyers     MRN: [id number] Therapy format: Individual psychotherapy Date: 05/26/2020      Start: 3:17p     Stop: 4:07p     Time Spent: 50 min Location: In-person   Session narrative (presenting needs, interim history, self-report of stressors and symptoms, applications of prior therapy, status changes, and interventions made in session) Thanksgiving more sedate -- [name] was in [place] with [name], sister and brother in law loose enough about timing and prep that a 5:30 dinner waited till 8:30pm.  Sitting around waiting and bored worked his nerves to the point of impatience, plus brother-in-law had been sick for a week, rasing worry about passing infection to self and mother.  Felt privately incensed about how much didn't go to plan, largely because it always doesn't go to plan anyway, and he winds up with cold or late food.  Did enjoy seeing niece and nephew-in-law.    Caregiving stress with mother for cutting back in-home sitter hours now and for sister's alleged excuse-making and begging off of agreed-upon duties.  More irritating how sister will beg off but then criticize how he handles things when it's left to him.  Mostly, sounds like there tends to be a game of "responsibility hot potato".  Aricept and vitamin E helping mother mentally, but only so much she is capable of.  Back to physical therapy yesterday himself, finally.  Has noticed tension and burning around neck and upper back.    Admits he misses his family.  Largely son [name], the old sense of fun there used to be with [name] decorating up for Christmas and he pitching in.  Nothing to figure out, just a sadness.  Proud of [name] for losing weight -- 50 lbs -- and helped by his encouragement to address his own fitness.  Affirmed and encouraged.  Therapeutic modalities: Cognitive Behavioral Therapy and Solution-Oriented/Positive  Psychology  Mental Status/Observations:  Appearance:   Casual     Behavior:  Appropriate  Motor:  Normal  Speech/Language:   Clear and Coherent  Affect:  Appropriate  Mood:  dysthymic and less  Thought process:  normal  Thought content:    WNL  Sensory/Perceptual disturbances:    WNL  Orientation:  Fully oriented  Attention:  Good    Concentration:  Good  Memory:  WNL  Insight:    Good  Judgment:   Good  Impulse Control:  Good   Risk Assessment: Danger to Self: No Self-injurious Behavior: No Danger to Others: No Physical Aggression / Violence: No Duty to Warn: No Access to Firearms a concern: No  Assessment of progress:  stabilized  Diagnosis:   ICD-10-CM   1. Major depressive disorder, recurrent episode, moderate (HCC)  F33.1   2. Caregiver stress  Z63.6   3. Relationship problem with family member  Z63.8   4. Chronic back pain, unspecified back location, unspecified back pain laterality  M54.9    G89.29   5. Mixed obsessional thoughts and acts  F42.2   6. Nutritional problems  E63.8    Plan:  . Continue PT . Continue trying to diversify diet . Continue recommendations how to assert with sister to have frank agreements rather than backbiting . Other recommendations/advice as may be noted above . Continue to utilize previously learned skills ad lib . Maintain medication as prescribed and work faithfully with relevant prescriber(s) if any changes are desired or seem indicated .  Call the clinic on-call service, present to ER, or call 911 if any life-threatening psychiatric crisis Return in about 3 weeks (around 06/16/2020). . Already scheduled visit in this office 06/16/2020.  [name], PhD [name], PhD LP Clinical Psychologist, [facility] [address], [address] [place], [place] [address] [phone]

## 2020-05-27 DIAGNOSIS — M542 Cervicalgia: Secondary | ICD-10-CM | POA: Diagnosis not present

## 2020-05-27 DIAGNOSIS — R262 Difficulty in walking, not elsewhere classified: Secondary | ICD-10-CM | POA: Diagnosis not present

## 2020-05-27 DIAGNOSIS — M6281 Muscle weakness (generalized): Secondary | ICD-10-CM | POA: Diagnosis not present

## 2020-05-27 DIAGNOSIS — R519 Headache, unspecified: Secondary | ICD-10-CM | POA: Diagnosis not present

## 2020-05-27 DIAGNOSIS — M2560 Stiffness of unspecified joint, not elsewhere classified: Secondary | ICD-10-CM | POA: Diagnosis not present

## 2020-05-27 DIAGNOSIS — S39012D Strain of muscle, fascia and tendon of lower back, subsequent encounter: Secondary | ICD-10-CM | POA: Diagnosis not present

## 2020-05-27 DIAGNOSIS — M545 Low back pain, unspecified: Secondary | ICD-10-CM | POA: Diagnosis not present

## 2020-06-01 DIAGNOSIS — R519 Headache, unspecified: Secondary | ICD-10-CM | POA: Diagnosis not present

## 2020-06-01 DIAGNOSIS — M545 Low back pain, unspecified: Secondary | ICD-10-CM | POA: Diagnosis not present

## 2020-06-01 DIAGNOSIS — R262 Difficulty in walking, not elsewhere classified: Secondary | ICD-10-CM | POA: Diagnosis not present

## 2020-06-01 DIAGNOSIS — M6281 Muscle weakness (generalized): Secondary | ICD-10-CM | POA: Diagnosis not present

## 2020-06-01 DIAGNOSIS — S39012D Strain of muscle, fascia and tendon of lower back, subsequent encounter: Secondary | ICD-10-CM | POA: Diagnosis not present

## 2020-06-01 DIAGNOSIS — M2560 Stiffness of unspecified joint, not elsewhere classified: Secondary | ICD-10-CM | POA: Diagnosis not present

## 2020-06-01 DIAGNOSIS — M542 Cervicalgia: Secondary | ICD-10-CM | POA: Diagnosis not present

## 2020-06-08 DIAGNOSIS — S39012D Strain of muscle, fascia and tendon of lower back, subsequent encounter: Secondary | ICD-10-CM | POA: Diagnosis not present

## 2020-06-08 DIAGNOSIS — M2560 Stiffness of unspecified joint, not elsewhere classified: Secondary | ICD-10-CM | POA: Diagnosis not present

## 2020-06-08 DIAGNOSIS — R519 Headache, unspecified: Secondary | ICD-10-CM | POA: Diagnosis not present

## 2020-06-08 DIAGNOSIS — R262 Difficulty in walking, not elsewhere classified: Secondary | ICD-10-CM | POA: Diagnosis not present

## 2020-06-08 DIAGNOSIS — M545 Low back pain, unspecified: Secondary | ICD-10-CM | POA: Diagnosis not present

## 2020-06-08 DIAGNOSIS — M6281 Muscle weakness (generalized): Secondary | ICD-10-CM | POA: Diagnosis not present

## 2020-06-08 DIAGNOSIS — M542 Cervicalgia: Secondary | ICD-10-CM | POA: Diagnosis not present

## 2020-06-15 DIAGNOSIS — R262 Difficulty in walking, not elsewhere classified: Secondary | ICD-10-CM | POA: Diagnosis not present

## 2020-06-15 DIAGNOSIS — S39012D Strain of muscle, fascia and tendon of lower back, subsequent encounter: Secondary | ICD-10-CM | POA: Diagnosis not present

## 2020-06-15 DIAGNOSIS — M2560 Stiffness of unspecified joint, not elsewhere classified: Secondary | ICD-10-CM | POA: Diagnosis not present

## 2020-06-15 DIAGNOSIS — M6281 Muscle weakness (generalized): Secondary | ICD-10-CM | POA: Diagnosis not present

## 2020-06-15 DIAGNOSIS — M545 Low back pain, unspecified: Secondary | ICD-10-CM | POA: Diagnosis not present

## 2020-06-15 DIAGNOSIS — R519 Headache, unspecified: Secondary | ICD-10-CM | POA: Diagnosis not present

## 2020-06-15 DIAGNOSIS — M542 Cervicalgia: Secondary | ICD-10-CM | POA: Diagnosis not present

## 2020-06-16 ENCOUNTER — Ambulatory Visit (INDEPENDENT_AMBULATORY_CARE_PROVIDER_SITE_OTHER): Payer: Medicare HMO | Admitting: Psychiatry

## 2020-06-16 DIAGNOSIS — F331 Major depressive disorder, recurrent, moderate: Secondary | ICD-10-CM | POA: Diagnosis not present

## 2020-06-16 DIAGNOSIS — Z636 Dependent relative needing care at home: Secondary | ICD-10-CM | POA: Diagnosis not present

## 2020-06-16 DIAGNOSIS — F4010 Social phobia, unspecified: Secondary | ICD-10-CM | POA: Diagnosis not present

## 2020-06-16 DIAGNOSIS — Z638 Other specified problems related to primary support group: Secondary | ICD-10-CM | POA: Diagnosis not present

## 2020-06-16 DIAGNOSIS — Z630 Problems in relationship with spouse or partner: Secondary | ICD-10-CM | POA: Diagnosis not present

## 2020-06-16 DIAGNOSIS — F422 Mixed obsessional thoughts and acts: Secondary | ICD-10-CM

## 2020-06-16 NOTE — Progress Notes (Signed)
Psychotherapy Progress Note [facility] [name], PhD LP  Patient ID: Adam Meyers     MRN: [id number] Therapy format: Individual psychotherapy Date: 06/16/2020      Start: 3:28p     Stop: 4:17p     Time Spent: 49 min Location: Telehealth visit -- I connected with this patient by an approved telecommunication method (audio only), with his informed consent, and verifying identity and patient privacy.  I was located at my office and patient at his home.  As needed, we discussed the limitations, risks, and security and privacy concerns associated with telehealth service, including the availability and conditions which currently govern in-person appointments and the possibility that 3rd-party payment may not be fully guaranteed and he may be responsible for charges.  After he indicated understanding, we proceeded with the session.  Also discussed treatment planning, as needed, including ongoing verbal agreement with the plan, the opportunity to ask and answer all questions, his demonstrated understanding of instructions, and his readiness to call the office should symptoms worsen or he feels he is in a crisis state and needs more immediate and tangible assistance.   Session narrative (presenting needs, interim history, self-report of stressors and symptoms, applications of prior therapy, status changes, and interventions made in session) Exposed to COVID through mother's home aide.  No symptoms for [name].  Family party for past weekend was cancelled d/t a member contracting COVID, one of a group that have been very socially active of late.  Some relief in not having to go to the event.  Has made decision not to attend the old family church Xmas Eve, primarily d/t COVID risk.  Was hoping to do more of what used to be normal, but can easily accommodate video worship under the circumstances.  Emotional conflict anticipated with [name] and [name] coming to [place] for Xmas Eve, but now  she will be staying in [place] for virus safety and [name] will be coming by train Xmas Day.  Got a perverse glee out of sister asking why he's arriving "so late" (4:30, when Thanksgiving stretched to 8:30 before eating).  A little new sparring with sister over who takes responsibilities with mother's care and when.  Chance conversation with BIL indicated something of a guarded perspective about whether [name] would have all the responsibility, irritated at the suggestion he would dump unfairly.  Partly at issue are intimate care tasks with mother, tasks which are difficult for any son, far more provocative with OCD.  Partly at issue the ongoing difficulty getting sister to move off of her abiding sense of feeling taken for granted to empathize how truly icky it feels to him.  Relieved that the staffing agency will be present for night tasks during the holiday.  Separately, amazed and disturbed that BIL wants a COVID+ cousin to attend anyway.  Does feel ready to say if he can't abide that risk for himself, or mother or son.  Jan 7 [name] will be going back to school.  [name] plans to stay a couple nights in [place], plus [name] would like to get a vacation from mother care and confinement to home by going with them.  Apprehension about the possibility of lodging with ex-wife, mostly the risk of wanting her back.  Support/empathy provided, rationally appraised the risk, concluded he can miss her and be neither devastated nor afraid.  And if by some chance they re-spark the old relationship, he will be able to judge whether it's trustworthy or not,  without having to worry whether he is sinning or crippling himself emotionally.  Therapeutic modalities: Cognitive Behavioral Therapy, Solution-Oriented/Positive Psychology and Humanistic/Existential  Mental Status/Observations:  Appearance:   Not assessed     Behavior:  Appropriate  Motor:  Not assessed  Speech/Language:   Clear and Coherent  Affect:  Not assessed   Mood:  anxious  Thought process:  normal  Thought content:    Obsessions  Sensory/Perceptual disturbances:    WNL  Orientation:  Fully oriented  Attention:  Fair    Concentration:  Good  Memory:  WNL  Insight:    Good  Judgment:   Good  Impulse Control:  Good   Risk Assessment: Danger to Self: No Self-injurious Behavior: No Danger to Others: No Physical Aggression / Violence: No Duty to Warn: No Access to Firearms a concern: No  Assessment of progress:  stabilized  Diagnosis:   ICD-10-CM   1. Major depressive disorder, recurrent episode, moderate (HCC)  F33.1   2. Mixed obsessional thoughts and acts  F42.2   3. Caregiver stress  Z63.6   4. Social anxiety disorder  F40.10   5. Relationship problem with family member  Z63.8   6. Relationship problem between partners  Z63.0    Plan:  . Work out division of responsibilities frankly with sister, be willing to say what his aversion is and ask her to take it seriously, not just figure she should understand . Self-affirm two right answers to the problem of lodging with ex-wife  . Other recommendations/advice as may be noted above . Continue to utilize previously learned skills ad lib . Maintain medication as prescribed and work faithfully with relevant prescriber(s) if any changes are desired or seem indicated . Call the clinic on-call service, present to ER, or call 911 if any life-threatening psychiatric crisis Return in about 3 weeks (around 07/07/2020). . Already scheduled visit in this office 07/07/2020.  [name], PhD [name], PhD LP Clinical Psychologist, [facility] [address] [place], [place] [address] [phone]

## 2020-06-29 DIAGNOSIS — M6281 Muscle weakness (generalized): Secondary | ICD-10-CM | POA: Diagnosis not present

## 2020-06-29 DIAGNOSIS — M545 Low back pain, unspecified: Secondary | ICD-10-CM | POA: Diagnosis not present

## 2020-06-29 DIAGNOSIS — R262 Difficulty in walking, not elsewhere classified: Secondary | ICD-10-CM | POA: Diagnosis not present

## 2020-06-29 DIAGNOSIS — M2560 Stiffness of unspecified joint, not elsewhere classified: Secondary | ICD-10-CM | POA: Diagnosis not present

## 2020-06-29 DIAGNOSIS — M542 Cervicalgia: Secondary | ICD-10-CM | POA: Diagnosis not present

## 2020-06-29 DIAGNOSIS — S39012D Strain of muscle, fascia and tendon of lower back, subsequent encounter: Secondary | ICD-10-CM | POA: Diagnosis not present

## 2020-06-29 DIAGNOSIS — R519 Headache, unspecified: Secondary | ICD-10-CM | POA: Diagnosis not present

## 2020-07-07 ENCOUNTER — Ambulatory Visit (INDEPENDENT_AMBULATORY_CARE_PROVIDER_SITE_OTHER): Payer: Medicare HMO | Admitting: Psychiatry

## 2020-07-07 ENCOUNTER — Other Ambulatory Visit: Payer: Self-pay

## 2020-07-07 DIAGNOSIS — F422 Mixed obsessional thoughts and acts: Secondary | ICD-10-CM | POA: Diagnosis not present

## 2020-07-07 DIAGNOSIS — G988 Other disorders of nervous system: Secondary | ICD-10-CM | POA: Diagnosis not present

## 2020-07-07 DIAGNOSIS — G8929 Other chronic pain: Secondary | ICD-10-CM

## 2020-07-07 DIAGNOSIS — M549 Dorsalgia, unspecified: Secondary | ICD-10-CM

## 2020-07-07 DIAGNOSIS — F331 Major depressive disorder, recurrent, moderate: Secondary | ICD-10-CM

## 2020-07-07 DIAGNOSIS — Z636 Dependent relative needing care at home: Secondary | ICD-10-CM

## 2020-07-07 DIAGNOSIS — Z638 Other specified problems related to primary support group: Secondary | ICD-10-CM

## 2020-07-07 NOTE — Progress Notes (Signed)
Psychotherapy Progress Note [facility] Adam Czar, PhD LP  Patient ID: Adam Meyers     MRN: [id number] Therapy format: Individual psychotherapy Date: 07/07/2020      Start: 3:14p     Stop: 4:04p     Time Spent: 50 min Location: In-person   Session narrative (presenting needs, interim history, self-report of stressors and symptoms, applications of prior therapy, status changes, and interventions made in session) Less torn up about family with the holidays receding.  Weary of COVID, precautions, news of shortages, tests, and his own ongoing strains with care for mother, having anxiety, taking tiring medication, coordinating with Adam Meyers about appointments and having anxiety and fatigue reactions to series of responsibilities with mother.  Suggested that he may actually have a physically expressed "allergy" to perceived stress, a la chronic fatigue.  Continuing difficulties trying to reason with Adam Meyers, who tends to complain openly and offer what seem to be nonsense objections to time.  Physical therapy back on but intermittently cancelling.  Bothered by Adam Meyers objecting to mother coming over on Sunday, on diuretic.  Feeling vaguely spited, as happens often, tempted to get testy and defend his own needs and limitations even when they aren't directly questioned.  Feels she is chipping away at the deal they have about who handles what care responsibilities, and a bit ganged up on by BIL for asserting that [name] won't be dumped on.  Reviewed messages given and taken and addressed the likelihood that he is receiving more or less veiled accusations of sloughing off, or just being puny.  Framed key to constructive dealings being to make sure he is not automatic in defending himself and see if he can get criticisms out in the open first.  Advised restrain the urge to justify and explain his own limitations until he has first checked perceptions, e.g., "Sounds like you're trying to tell me  I'm dumping on you or being puny -- is that what you mean?"  If confirmed, f/u with inquiry, e.g., "So what makes it seem that way?", then verify openness, e.g., "Are you interested in knowing why I can't ___ ?"  Advantages that the accuser/complainer had to be clear, rather than passive-aggressive, it buys time to think further than seem to react snarky, it spares wasted effort saying things would go unheard, it models a more constructive tone, and in the worst case shows a strength that won't be run over.  Like the idea, reinforced in session.  Especially useful when responding to a loaded (judgment suggested) question.  Re. medication, has question about splitting Luvox dose, but being tired, and tired of pills, and has been leaving off the morning dose, actually, especially since he typically sleeps in in the morning.  Dreads planned change to Prozac based on his experience of deeply unsettling withdrawal/transition from Paxil to Luvox a few years ago.  Assured that Paxil withdrawal is among the worst, historically, and secondhand word from psychiatry is that the fade from Luvox to Prozac is typically gentler, just need to be clear with Dr. Jennelle Meyers what the actual dosing pattern is before the start and what his concerns are.  Therapeutic modalities: Cognitive Behavioral Therapy, Solution-Oriented/Positive Psychology, Ego-Supportive and Psycho-education/Bibliotherapy  Mental Status/Observations:  Appearance:   Casual     Behavior:  Appropriate  Motor:  tense  Speech/Language:   Clear and Coherent  Affect:  Appropriate  Mood:  anxious  Thought process:  normal  Thought content:    Obsessions  Sensory/Perceptual disturbances:  WNL  Orientation:  Fully oriented  Attention:  Good    Concentration:  Good  Memory:  WNL  Insight:    Good  Judgment:   Good  Impulse Control:  Good   Risk Assessment: Danger to Self: No Self-injurious Behavior: No Danger to Others: No Physical Aggression /  Violence: No Duty to Warn: No Access to Firearms a concern: No  Assessment of progress:  stabilized  Diagnosis:   ICD-10-CM   1. Major depressive disorder, recurrent episode, moderate (HCC)  F33.1   2. Mixed obsessional thoughts and acts  F42.2   3. Relationship problem with family member  Z63.8   4. Caregiver stress  Z63.6    Plan:  . Try out communication/conflict strategy with sister and/or brother-in-law, mentally practice beforehand for fluency . Practice trust of medication recommendation, be ready to ask clarifying questions with Dr. Jennelle Meyers PRN . Maintain health habits and involvement in PT as able . Continue self-directed E/RP ad lib for phobic/compulsive issues . Continued encouragement to diversify nutrition . Other recommendations/advice as may be noted above . Continue to utilize previously learned skills ad lib . Maintain medication as prescribed and work faithfully with relevant prescriber(s) if any changes are desired or seem indicated . Call the clinic on-call service, present to ER, or call 911 if any life-threatening psychiatric crisis Return in about 2 weeks (around 07/21/2020) for session(s) already scheduled. . Already scheduled visit in this office 07/14/2020.  [name], PhD Adam Czar, PhD LP Clinical Psychologist, [facility] [address] [place], [place] [address] [phone]

## 2020-07-13 DIAGNOSIS — M545 Low back pain, unspecified: Secondary | ICD-10-CM | POA: Diagnosis not present

## 2020-07-13 DIAGNOSIS — M2560 Stiffness of unspecified joint, not elsewhere classified: Secondary | ICD-10-CM | POA: Diagnosis not present

## 2020-07-13 DIAGNOSIS — M6281 Muscle weakness (generalized): Secondary | ICD-10-CM | POA: Diagnosis not present

## 2020-07-13 DIAGNOSIS — M542 Cervicalgia: Secondary | ICD-10-CM | POA: Diagnosis not present

## 2020-07-13 DIAGNOSIS — R262 Difficulty in walking, not elsewhere classified: Secondary | ICD-10-CM | POA: Diagnosis not present

## 2020-07-13 DIAGNOSIS — S39012D Strain of muscle, fascia and tendon of lower back, subsequent encounter: Secondary | ICD-10-CM | POA: Diagnosis not present

## 2020-07-13 DIAGNOSIS — R519 Headache, unspecified: Secondary | ICD-10-CM | POA: Diagnosis not present

## 2020-07-14 ENCOUNTER — Encounter: Payer: Self-pay | Admitting: Psychiatry

## 2020-07-14 ENCOUNTER — Ambulatory Visit (INDEPENDENT_AMBULATORY_CARE_PROVIDER_SITE_OTHER): Payer: Medicare HMO | Admitting: Psychiatry

## 2020-07-14 ENCOUNTER — Other Ambulatory Visit: Payer: Self-pay

## 2020-07-14 DIAGNOSIS — R5382 Chronic fatigue, unspecified: Secondary | ICD-10-CM

## 2020-07-14 DIAGNOSIS — Z636 Dependent relative needing care at home: Secondary | ICD-10-CM

## 2020-07-14 DIAGNOSIS — M26609 Unspecified temporomandibular joint disorder, unspecified side: Secondary | ICD-10-CM | POA: Diagnosis not present

## 2020-07-14 DIAGNOSIS — G9332 Myalgic encephalomyelitis/chronic fatigue syndrome: Secondary | ICD-10-CM

## 2020-07-14 DIAGNOSIS — F331 Major depressive disorder, recurrent, moderate: Secondary | ICD-10-CM | POA: Diagnosis not present

## 2020-07-14 DIAGNOSIS — F422 Mixed obsessional thoughts and acts: Secondary | ICD-10-CM | POA: Diagnosis not present

## 2020-07-14 DIAGNOSIS — F3341 Major depressive disorder, recurrent, in partial remission: Secondary | ICD-10-CM | POA: Diagnosis not present

## 2020-07-14 DIAGNOSIS — F4010 Social phobia, unspecified: Secondary | ICD-10-CM | POA: Diagnosis not present

## 2020-07-14 MED ORDER — FLUVOXAMINE MALEATE 100 MG PO TABS: ORAL_TABLET | ORAL | 1 refills | Status: DC

## 2020-07-14 MED ORDER — DIAZEPAM 5 MG PO TABS: ORAL_TABLET | ORAL | 2 refills | Status: DC

## 2020-07-14 NOTE — Progress Notes (Signed)
Adam Meyers [id number] 11/14/1960 60 y.o.   Subjective:   Patient ID:  Adam Meyers is a 60 y.o. (DOB 1961-03-07) male.  Chief Complaint:  Chief Complaint  Patient presents with  . Follow-up  . Depression  . Sleeping Problem  . Anxiety    Depression        Associated symptoms include fatigue and headaches.  Adam Meyers presents for follow-up of OCD and anxiety.  seen November 2020.  No meds were changed at the time.  He was taking Luvox 150 mg a day and diazepam 5 mg at night for sleep  08/13/19 appt with the following noted: Worrying over pending loss of power bc 38 yo mother with him.  Her dementia is progressing.  Has daily help now. Stress living with mother.  Hosp for a month 2020 with cardiac and stroke problems.  Minor stroke.  Caretaking mother  Better TMJ with diazepam at HS.  Fear of the dentist now.  Fear of other doctors also. Was taking diazepam 5 hs for sleep.  Then did ok without it and stopped it. Half the time forgets the am dose of fluvoxamine.  Asks what that might do.  Not sure if anxiety or OCD is worse.  Sometimes lethargic.  Able to get out of the house some now with help.  Some germ issues at the grocery store periodically if everything doesn't go just right. Plan: be CW med.  No changes  02/11/20 appt with the following noted: Only taking fluvoxamine 100 mg daily. Vaccinated July 8, but "pushed every button that I got." for OCD.  Hard to make decisions and second guessing himself.   Son Adam Meyers off to [place] and pt got stomach bug a couple of days later and now fatigued.  That also triggered anxiety.  Being in a hotel room triggered him also.   Plan rec fluvoxamine 150 for better control  07/14/2020 appt noted: Never followed through on increasing fluvoxamine to 150 mg daily.  Doesn't like meds.   HA more frequent lately and today. Caretaking wearing him down.  Avoids dentist bc OCD.  Chronic anxiety.   Patient reports stable mood and denies  depressed or irritable moods.  Patient denies difficulty with sleep initiation or maintenance. 10-11 hours typical lately.  Likes to sleep a lot and don't like to talk a lot.  Meyers energy. Brain won't work with less. Strange dreams.  Denies appetite disturbance.  Patient reports that energy and motivation have been good.  Patient denies any difficulty with concentration.  Patient denies any suicidal ideation.  Sensitive to loud noises.  Never bothered me before my meltdown.  Past Psychiatric Medication Trials: Paxil Sex SE  Afraid of Zoloft bc mother hallucinated on it. Luvox 200 cog SE  pindolol, propranolol, diazepam  Review of Systems:  Review of Systems  Constitutional: Positive for fatigue.  HENT: Negative for postnasal drip, rhinorrhea and sneezing.        Jaw pain  Musculoskeletal: Positive for arthralgias, gait problem and neck pain.  Neurological: Positive for headaches. Negative for tremors and weakness.  Psychiatric/Behavioral: Positive for depression. The patient is nervous/anxious.     Medications: I have reviewed the patient's current medications.  Current Outpatient Medications  Medication Sig Dispense Refill  . Cholecalciferol (VITAMIN D) 50 MCG (2000 UT) CAPS Take by mouth.    . famotidine (PEPCID) 20 MG tablet Take 20 mg by mouth daily as needed for heartburn or indigestion.    Marland Kitchen ibuprofen (ADVIL)  200 MG tablet Take 600 mg by mouth every 8 (eight) hours as needed for moderate pain.     . diazepam (VALIUM) 5 MG tablet TAKE 1 TABLET BY MOUTH THREE TIMES A DAY AS NEEDED FOR ANXIETY 90 tablet 2  . fluvoxaMINE (LUVOX) 100 MG tablet Take 1/2 tablet (50 mg) by mouth every morning and 1 tablet (100 mg) at bedtime 135 tablet 1   No current facility-administered medications for this visit.    Medication Side Effects: None  Allergies:  Allergies  Allergen Reactions  . Oxycodone-Acetaminophen Itching  . Seasonal Ic [Cholestatin]     Drainage,itchy eyes  . Gabapentin  Anxiety  . Percocet [Oxycodone-Acetaminophen] Itching and Anxiety    Past Medical History:  Diagnosis Date  . Compressed cervical disc   . Cough   . Depression   . Hyperlipidemia   . Nasal congestion   . Social anxiety disorder 04/10/2018    Family History  Problem Relation Age of Onset  . Cancer Maternal Grandmother        melanoma    Social History   Socioeconomic History  . Marital status: Married    Spouse name: Not on file  . Number of children: Not on file  . Years of education: Not on file  . Highest education level: Not on file  Occupational History  . Not on file  Tobacco Use  . Smoking status: Never Smoker  . Smokeless tobacco: Never Used  Substance and Sexual Activity  . Alcohol use: No  . Drug use: No  . Sexual activity: Not on file  Other Topics Concern  . Not on file  Social History Narrative  . Not on file   Social Determinants of Health   Financial Resource Strain: Not on file  Food Insecurity: Not on file  Transportation Needs: Not on file  Physical Activity: Not on file  Stress: Not on file  Social Connections: Not on file  Intimate Partner Violence: Not on file    Past Medical History, Surgical history, Social history, and Family history were reviewed and updated as appropriate.   Please see review of systems for further details on the patient's review from today.   Objective:   Physical Exam:  There were no vitals taken for this visit.  Physical Exam Constitutional:      General: He is not in acute distress. Musculoskeletal:        General: No deformity.  Neurological:     Mental Status: He is alert and oriented to person, place, and time.     Cranial Nerves: No dysarthria.     Coordination: Coordination normal.  Psychiatric:        Attention and Perception: Attention and perception normal. He does not perceive auditory or visual hallucinations.        Mood and Affect: Mood is anxious. Mood is not depressed. Affect is not  labile, blunt, angry or inappropriate.        Speech: Speech normal.        Behavior: Behavior normal. Behavior is cooperative.        Thought Content: Thought content normal. Thought content is not paranoid or delusional. Thought content does not include homicidal or suicidal ideation. Thought content does not include homicidal or suicidal plan.        Cognition and Memory: Cognition and memory normal.        Judgment: Judgment normal.     Comments: Residual chronic OCD chronically.   It still interferes with  function and QOL markedly. Chronically fearful of med change. Fair insight and judgment Talkative without pressure     Lab Review:  No results found for: NA, K, CL, CO2, GLUCOSE, BUN, CREATININE, CALCIUM, PROT, ALBUMIN, AST, ALT, ALKPHOS, BILITOT, GFRNONAA, GFRAA  No results found for: WBC, RBC, HGB, HCT, PLT, MCV, MCH, MCHC, RDW, LYMPHSABS, MONOABS, EOSABS, BASOSABS  No results found for: POCLITH, LITHIUM   No results found for: PHENYTOIN, PHENOBARB, VALPROATE, CBMZ   .res Assessment: Plan:    Mixed obsessional thoughts and acts - Plan: diazepam (VALIUM) 5 MG tablet, fluvoxaMINE (LUVOX) 100 MG tablet  Social anxiety disorder - Plan: diazepam (VALIUM) 5 MG tablet, fluvoxaMINE (LUVOX) 100 MG tablet  Major depressive disorder, recurrent episode, moderate (HCC)  Chronic fatigue syndrome  TMJ disease - Plan: diazepam (VALIUM) 5 MG tablet  Caregiver stress - Plan: diazepam (VALIUM) 5 MG tablet  Major depressive disorder, recurrent episode, in partial remission (HCC) - Plan: fluvoxaMINE (LUVOX) 100 MG tablet   Greater than 50% 30 min of face to face time with patient was spent on counseling and coordination of care. We discussed Adam Meyers has a long history of OCD and a history of depression as well and first came to our practice and April 2014.  He continues in therapy with Dr. Amado Coe.  While he has made a lot of progress and is much less distressed by his OCD.  He remains  disabled because of time requirements of the OCD and he is easily triggered into compulsive rituals with high levels of anxiety.  With his limited life demands and relative isolation he manages acceptably well.  He does not want to take take more SSRI medication because of sexual side effects are worse at higher dosages.  Supportive therapy dealing with stressors of care taking mother and how that plays into fears of med changes.    Last med change was very difficult for him so he doesn't want to pursue it.    Continue meds.  Benefit from meds. Rec Cont it longterm bc high relapse risk.  Emphasized need for consistency.   Answered his questions about need for it longterm medication given severity of his sx historically.    Rec increase fluvoxamine but he complains grogginess and sex SE at higher dosage.  Be consistent with 150 mg daily.  He probably will not. Extensive discussion of dx OCD and will likely need meds for OCD for the rest of his life DT disability of OCD.  Option change SSRI to Prozac to reduce fatigue.  He's afraid of Zoloft bc mother had a bad time with it.  He's afraid of switching meds.  Increase diazepam 5 mg HS has helped TMJ.  Disc SE.  FU 6 mos    Meredith Staggers, MD, DFAPA   Please see After Visit Summary for patient specific instructions.  Future Appointments  Date Time Provider Department Center  07/28/2020  3:00 PM [name], PhD [facility] None  08/18/2020  3:00 PM [name], PhD [facility] None  09/09/2020  3:00 PM [name], PhD [facility] None    No orders of the defined types were placed in this encounter.     -------------------------------

## 2020-07-20 DIAGNOSIS — R262 Difficulty in walking, not elsewhere classified: Secondary | ICD-10-CM | POA: Diagnosis not present

## 2020-07-20 DIAGNOSIS — M545 Low back pain, unspecified: Secondary | ICD-10-CM | POA: Diagnosis not present

## 2020-07-20 DIAGNOSIS — R519 Headache, unspecified: Secondary | ICD-10-CM | POA: Diagnosis not present

## 2020-07-20 DIAGNOSIS — S39012D Strain of muscle, fascia and tendon of lower back, subsequent encounter: Secondary | ICD-10-CM | POA: Diagnosis not present

## 2020-07-20 DIAGNOSIS — M6281 Muscle weakness (generalized): Secondary | ICD-10-CM | POA: Diagnosis not present

## 2020-07-20 DIAGNOSIS — M2560 Stiffness of unspecified joint, not elsewhere classified: Secondary | ICD-10-CM | POA: Diagnosis not present

## 2020-07-20 DIAGNOSIS — M542 Cervicalgia: Secondary | ICD-10-CM | POA: Diagnosis not present

## 2020-07-27 DIAGNOSIS — S39012D Strain of muscle, fascia and tendon of lower back, subsequent encounter: Secondary | ICD-10-CM | POA: Diagnosis not present

## 2020-07-27 DIAGNOSIS — M545 Low back pain, unspecified: Secondary | ICD-10-CM | POA: Diagnosis not present

## 2020-07-27 DIAGNOSIS — M2560 Stiffness of unspecified joint, not elsewhere classified: Secondary | ICD-10-CM | POA: Diagnosis not present

## 2020-07-27 DIAGNOSIS — R519 Headache, unspecified: Secondary | ICD-10-CM | POA: Diagnosis not present

## 2020-07-27 DIAGNOSIS — M6281 Muscle weakness (generalized): Secondary | ICD-10-CM | POA: Diagnosis not present

## 2020-07-27 DIAGNOSIS — R262 Difficulty in walking, not elsewhere classified: Secondary | ICD-10-CM | POA: Diagnosis not present

## 2020-07-27 DIAGNOSIS — M542 Cervicalgia: Secondary | ICD-10-CM | POA: Diagnosis not present

## 2020-07-28 ENCOUNTER — Ambulatory Visit (INDEPENDENT_AMBULATORY_CARE_PROVIDER_SITE_OTHER): Payer: Medicare HMO | Admitting: Psychiatry

## 2020-07-28 ENCOUNTER — Other Ambulatory Visit: Payer: Self-pay

## 2020-07-28 DIAGNOSIS — F4010 Social phobia, unspecified: Secondary | ICD-10-CM

## 2020-07-28 DIAGNOSIS — G8929 Other chronic pain: Secondary | ICD-10-CM | POA: Diagnosis not present

## 2020-07-28 DIAGNOSIS — M549 Dorsalgia, unspecified: Secondary | ICD-10-CM | POA: Diagnosis not present

## 2020-07-28 DIAGNOSIS — Z638 Other specified problems related to primary support group: Secondary | ICD-10-CM | POA: Diagnosis not present

## 2020-07-28 DIAGNOSIS — R5382 Chronic fatigue, unspecified: Secondary | ICD-10-CM

## 2020-07-28 DIAGNOSIS — E638 Other specified nutritional deficiencies: Secondary | ICD-10-CM

## 2020-07-28 DIAGNOSIS — F422 Mixed obsessional thoughts and acts: Secondary | ICD-10-CM | POA: Diagnosis not present

## 2020-07-28 DIAGNOSIS — Z636 Dependent relative needing care at home: Secondary | ICD-10-CM | POA: Diagnosis not present

## 2020-07-28 DIAGNOSIS — E639 Nutritional deficiency, unspecified: Secondary | ICD-10-CM

## 2020-07-28 DIAGNOSIS — G9332 Myalgic encephalomyelitis/chronic fatigue syndrome: Secondary | ICD-10-CM

## 2020-07-28 NOTE — Progress Notes (Signed)
Psychotherapy Progress Note [facility] [name], PhD LP  Patient ID: Adam Meyers     MRN: [id number] Therapy format: Individual psychotherapy Date: 07/28/2020      Start: 3:30p     Stop: 4:15p     Time Spent: 45 min Location: In-person   Session narrative (presenting needs, interim history, self-report of stressors and symptoms, applications of prior therapy, status changes, and interventions made in session) Stress with sister, run-ins over responsibilities with mother.  Fresh off [name] unilaterally making an appointment for 3pm today without consulting and him having to reschedule.  Says [name] figures out how to complain no matter how prepared he gets, and will hypocritically do things like run late and then tell him he should've had mom out at the road.    Reviewed constructive comebacks for negativity, advocating two questions -- mainly, "Sounds like __.  Is that what you mean?" and ""Is this [competitive, crabby, negative] the way you want to do this?"    Therapeutic modalities: Cognitive Behavioral Therapy and Solution-Oriented/Positive Psychology  Mental Status/Observations:  Appearance:   Casual     Behavior:  Appropriate  Motor:  Normal  Speech/Language:   Clear and Coherent  Affect:  Appropriate  Mood:  dysthymic  Thought process:  normal  Thought content:    WNL  Sensory/Perceptual disturbances:    WNL  Orientation:  Fully oriented  Attention:  Good    Concentration:  Good  Memory:  WNL  Insight:    Good  Judgment:   Good  Impulse Control:  Good   Risk Assessment: Danger to Self: No Self-injurious Behavior: No Danger to Others: No Physical Aggression / Violence: No Duty to Warn: No Access to Firearms a concern: No  Assessment of progress:  stabilized  Diagnosis:   ICD-10-CM   1. Social anxiety disorder  F40.10   2. Caregiver stress  Z63.6   3. Relationship problem with family member  Z63.8   4. Mixed obsessional thoughts and acts   F42.2   5. Chronic fatigue syndrome  R53.82   6. Chronic back pain, unspecified back location, unspecified back pain laterality  M54.9    G89.29   7. Nutritional problems  E63.8    Plan:  . Use perception-checking questions to draw sister's negativity out in the open and get to explicit agreements about division of responsibility -- if issues are worth the concern, they are worth addressing openly . Continue with physical therapy, efforts to diversify nutrition, and  . Other recommendations/advice as may be noted above . Continue to utilize previously learned skills ad lib . Maintain medication as prescribed and work faithfully with relevant prescriber(s) if any changes are desired or seem indicated . Call the clinic on-call service, present to ER, or call 911 if any life-threatening psychiatric crisis Return in about 3 weeks (around 08/18/2020). . Already scheduled visit in this office 08/18/2020.  [name], PhD [name], PhD LP Clinical Psychologist, [facility] [address] [place], [place] [address] [phone]

## 2020-08-05 DIAGNOSIS — R519 Headache, unspecified: Secondary | ICD-10-CM | POA: Diagnosis not present

## 2020-08-05 DIAGNOSIS — S39012D Strain of muscle, fascia and tendon of lower back, subsequent encounter: Secondary | ICD-10-CM | POA: Diagnosis not present

## 2020-08-05 DIAGNOSIS — M542 Cervicalgia: Secondary | ICD-10-CM | POA: Diagnosis not present

## 2020-08-05 DIAGNOSIS — M6281 Muscle weakness (generalized): Secondary | ICD-10-CM | POA: Diagnosis not present

## 2020-08-05 DIAGNOSIS — R262 Difficulty in walking, not elsewhere classified: Secondary | ICD-10-CM | POA: Diagnosis not present

## 2020-08-05 DIAGNOSIS — M2560 Stiffness of unspecified joint, not elsewhere classified: Secondary | ICD-10-CM | POA: Diagnosis not present

## 2020-08-05 DIAGNOSIS — M545 Low back pain, unspecified: Secondary | ICD-10-CM | POA: Diagnosis not present

## 2020-08-10 DIAGNOSIS — M545 Low back pain, unspecified: Secondary | ICD-10-CM | POA: Diagnosis not present

## 2020-08-10 DIAGNOSIS — M2560 Stiffness of unspecified joint, not elsewhere classified: Secondary | ICD-10-CM | POA: Diagnosis not present

## 2020-08-10 DIAGNOSIS — M542 Cervicalgia: Secondary | ICD-10-CM | POA: Diagnosis not present

## 2020-08-10 DIAGNOSIS — S39012D Strain of muscle, fascia and tendon of lower back, subsequent encounter: Secondary | ICD-10-CM | POA: Diagnosis not present

## 2020-08-10 DIAGNOSIS — R519 Headache, unspecified: Secondary | ICD-10-CM | POA: Diagnosis not present

## 2020-08-10 DIAGNOSIS — R262 Difficulty in walking, not elsewhere classified: Secondary | ICD-10-CM | POA: Diagnosis not present

## 2020-08-10 DIAGNOSIS — M6281 Muscle weakness (generalized): Secondary | ICD-10-CM | POA: Diagnosis not present

## 2020-08-18 ENCOUNTER — Ambulatory Visit (INDEPENDENT_AMBULATORY_CARE_PROVIDER_SITE_OTHER): Payer: Medicare HMO | Admitting: Psychiatry

## 2020-08-18 ENCOUNTER — Other Ambulatory Visit: Payer: Self-pay

## 2020-08-18 DIAGNOSIS — F422 Mixed obsessional thoughts and acts: Secondary | ICD-10-CM

## 2020-08-18 DIAGNOSIS — G479 Sleep disorder, unspecified: Secondary | ICD-10-CM

## 2020-08-18 DIAGNOSIS — E638 Other specified nutritional deficiencies: Secondary | ICD-10-CM | POA: Diagnosis not present

## 2020-08-18 DIAGNOSIS — G8929 Other chronic pain: Secondary | ICD-10-CM | POA: Diagnosis not present

## 2020-08-18 DIAGNOSIS — R5382 Chronic fatigue, unspecified: Secondary | ICD-10-CM

## 2020-08-18 DIAGNOSIS — M549 Dorsalgia, unspecified: Secondary | ICD-10-CM

## 2020-08-18 DIAGNOSIS — F331 Major depressive disorder, recurrent, moderate: Secondary | ICD-10-CM

## 2020-08-18 DIAGNOSIS — Z636 Dependent relative needing care at home: Secondary | ICD-10-CM | POA: Diagnosis not present

## 2020-08-18 DIAGNOSIS — G9332 Myalgic encephalomyelitis/chronic fatigue syndrome: Secondary | ICD-10-CM

## 2020-08-18 DIAGNOSIS — Z638 Other specified problems related to primary support group: Secondary | ICD-10-CM

## 2020-08-18 DIAGNOSIS — E639 Nutritional deficiency, unspecified: Secondary | ICD-10-CM

## 2020-08-18 NOTE — Progress Notes (Signed)
Psychotherapy Progress Note [facility] Adam Czar, PhD LP  Patient ID: Adam Meyers     MRN: [id number] Therapy format: Individual psychotherapy Date: 08/18/2020      Start: 3:22p     Stop: 4:10p     Time Spent: 48 min Location: In-person   Session narrative (presenting needs, interim history, self-report of stressors and symptoms, applications of prior therapy, status changes, and interventions made in session) Got some relief today when home aide took mother out.  Craving having his own space more, feeling more and more wearied.  Dealing with near-daily HA for the past 6 months, attributed to lack of full rest.  Sleep time varies 10p-2a.  Morning routine to unlock door for home care worker sometime before 9:30a, go back to bed.  Mother sleeps in till 11am.  Last week almost had to call ambulance.    Gets wearied being on call emotionally for potential needs, hazards, people coming in, etc.  Still has to take digs now and then from sister, and martyring (e.g., about carrying bottled water while mentioning she's wearing a back brace) and condescension (e.g., uninvited advice about disposing of empty pill bottles).  Challenged to approach [name]'s complaints assertively (e.g., "Please don't add sarcasm to this") and constructively (offer solutions, like getting a filtering pitcher instead of bottled water) instead of stuffing or sniping.  Therapeutic modalities: Cognitive Behavioral Therapy, Solution-Oriented/Positive Psychology and Faith-sensitive  Mental Status/Observations:  Appearance:   Casual     Behavior:  Appropriate  Motor:  Normal  Speech/Language:   Clear and Coherent  Affect:  Appropriate  Mood:  dysthymic  Thought process:  normal  Thought content:    WNL  Sensory/Perceptual disturbances:    WNL  Orientation:  Fully oriented  Attention:  Good    Concentration:  Good  Memory:  WNL  Insight:    Good  Judgment:   Good  Impulse Control:  Good    Risk Assessment: Danger to Self: No Self-injurious Behavior: No Danger to Others: No Physical Aggression / Violence: No Duty to Warn: No Access to Firearms a concern: No  Assessment of progress:  stabilized  Diagnosis:   ICD-10-CM   1. Major depressive disorder, recurrent episode, moderate (HCC)  F33.1   2. Caregiver stress  Z63.6   3. Relationship problem with family member  Z63.8   4. Mixed obsessional thoughts and acts  F42.2   5. Chronic fatigue syndrome  R53.82   6. Chronic back pain, unspecified back location, unspecified back pain laterality  M54.9    G89.29   7. Nutritional problems  E63.8   8. Sleep disturbance  G47.9    Plan:  . Try to regulate sleep times for better physiological and emotional benefit  . Approach [name]'s complaints assertively (e.g., "Please don't add sarcasm to this") and constructively (offer solutions, like getting a filtering pitcher instead of bottled water) instead of stuffing or sniping. . Continue effort to improve back pain and nutritional diversity . Other recommendations/advice as may be noted above . Continue to utilize previously learned skills ad lib . Maintain medication as prescribed and work faithfully with relevant prescriber(s) if any changes are desired or seem indicated . Call the clinic on-call service, present to ER, or call 911 if any life-threatening psychiatric crisis Return in about 3 weeks (around 09/08/2020). . Already scheduled visit in this office 09/09/2020.  Adam Fries, PhD Adam Czar, PhD LP Clinical Psychologist, [facility] [phone]  [address] [place], [place] [address] [phone]

## 2020-08-24 DIAGNOSIS — M545 Low back pain, unspecified: Secondary | ICD-10-CM | POA: Diagnosis not present

## 2020-08-24 DIAGNOSIS — M6281 Muscle weakness (generalized): Secondary | ICD-10-CM | POA: Diagnosis not present

## 2020-08-24 DIAGNOSIS — M542 Cervicalgia: Secondary | ICD-10-CM | POA: Diagnosis not present

## 2020-08-24 DIAGNOSIS — R262 Difficulty in walking, not elsewhere classified: Secondary | ICD-10-CM | POA: Diagnosis not present

## 2020-08-24 DIAGNOSIS — R519 Headache, unspecified: Secondary | ICD-10-CM | POA: Diagnosis not present

## 2020-08-24 DIAGNOSIS — S39012D Strain of muscle, fascia and tendon of lower back, subsequent encounter: Secondary | ICD-10-CM | POA: Diagnosis not present

## 2020-08-24 DIAGNOSIS — M2560 Stiffness of unspecified joint, not elsewhere classified: Secondary | ICD-10-CM | POA: Diagnosis not present

## 2020-09-07 DIAGNOSIS — M542 Cervicalgia: Secondary | ICD-10-CM | POA: Diagnosis not present

## 2020-09-07 DIAGNOSIS — R519 Headache, unspecified: Secondary | ICD-10-CM | POA: Diagnosis not present

## 2020-09-07 DIAGNOSIS — M6281 Muscle weakness (generalized): Secondary | ICD-10-CM | POA: Diagnosis not present

## 2020-09-07 DIAGNOSIS — R262 Difficulty in walking, not elsewhere classified: Secondary | ICD-10-CM | POA: Diagnosis not present

## 2020-09-07 DIAGNOSIS — M545 Low back pain, unspecified: Secondary | ICD-10-CM | POA: Diagnosis not present

## 2020-09-07 DIAGNOSIS — S39012D Strain of muscle, fascia and tendon of lower back, subsequent encounter: Secondary | ICD-10-CM | POA: Diagnosis not present

## 2020-09-07 DIAGNOSIS — M2560 Stiffness of unspecified joint, not elsewhere classified: Secondary | ICD-10-CM | POA: Diagnosis not present

## 2020-09-09 ENCOUNTER — Other Ambulatory Visit: Payer: Self-pay

## 2020-09-09 ENCOUNTER — Ambulatory Visit (INDEPENDENT_AMBULATORY_CARE_PROVIDER_SITE_OTHER): Payer: Medicare HMO | Admitting: Psychiatry

## 2020-09-09 DIAGNOSIS — F331 Major depressive disorder, recurrent, moderate: Secondary | ICD-10-CM

## 2020-09-09 DIAGNOSIS — G9332 Myalgic encephalomyelitis/chronic fatigue syndrome: Secondary | ICD-10-CM

## 2020-09-09 DIAGNOSIS — F422 Mixed obsessional thoughts and acts: Secondary | ICD-10-CM | POA: Diagnosis not present

## 2020-09-09 DIAGNOSIS — R5382 Chronic fatigue, unspecified: Secondary | ICD-10-CM | POA: Diagnosis not present

## 2020-09-09 DIAGNOSIS — Z636 Dependent relative needing care at home: Secondary | ICD-10-CM

## 2020-09-09 DIAGNOSIS — Z638 Other specified problems related to primary support group: Secondary | ICD-10-CM

## 2020-09-09 NOTE — Progress Notes (Signed)
Psychotherapy Progress Note [facility] [name], PhD LP  Patient ID: Adam Meyers     MRN: [id number] Therapy format: Individual psychotherapy Date: 09/09/2020      Start: 3:15p     Stop: 4:05p     Time Spent: 50 min Location: In-person   Session narrative (presenting needs, interim history, self-report of stressors and symptoms, applications of prior therapy, status changes, and interventions made in session) Was good to have [facility] home for half of his spring break.  Still chafing about being on call for mother's needs.  Found himself getting overwhelmed setting up a new phone made necessary by retirement of [employer] network.  Felt himself tremble even at the prospect, shopping, setting up, and having other people handle his device (OCD).  Felt shaken up, sluggish.  Interpreted as ongoing "allergy" to uncertainty, one that seems to enact physically.  Reviewed history, turns out [name] had very bothersome rages in puberty that stretched both [name]'s and [name]'s tolerance to the breaking point.   Thankfully grew out of those, but part of the history that may have led to his disabling depression and her near-simultaneous choice to leave the marriage several years ago.  Continued strife with sister [name], e.g., her criticizing an inconsequential shopping mistake (lowfat yogurt) for mother, exclaiming about how long he's been going to PT (prompted by his appointment spoiling her wishes to hand off another doctor visit to him), and her habit of throwing off on him with "Why did you.Marland KitchenMarland Kitchen?" questions whenever she is faced with an inconvenient limitation on the prospect of handing off mother duties to him.  Reviewed his handling, affirming assertive mention of "If I have to justify...", which seemed to help change the dynamic.  Confirmed a history of sometimes bitter, definitely prolonged, sibling rivalry with sister growing up, some relief during college and young adulthood, until then-wife  pointed out unfairness in treatment and how [name] put him off and put him down.  Acknowledged [name] had her own insecurities and may have been extra sensitive, but she saw something real to it, by his account.  Differentially affirmed bringing it out in the open if [name] is just harping or fighting unfairly and encouraged [name] to consider whether he has also made himself primed to look for manipulation when [name] is just trying, as anyone might, to lighten her own load or genuinely pursue fairness as she sees it.  It will still take more open, problem-solving communication to settle those things and prevent the lost energy and strife that happens when they stay on edge with each other.  Offered possible phrasing to confront and release a rivalry orientation on both their parts and recruit collaboration instead.  Therapeutic modalities: Cognitive Behavioral Therapy, Solution-Oriented/Positive Psychology, Ego-Supportive and Interpersonal  Mental Status/Observations:  Appearance:   Casual     Behavior:  Appropriate  Motor:  Normal  Speech/Language:   Clear and Coherent  Affect:  Appropriate  Mood:  anxious  Thought process:  normal  Thought content:    WNL  Sensory/Perceptual disturbances:    WNL  Orientation:  Fully oriented  Attention:  Good    Concentration:  Good  Memory:  WNL  Insight:    Good  Judgment:   Fair  Impulse Control:  Good   Risk Assessment: Danger to Self: No Self-injurious Behavior: No Danger to Others: No Physical Aggression / Violence: No Duty to Warn: No Access to Firearms a concern: No  Assessment of progress:  stabilized  Diagnosis:   ICD-10-CM   1. Chronic fatigue syndrome  R53.82   2. Major depressive disorder, recurrent episode, moderate (HCC)  F33.1   3. Caregiver stress  Z63.6   4. Relationship problem with family member  Z63.8   5. Mixed obsessional thoughts and acts  F42.2    Plan:  . Self-monitor for resentment/guardedness dealing with sister and be  ready to ask for civility and problem-solving with sister rather than flash reactions -- generally, give more what want to receive instead of guarding so much, and use tactics to reframe for collaboration rather than competition . Consider valid offers to take particular or partial responsibilities for mother that don't involve intimate care or fundamentally compete with his own physical self-care. . Continue efforts to diversify nutrition, improve jaw/neck pain and mobility, and improve physical condition Remains disabled from any occupation due to overall symptom severity . Other recommendations/advice as may be noted above . Continue to utilize previously learned skills ad lib . Maintain medication as prescribed and work faithfully with relevant prescriber(s) if any changes are desired or seem indicated . Call the clinic on-call service, present to ER, or call 911 if any life-threatening psychiatric crisis Return in about 3 weeks (around 09/30/2020). . Already scheduled visit in this office 09/30/2020.  [name], PhD [name], PhD LP Clinical Psychologist, [facility] [address] [place], [place] [address] [phone]

## 2020-09-14 DIAGNOSIS — M542 Cervicalgia: Secondary | ICD-10-CM | POA: Diagnosis not present

## 2020-09-14 DIAGNOSIS — M6281 Muscle weakness (generalized): Secondary | ICD-10-CM | POA: Diagnosis not present

## 2020-09-14 DIAGNOSIS — M545 Low back pain, unspecified: Secondary | ICD-10-CM | POA: Diagnosis not present

## 2020-09-14 DIAGNOSIS — S39012D Strain of muscle, fascia and tendon of lower back, subsequent encounter: Secondary | ICD-10-CM | POA: Diagnosis not present

## 2020-09-14 DIAGNOSIS — M2560 Stiffness of unspecified joint, not elsewhere classified: Secondary | ICD-10-CM | POA: Diagnosis not present

## 2020-09-14 DIAGNOSIS — R519 Headache, unspecified: Secondary | ICD-10-CM | POA: Diagnosis not present

## 2020-09-14 DIAGNOSIS — R262 Difficulty in walking, not elsewhere classified: Secondary | ICD-10-CM | POA: Diagnosis not present

## 2020-09-23 DIAGNOSIS — M542 Cervicalgia: Secondary | ICD-10-CM | POA: Diagnosis not present

## 2020-09-23 DIAGNOSIS — R519 Headache, unspecified: Secondary | ICD-10-CM | POA: Diagnosis not present

## 2020-09-23 DIAGNOSIS — M545 Low back pain, unspecified: Secondary | ICD-10-CM | POA: Diagnosis not present

## 2020-09-23 DIAGNOSIS — M2560 Stiffness of unspecified joint, not elsewhere classified: Secondary | ICD-10-CM | POA: Diagnosis not present

## 2020-09-23 DIAGNOSIS — R262 Difficulty in walking, not elsewhere classified: Secondary | ICD-10-CM | POA: Diagnosis not present

## 2020-09-23 DIAGNOSIS — S39012D Strain of muscle, fascia and tendon of lower back, subsequent encounter: Secondary | ICD-10-CM | POA: Diagnosis not present

## 2020-09-23 DIAGNOSIS — M6281 Muscle weakness (generalized): Secondary | ICD-10-CM | POA: Diagnosis not present

## 2020-09-28 DIAGNOSIS — R262 Difficulty in walking, not elsewhere classified: Secondary | ICD-10-CM | POA: Diagnosis not present

## 2020-09-28 DIAGNOSIS — M2560 Stiffness of unspecified joint, not elsewhere classified: Secondary | ICD-10-CM | POA: Diagnosis not present

## 2020-09-28 DIAGNOSIS — R519 Headache, unspecified: Secondary | ICD-10-CM | POA: Diagnosis not present

## 2020-09-28 DIAGNOSIS — M6281 Muscle weakness (generalized): Secondary | ICD-10-CM | POA: Diagnosis not present

## 2020-09-28 DIAGNOSIS — M542 Cervicalgia: Secondary | ICD-10-CM | POA: Diagnosis not present

## 2020-09-28 DIAGNOSIS — M545 Low back pain, unspecified: Secondary | ICD-10-CM | POA: Diagnosis not present

## 2020-09-28 DIAGNOSIS — S39012D Strain of muscle, fascia and tendon of lower back, subsequent encounter: Secondary | ICD-10-CM | POA: Diagnosis not present

## 2020-09-30 ENCOUNTER — Ambulatory Visit: Payer: Medicare HMO | Admitting: Psychiatry

## 2020-10-05 DIAGNOSIS — S39012D Strain of muscle, fascia and tendon of lower back, subsequent encounter: Secondary | ICD-10-CM | POA: Diagnosis not present

## 2020-10-05 DIAGNOSIS — M545 Low back pain, unspecified: Secondary | ICD-10-CM | POA: Diagnosis not present

## 2020-10-05 DIAGNOSIS — R519 Headache, unspecified: Secondary | ICD-10-CM | POA: Diagnosis not present

## 2020-10-05 DIAGNOSIS — M6281 Muscle weakness (generalized): Secondary | ICD-10-CM | POA: Diagnosis not present

## 2020-10-05 DIAGNOSIS — M2560 Stiffness of unspecified joint, not elsewhere classified: Secondary | ICD-10-CM | POA: Diagnosis not present

## 2020-10-05 DIAGNOSIS — R262 Difficulty in walking, not elsewhere classified: Secondary | ICD-10-CM | POA: Diagnosis not present

## 2020-10-05 DIAGNOSIS — M542 Cervicalgia: Secondary | ICD-10-CM | POA: Diagnosis not present

## 2020-10-13 DIAGNOSIS — M545 Low back pain, unspecified: Secondary | ICD-10-CM | POA: Diagnosis not present

## 2020-10-13 DIAGNOSIS — R262 Difficulty in walking, not elsewhere classified: Secondary | ICD-10-CM | POA: Diagnosis not present

## 2020-10-13 DIAGNOSIS — R519 Headache, unspecified: Secondary | ICD-10-CM | POA: Diagnosis not present

## 2020-10-13 DIAGNOSIS — M6281 Muscle weakness (generalized): Secondary | ICD-10-CM | POA: Diagnosis not present

## 2020-10-13 DIAGNOSIS — M2560 Stiffness of unspecified joint, not elsewhere classified: Secondary | ICD-10-CM | POA: Diagnosis not present

## 2020-10-13 DIAGNOSIS — M542 Cervicalgia: Secondary | ICD-10-CM | POA: Diagnosis not present

## 2020-10-13 DIAGNOSIS — S39012D Strain of muscle, fascia and tendon of lower back, subsequent encounter: Secondary | ICD-10-CM | POA: Diagnosis not present

## 2020-10-19 DIAGNOSIS — M542 Cervicalgia: Secondary | ICD-10-CM | POA: Diagnosis not present

## 2020-10-19 DIAGNOSIS — S39012D Strain of muscle, fascia and tendon of lower back, subsequent encounter: Secondary | ICD-10-CM | POA: Diagnosis not present

## 2020-10-19 DIAGNOSIS — R519 Headache, unspecified: Secondary | ICD-10-CM | POA: Diagnosis not present

## 2020-10-19 DIAGNOSIS — M545 Low back pain, unspecified: Secondary | ICD-10-CM | POA: Diagnosis not present

## 2020-10-19 DIAGNOSIS — R262 Difficulty in walking, not elsewhere classified: Secondary | ICD-10-CM | POA: Diagnosis not present

## 2020-10-19 DIAGNOSIS — M6281 Muscle weakness (generalized): Secondary | ICD-10-CM | POA: Diagnosis not present

## 2020-10-19 DIAGNOSIS — M2560 Stiffness of unspecified joint, not elsewhere classified: Secondary | ICD-10-CM | POA: Diagnosis not present

## 2020-10-20 ENCOUNTER — Other Ambulatory Visit: Payer: Self-pay

## 2020-10-20 ENCOUNTER — Ambulatory Visit (INDEPENDENT_AMBULATORY_CARE_PROVIDER_SITE_OTHER): Payer: Medicare HMO | Admitting: Psychiatry

## 2020-10-20 DIAGNOSIS — F331 Major depressive disorder, recurrent, moderate: Secondary | ICD-10-CM | POA: Diagnosis not present

## 2020-10-20 DIAGNOSIS — G9332 Myalgic encephalomyelitis/chronic fatigue syndrome: Secondary | ICD-10-CM

## 2020-10-20 DIAGNOSIS — Z636 Dependent relative needing care at home: Secondary | ICD-10-CM | POA: Diagnosis not present

## 2020-10-20 DIAGNOSIS — Z638 Other specified problems related to primary support group: Secondary | ICD-10-CM | POA: Diagnosis not present

## 2020-10-20 DIAGNOSIS — R5382 Chronic fatigue, unspecified: Secondary | ICD-10-CM

## 2020-10-20 DIAGNOSIS — F4010 Social phobia, unspecified: Secondary | ICD-10-CM

## 2020-10-20 DIAGNOSIS — F422 Mixed obsessional thoughts and acts: Secondary | ICD-10-CM

## 2020-10-20 NOTE — Progress Notes (Signed)
Psychotherapy Progress Note [facility] [name], PhD LP  Patient ID: Adam Meyers     MRN: [id number] Therapy format: Individual psychotherapy Date: 10/20/2020      Start: 3:10p     Stop: 4:00p     Time Spent: 50 min Location: In-person   Session narrative (presenting needs, interim history, self-report of stressors and symptoms, applications of prior therapy, status changes, and interventions made in session) Enjoyed a dinner out, first in a long time, for niece's birthday.  Had to wash hands once after a waitress touched hand, but nothing urgent or repetitive.  Working relationship victory with sister [name] -- Odis Luster morning asserted himself better when [name] questioned what he has to do and mom's schedule.  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  Discussion with [name], concerning his much-dreaded upcoming colonoscopy, she spontaneously offer to be his standby for it, and asked if he'd had one before.  Notable for her not remembering how he became paralyzed due to OCD at the colonoscopy facility last time and how that became a convincer for him to come to treatment (albeit too late for her to hold back from acting to separate from him).  Finds it telling, wonders what else she doesn't remember from that time, which plausibly combined her own personal issues, menopause, and his own breakdown into an irrepressible need to escape, and demonize if necessary.  Multiple challenges now to summon his courage for health care procedures -- colonoscopy, delayed dental work with possibility of root canal, replacing a retired primary care provider, getting a blood test.  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  Therapeutic modalities: Cognitive Behavioral Therapy, Solution-Oriented/Positive Psychology, and Ego-Supportive  Mental Status/Observations:  Appearance:   Casual     Behavior:  Appropriate  Motor:  Normal  Speech/Language:   Clear and Coherent  Affect:  Appropriate  Mood:  anxious and dysthymic  Thought process:  normal  Thought content:    Obsessions  Sensory/Perceptual disturbances:    WNL  Orientation:  Fully oriented  Attention:  Good    Concentration:  Good  Memory:  WNL  Insight:    Good  Judgment:   Good  Impulse Control:  Fair   Risk Assessment: Danger to Self: No Self-injurious Behavior: No Danger to Others: No Physical Aggression / Violence: No Duty to Warn: No Access to Firearms a concern: No  Assessment of progress:  stabilized  Diagnosis:   ICD-10-CM   1. 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 Major depressive disorder, recurrent episode, moderate (HCC)  F33.1     3. Caregiver stress  Z63.6     4. Relationship problem with family member  Z63.8     5. Social anxiety disorder  F40.10     6. Chronic fatigue syndrome  R53.82      Plan:  Continue pressing the limits of comfortable social engagement  Continue using health care procedures as ad lib E/RP for obsessive fears of contamination and damage to his person Continue trying to be evenhanded with  [name] -- assertive enough to differ, respectful enough not to feed resentment either way Other recommendations/advice as may be noted above Continue to utilize previously learned skills ad lib Maintain medication as prescribed and work faithfully with relevant prescriber(s) if any changes are desired or seem indicated Call the clinic on-call service, present to ER, or call 911 if any life-threatening psychiatric crisis Return in about 3 weeks (around  11/10/2020). Already scheduled visit in this office 11/11/2020.  [name], PhD [name], PhD LP Clinical Psychologist, [facility] [address] [place], [place] [address] [phone]

## 2020-10-22 DIAGNOSIS — E782 Mixed hyperlipidemia: Secondary | ICD-10-CM | POA: Diagnosis not present

## 2020-10-22 DIAGNOSIS — R899 Unspecified abnormal finding in specimens from other organs, systems and tissues: Secondary | ICD-10-CM | POA: Diagnosis not present

## 2020-10-28 DIAGNOSIS — M2560 Stiffness of unspecified joint, not elsewhere classified: Secondary | ICD-10-CM | POA: Diagnosis not present

## 2020-10-28 DIAGNOSIS — M6281 Muscle weakness (generalized): Secondary | ICD-10-CM | POA: Diagnosis not present

## 2020-10-28 DIAGNOSIS — S39012D Strain of muscle, fascia and tendon of lower back, subsequent encounter: Secondary | ICD-10-CM | POA: Diagnosis not present

## 2020-10-28 DIAGNOSIS — M545 Low back pain, unspecified: Secondary | ICD-10-CM | POA: Diagnosis not present

## 2020-10-28 DIAGNOSIS — R262 Difficulty in walking, not elsewhere classified: Secondary | ICD-10-CM | POA: Diagnosis not present

## 2020-10-28 DIAGNOSIS — R519 Headache, unspecified: Secondary | ICD-10-CM | POA: Diagnosis not present

## 2020-10-28 DIAGNOSIS — M542 Cervicalgia: Secondary | ICD-10-CM | POA: Diagnosis not present

## 2020-11-02 DIAGNOSIS — M542 Cervicalgia: Secondary | ICD-10-CM | POA: Diagnosis not present

## 2020-11-02 DIAGNOSIS — M545 Low back pain, unspecified: Secondary | ICD-10-CM | POA: Diagnosis not present

## 2020-11-02 DIAGNOSIS — R519 Headache, unspecified: Secondary | ICD-10-CM | POA: Diagnosis not present

## 2020-11-02 DIAGNOSIS — S39012D Strain of muscle, fascia and tendon of lower back, subsequent encounter: Secondary | ICD-10-CM | POA: Diagnosis not present

## 2020-11-02 DIAGNOSIS — M6281 Muscle weakness (generalized): Secondary | ICD-10-CM | POA: Diagnosis not present

## 2020-11-02 DIAGNOSIS — R262 Difficulty in walking, not elsewhere classified: Secondary | ICD-10-CM | POA: Diagnosis not present

## 2020-11-02 DIAGNOSIS — M2560 Stiffness of unspecified joint, not elsewhere classified: Secondary | ICD-10-CM | POA: Diagnosis not present

## 2020-11-09 DIAGNOSIS — M6281 Muscle weakness (generalized): Secondary | ICD-10-CM | POA: Diagnosis not present

## 2020-11-09 DIAGNOSIS — R262 Difficulty in walking, not elsewhere classified: Secondary | ICD-10-CM | POA: Diagnosis not present

## 2020-11-09 DIAGNOSIS — S39012D Strain of muscle, fascia and tendon of lower back, subsequent encounter: Secondary | ICD-10-CM | POA: Diagnosis not present

## 2020-11-09 DIAGNOSIS — M542 Cervicalgia: Secondary | ICD-10-CM | POA: Diagnosis not present

## 2020-11-09 DIAGNOSIS — R519 Headache, unspecified: Secondary | ICD-10-CM | POA: Diagnosis not present

## 2020-11-09 DIAGNOSIS — M2560 Stiffness of unspecified joint, not elsewhere classified: Secondary | ICD-10-CM | POA: Diagnosis not present

## 2020-11-09 DIAGNOSIS — M545 Low back pain, unspecified: Secondary | ICD-10-CM | POA: Diagnosis not present

## 2020-11-11 ENCOUNTER — Ambulatory Visit (INDEPENDENT_AMBULATORY_CARE_PROVIDER_SITE_OTHER): Payer: Medicare HMO | Admitting: Psychiatry

## 2020-11-11 ENCOUNTER — Other Ambulatory Visit: Payer: Self-pay

## 2020-11-11 DIAGNOSIS — R5382 Chronic fatigue, unspecified: Secondary | ICD-10-CM

## 2020-11-11 DIAGNOSIS — Z630 Problems in relationship with spouse or partner: Secondary | ICD-10-CM | POA: Diagnosis not present

## 2020-11-11 DIAGNOSIS — F422 Mixed obsessional thoughts and acts: Secondary | ICD-10-CM

## 2020-11-11 DIAGNOSIS — F331 Major depressive disorder, recurrent, moderate: Secondary | ICD-10-CM | POA: Diagnosis not present

## 2020-11-11 DIAGNOSIS — F40232 Fear of other medical care: Secondary | ICD-10-CM

## 2020-11-11 DIAGNOSIS — Z636 Dependent relative needing care at home: Secondary | ICD-10-CM

## 2020-11-11 DIAGNOSIS — G9332 Myalgic encephalomyelitis/chronic fatigue syndrome: Secondary | ICD-10-CM

## 2020-11-11 NOTE — Progress Notes (Signed)
Psychotherapy Progress Note [facility] 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  Disappointed, but has a clear backup plan.  Made the dean's list, first time ever having GPA > 3.0 for two semesters. He is now in [place], working [employer].  Preoccupied with inflation these days.    Working relationship with [name] going smoothly, thought he saw hints of an inclination to reconcile, then money arose again as an issue, with [name] coming up short for her share of [name]'s school living expenses.  First time in a while she got a $200 loan from [place] w/o repaying or speaking of it.  Still seems clear that she was in an altered, suspected depressed, state years ago when she broke with him, but that she did have preexisting problems with concern about fitting in with his family and with taking responsibility for her spending.  Re. the debt, and ongoing assertiveness challenge, he has been able to speak up and inquire after the money.  Aide service with mother has been a bit spotty lately, required some stronger assertiveness to address.  Orienting a new caregiver is very tense for him.  3 years now since F's death.  Can still be wary sometimes with caregivers since the first one (private, not agency) stole jewelry.  Qualified for DSS support to cover 4 nights a week after 1 year plus on the wait list.  Awaiting word from the [place] about further benefits may be attached to F's war service.    Contamination fears still potent issue at medical visits, but got the blood test done.  Did see through a  physical in November.  Deferred colonoscopy for now, too much to abide the thought of.  Needs podiatry visit for a toenail, but both most petrifying and most needed for him to go to the dentist.  Root canal strongly suspected necessary, makes him anxious just thinking about it.  Discussed and reframed in session, attempting to visualize trust and success despite pain.  Therapeutic modalities: Cognitive Behavioral Therapy, Solution-Oriented/Positive Psychology, and Ego-Supportive  Mental Status/Observations:  Appearance:   Casual     Behavior:  Appropriate  Motor:  Normal  Speech/Language:   Clear and Coherent  Affect:  Appropriate  Mood:  anxious  Thought process:  normal  Thought content:    Obsessions  Sensory/Perceptual disturbances:    WNL  Orientation:  Fully oriented  Attention:  Good    Concentration:  Good  Memory:  WNL  Insight:    Good  Judgment:   Good  Impulse Control:  Fair   Risk Assessment: Danger to Self: No Self-injurious Behavior: No Danger to Others: No Physical Aggression / Violence: No Duty to Warn: No Access to Firearms a concern: No  Assessment of progress:  stabilized  Diagnosis:   ICD-10-CM   1. Major depressive disorder, recurrent episode, moderate (HCC)  F33.1     2. 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 Chronic fatigue syndrome  R53.82     4. Caregiver stress  Z63.6     5.  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 Relationship problem between partners  Z63.0      Plan:  Continue using health care procedures as ad lib E/RP for obsessive fears of contamination and damage to his person Continue working actively with caregiver organization for mother and using changes and new personnel as opportunities to practice trust and social exposure  Continue assertiveness practice with ex-wife and sister as needed to balance boundaries, and prevent both being taken advantage of and becoming overly defensive Other recommendations/advice as may be  noted above Continue to utilize previously learned skills ad lib Maintain medication as prescribed and work faithfully with relevant prescriber(s) if any changes are desired or seem indicated Call the clinic on-call service, present to ER, or call 911 if any life-threatening psychiatric crisis Return in about 3 weeks (around 12/02/2020). Already scheduled visit in this office 12/02/2020.  [name], PhD [name], PhD LP Clinical Psychologist, [facility] [address], [address] [place], [place] [address] [phone]

## 2020-11-16 DIAGNOSIS — R519 Headache, unspecified: Secondary | ICD-10-CM | POA: Diagnosis not present

## 2020-11-16 DIAGNOSIS — M2560 Stiffness of unspecified joint, not elsewhere classified: Secondary | ICD-10-CM | POA: Diagnosis not present

## 2020-11-16 DIAGNOSIS — M545 Low back pain, unspecified: Secondary | ICD-10-CM | POA: Diagnosis not present

## 2020-11-16 DIAGNOSIS — R262 Difficulty in walking, not elsewhere classified: Secondary | ICD-10-CM | POA: Diagnosis not present

## 2020-11-16 DIAGNOSIS — S39012D Strain of muscle, fascia and tendon of lower back, subsequent encounter: Secondary | ICD-10-CM | POA: Diagnosis not present

## 2020-11-16 DIAGNOSIS — M542 Cervicalgia: Secondary | ICD-10-CM | POA: Diagnosis not present

## 2020-11-16 DIAGNOSIS — M6281 Muscle weakness (generalized): Secondary | ICD-10-CM | POA: Diagnosis not present

## 2020-11-23 DIAGNOSIS — R519 Headache, unspecified: Secondary | ICD-10-CM | POA: Diagnosis not present

## 2020-11-23 DIAGNOSIS — R262 Difficulty in walking, not elsewhere classified: Secondary | ICD-10-CM | POA: Diagnosis not present

## 2020-11-23 DIAGNOSIS — M545 Low back pain, unspecified: Secondary | ICD-10-CM | POA: Diagnosis not present

## 2020-11-23 DIAGNOSIS — M6281 Muscle weakness (generalized): Secondary | ICD-10-CM | POA: Diagnosis not present

## 2020-11-23 DIAGNOSIS — M2560 Stiffness of unspecified joint, not elsewhere classified: Secondary | ICD-10-CM | POA: Diagnosis not present

## 2020-11-23 DIAGNOSIS — S39012D Strain of muscle, fascia and tendon of lower back, subsequent encounter: Secondary | ICD-10-CM | POA: Diagnosis not present

## 2020-11-23 DIAGNOSIS — M542 Cervicalgia: Secondary | ICD-10-CM | POA: Diagnosis not present

## 2020-11-30 DIAGNOSIS — M542 Cervicalgia: Secondary | ICD-10-CM | POA: Diagnosis not present

## 2020-11-30 DIAGNOSIS — R262 Difficulty in walking, not elsewhere classified: Secondary | ICD-10-CM | POA: Diagnosis not present

## 2020-11-30 DIAGNOSIS — R519 Headache, unspecified: Secondary | ICD-10-CM | POA: Diagnosis not present

## 2020-11-30 DIAGNOSIS — M6281 Muscle weakness (generalized): Secondary | ICD-10-CM | POA: Diagnosis not present

## 2020-11-30 DIAGNOSIS — S39012D Strain of muscle, fascia and tendon of lower back, subsequent encounter: Secondary | ICD-10-CM | POA: Diagnosis not present

## 2020-11-30 DIAGNOSIS — M545 Low back pain, unspecified: Secondary | ICD-10-CM | POA: Diagnosis not present

## 2020-11-30 DIAGNOSIS — M2560 Stiffness of unspecified joint, not elsewhere classified: Secondary | ICD-10-CM | POA: Diagnosis not present

## 2020-12-02 ENCOUNTER — Ambulatory Visit: Payer: Medicare HMO | Admitting: Psychiatry

## 2020-12-08 ENCOUNTER — Ambulatory Visit (INDEPENDENT_AMBULATORY_CARE_PROVIDER_SITE_OTHER): Payer: Medicare HMO | Admitting: Psychiatry

## 2020-12-08 ENCOUNTER — Encounter: Payer: Self-pay | Admitting: Psychiatry

## 2020-12-08 ENCOUNTER — Other Ambulatory Visit: Payer: Self-pay

## 2020-12-08 DIAGNOSIS — G9332 Myalgic encephalomyelitis/chronic fatigue syndrome: Secondary | ICD-10-CM

## 2020-12-08 DIAGNOSIS — R5382 Chronic fatigue, unspecified: Secondary | ICD-10-CM | POA: Diagnosis not present

## 2020-12-08 DIAGNOSIS — F40232 Fear of other medical care: Secondary | ICD-10-CM

## 2020-12-08 DIAGNOSIS — F422 Mixed obsessional thoughts and acts: Secondary | ICD-10-CM | POA: Diagnosis not present

## 2020-12-08 DIAGNOSIS — G479 Sleep disorder, unspecified: Secondary | ICD-10-CM

## 2020-12-08 DIAGNOSIS — F331 Major depressive disorder, recurrent, moderate: Secondary | ICD-10-CM

## 2020-12-08 NOTE — Progress Notes (Signed)
Adam Meyers [id number] 1960/10/11 60 y.o.   Subjective:   Patient ID:  Adam Meyers is a 60 y.o. (DOB 1960-10-21) male.  Chief Complaint:  Chief Complaint  Patient presents with   Follow-up   Mixed obsessional thoughts and acts   Anxiety    Depression        Associated symptoms include fatigue and headaches. [name] presents for follow-up of OCD and anxiety.  seen November 2020.  No meds were changed at the time.  He was taking Luvox 150 mg a day and diazepam 5 mg at night for sleep  08/13/19 appt with the following noted: Worrying over pending loss of power bc 78 yo mother with him.  Her dementia is progressing.  Has daily help now. Stress living with mother.  Hosp for a month 2020 with cardiac and stroke problems.  Minor stroke.  Caretaking mother  Better TMJ with diazepam at HS.  Fear of the dentist now.  Fear of other doctors also. Was taking diazepam 5 hs for sleep.  Then did ok without it and stopped it. Half the time forgets the am dose of fluvoxamine.  Asks what that might do.  Not sure if anxiety or OCD is worse.  Sometimes lethargic.  Able to get out of the house some now with help.  Some germ issues at the grocery store periodically if everything doesn't go just right. Plan: be CW med.  No changes  02/11/20 appt with the following noted: Only taking fluvoxamine 100 mg daily. Vaccinated July 8, but "pushed every button that I got." for OCD.  Hard to make decisions and second guessing himself.   Son [name] off to [place] and pt got stomach bug a couple of days later and now fatigued.  That also triggered anxiety.  Being in a hotel room triggered him also.   Plan rec fluvoxamine 150 for better control  07/14/2020 appt noted: Never followed through on increasing fluvoxamine to 150 mg daily.  Doesn't like meds.   HA more frequent lately and today. Caretaking wearing him down.  Avoids dentist bc OCD.  Chronic anxiety.  Plan: Rec increase fluvoxamine but he  complains grogginess and sex SE at higher dosage.  Be consistent with 150 mg daily.  He probably will not.  12/08/20 appt noted: [name] Retired. Concerns about bruised leg discussed. General health concerns.   Stayed on Luvox 100 daily since here and never increased. Chronic anxiety and some is somatic. Handled recent OCD trigger OK. Fear of dentist but needs to go. Tired all the time. Neighbors bothering sleep occasionally.  Patient reports stable mood and denies depressed or irritable moods.  Patient denies difficulty with sleep initiation or maintenance. 10-11 hours typical lately.  Likes to sleep a lot and don't like to talk a lot.  Low energy. Brain won't work with less. Strange dreams.  Denies appetite disturbance.  Patient reports that energy and motivation have been good.  Patient denies any difficulty with concentration.  Patient denies any suicidal ideation.  Sensitive to loud noises.  Never bothered me before my meltdown.  Past Psychiatric Medication Trials: Paxil Sex SE  Afraid of Zoloft bc mother hallucinated on it. Luvox 200 cog SE  pindolol, propranolol, diazepam  Review of Systems:  Review of Systems  Constitutional:  Positive for fatigue.  HENT:  Negative for postnasal drip, rhinorrhea and sneezing.        Jaw pain  Musculoskeletal:  Positive for arthralgias, gait problem and neck  pain.  Skin:  Positive for rash.  Neurological:  Positive for headaches. Negative for tremors and weakness.  Psychiatric/Behavioral:  The patient is nervous/anxious.    Medications: I have reviewed the patient's current medications.  Current Outpatient Medications  Medication Sig Dispense Refill   Cholecalciferol (VITAMIN D) 50 MCG (2000 UT) CAPS Take by mouth.     diazepam (VALIUM) 5 MG tablet TAKE 1 TABLET BY MOUTH THREE TIMES A DAY AS NEEDED FOR ANXIETY 90 tablet 2   famotidine (PEPCID) 20 MG tablet Take 20 mg by mouth daily as needed for heartburn or indigestion.      fluvoxaMINE (LUVOX) 100 MG tablet Take 1/2 tablet (50 mg) by mouth every morning and 1 tablet (100 mg) at bedtime (Patient taking differently: Take 100 mg by mouth at bedtime. Take 1/2 tablet (50 mg) by mouth every morning and 1 tablet (100 mg) at bedtime) 135 tablet 1   ibuprofen (ADVIL) 200 MG tablet Take 600 mg by mouth every 8 (eight) hours as needed for moderate pain.      No current facility-administered medications for this visit.    Medication Side Effects: None  Allergies:  Allergies  Allergen Reactions   Oxycodone-Acetaminophen Itching   Seasonal Ic [Cholestatin]     Drainage,itchy eyes   Gabapentin Anxiety   Percocet [Oxycodone-Acetaminophen] Itching and Anxiety    Past Medical History:  Diagnosis Date   Compressed cervical disc    Cough    Depression    Hyperlipidemia    Nasal congestion    Social anxiety disorder 04/10/2018    Family History  Problem Relation Age of Onset   Cancer Maternal Grandmother        melanoma    Social History   Socioeconomic History   Marital status: Married    Spouse name: Not on file   Number of children: Not on file   Years of education: Not on file   Highest education level: Not on file  Occupational History   Not on file  Tobacco Use   Smoking status: Never   Smokeless tobacco: Never  Substance and Sexual Activity   Alcohol use: No   Drug use: No   Sexual activity: Not on file  Other Topics Concern   Not on file  Social History Narrative   Not on file   Social Determinants of Health   Financial Resource Strain: Not on file  Food Insecurity: Not on file  Transportation Needs: Not on file  Physical Activity: Not on file  Stress: Not on file  Social Connections: Not on file  Intimate Partner Violence: Not on file    Past Medical History, Surgical history, Social history, and Family history were reviewed and updated as appropriate.   Please see review of systems for further details on the patient's review  from today.   Objective:   Physical Exam:  There were no vitals taken for this visit.  Physical Exam Constitutional:      General: He is not in acute distress. Musculoskeletal:        General: No deformity.  Neurological:     Mental Status: He is alert and oriented to person, place, and time.     Cranial Nerves: No dysarthria.     Coordination: Coordination normal.  Psychiatric:        Attention and Perception: Attention and perception normal. He does not perceive auditory or visual hallucinations.        Mood and Affect: Mood is anxious. Mood is  not depressed. Affect is not labile, blunt, angry or inappropriate.        Speech: Speech normal.        Behavior: Behavior normal. Behavior is cooperative.        Thought Content: Thought content normal. Thought content is not paranoid or delusional. Thought content does not include homicidal or suicidal ideation. Thought content does not include homicidal or suicidal plan.        Cognition and Memory: Cognition and memory normal.        Judgment: Judgment normal.     Comments: Residual chronic OCD but no worse. It still interferes with function and QOL markedly. Chronically fearful of med change. Fair insight and judgment Talkative without pressure     Lab Review:  No results found for: NA, K, CL, CO2, GLUCOSE, BUN, CREATININE, CALCIUM, PROT, ALBUMIN, AST, ALT, ALKPHOS, BILITOT, GFRNONAA, GFRAA  No results found for: WBC, RBC, HGB, HCT, PLT, MCV, MCH, MCHC, RDW, LYMPHSABS, MONOABS, EOSABS, BASOSABS  No results found for: POCLITH, LITHIUM   No results found for: PHENYTOIN, PHENOBARB, VALPROATE, CBMZ   .res Assessment: Plan:    OCD - contamination type  Major depressive disorder, recurrent episode, moderate (HCC)  Chronic fatigue syndrome  Sleep disturbance   Greater than 50% 30 min of face to face time with patient was spent on counseling and coordination of care. We discussed [name] has a long history of OCD and a history  of depression as well and first came to our practice and April 2014.  He continues in therapy with Dr. Amado Coe.  While he has made a lot of progress and is much less distressed by his OCD.  He remains disabled because of time requirements of the OCD and he is easily triggered into compulsive rituals with high levels of anxiety.  With his limited life demands and relative isolation he manages acceptably well.  He does not want to take take more SSRI medication because of sexual side effects are worse at higher dosages.  Supportive therapy dealing with stressors of care taking mother and how that plays into fears of med changes.    Last med change was very difficult for him so he doesn't want to pursue it.    Continue meds.  Benefit from meds. Rec Cont it longterm bc high relapse risk.  Emphasized need for consistency.   Answered his questions about need for it longterm medication given severity of his sx historically.    Rec increase fluvoxamine but he complains grogginess and sex SE at higher dosage.  Be consistent with 150 mg daily.  He probably will not. Extensive discussion of dx OCD and will likely need meds for OCD for the rest of his life DT disability of OCD.  Option change SSRI to Prozac to reduce fatigue.  Rec it.  He's afraid of Zoloft bc mother had a bad time with it.  He's afraid of switching meds.  Increase diazepam 5 mg HS has helped TMJ.  Disc SE. Can take extra to go to dentist. But dont' drive if drowsy.  Disc dental phobia at length.  FU 6 mos    Meredith Staggers, MD, DFAPA   Please see After Visit Summary for patient specific instructions.  Future Appointments  Date Time Provider Department Center  12/23/2020  3:00 PM [name], PhD [facility] None  01/12/2021  3:00 PM [name], PhD [facility] None  02/03/2021  3:00 PM [name], PhD [facility] None    No orders of  the defined types were placed in this encounter.     -------------------------------

## 2020-12-09 DIAGNOSIS — R519 Headache, unspecified: Secondary | ICD-10-CM | POA: Diagnosis not present

## 2020-12-09 DIAGNOSIS — M6281 Muscle weakness (generalized): Secondary | ICD-10-CM | POA: Diagnosis not present

## 2020-12-09 DIAGNOSIS — S39012D Strain of muscle, fascia and tendon of lower back, subsequent encounter: Secondary | ICD-10-CM | POA: Diagnosis not present

## 2020-12-09 DIAGNOSIS — M542 Cervicalgia: Secondary | ICD-10-CM | POA: Diagnosis not present

## 2020-12-09 DIAGNOSIS — M545 Low back pain, unspecified: Secondary | ICD-10-CM | POA: Diagnosis not present

## 2020-12-09 DIAGNOSIS — M2560 Stiffness of unspecified joint, not elsewhere classified: Secondary | ICD-10-CM | POA: Diagnosis not present

## 2020-12-09 DIAGNOSIS — R262 Difficulty in walking, not elsewhere classified: Secondary | ICD-10-CM | POA: Diagnosis not present

## 2020-12-21 DIAGNOSIS — R262 Difficulty in walking, not elsewhere classified: Secondary | ICD-10-CM | POA: Diagnosis not present

## 2020-12-21 DIAGNOSIS — M2560 Stiffness of unspecified joint, not elsewhere classified: Secondary | ICD-10-CM | POA: Diagnosis not present

## 2020-12-21 DIAGNOSIS — M545 Low back pain, unspecified: Secondary | ICD-10-CM | POA: Diagnosis not present

## 2020-12-21 DIAGNOSIS — M6281 Muscle weakness (generalized): Secondary | ICD-10-CM | POA: Diagnosis not present

## 2020-12-21 DIAGNOSIS — M542 Cervicalgia: Secondary | ICD-10-CM | POA: Diagnosis not present

## 2020-12-21 DIAGNOSIS — R519 Headache, unspecified: Secondary | ICD-10-CM | POA: Diagnosis not present

## 2020-12-21 DIAGNOSIS — S39012D Strain of muscle, fascia and tendon of lower back, subsequent encounter: Secondary | ICD-10-CM | POA: Diagnosis not present

## 2020-12-23 ENCOUNTER — Ambulatory Visit (INDEPENDENT_AMBULATORY_CARE_PROVIDER_SITE_OTHER): Payer: Medicare HMO | Admitting: Psychiatry

## 2020-12-23 ENCOUNTER — Other Ambulatory Visit: Payer: Self-pay

## 2020-12-23 DIAGNOSIS — F422 Mixed obsessional thoughts and acts: Secondary | ICD-10-CM | POA: Diagnosis not present

## 2020-12-23 DIAGNOSIS — M549 Dorsalgia, unspecified: Secondary | ICD-10-CM | POA: Diagnosis not present

## 2020-12-23 DIAGNOSIS — Z638 Other specified problems related to primary support group: Secondary | ICD-10-CM | POA: Diagnosis not present

## 2020-12-23 DIAGNOSIS — G988 Other disorders of nervous system: Secondary | ICD-10-CM | POA: Diagnosis not present

## 2020-12-23 DIAGNOSIS — M26609 Unspecified temporomandibular joint disorder, unspecified side: Secondary | ICD-10-CM

## 2020-12-23 DIAGNOSIS — F40232 Fear of other medical care: Secondary | ICD-10-CM | POA: Diagnosis not present

## 2020-12-23 DIAGNOSIS — G8929 Other chronic pain: Secondary | ICD-10-CM

## 2020-12-23 DIAGNOSIS — F4010 Social phobia, unspecified: Secondary | ICD-10-CM | POA: Diagnosis not present

## 2020-12-23 NOTE — Progress Notes (Signed)
Psychotherapy Progress Note [facility] [name], PhD LP  Patient ID: Adam Meyers     MRN: [id number] Therapy format: Individual psychotherapy Date: 12/23/2020      Start: 3:18p     Stop: 4:08p     Time Spent: 50 min Location: In-person   Session narrative (presenting needs, interim history, self-report of stressors and symptoms, applications of prior therapy, status changes, and interventions made in session) Made it to the dentist yesterday -- highly anxiety-provoking, but knew he had to deal with it lest he allow an infection.  Had to find a dentist in network, worked through apprehension, it was something of an out-of-body experience, but got to it, through it, got x-rays, and has cleaning set up.  Knows now he has an abscess, is on amoxicillin, and will have to have a root canal, referred to a specialist to conserve stress on his TMJ.  Challenge to get 4 doses of antibiotic down right now, but trying.  Challenged also to steel himself for the root canal, which will be July 13, earlier on cancellation.  Affirmed the courage used going through procedures ad the assertiveness of explaining self as an anxious patient.  Problem-solved possible ride for the root canal, given lack of available family or friends.  Second victory re. niece's performance in a local production of [employer].  Has committed to go to the show, though the crowd is intimidating, high-pitched singing is provocative, and he fears the embarrassment of having to get up during the show.  Would rather sit at the back for these reasons, but family intent on sitting up front for view of niece.  Problem-solved for access to bathroom, mitigation of provocative sound, and conducted imaginal practice getting up and noticing how other audience members specifically do not pay attention to him but focus on the show.  Therapeutic modalities: Cognitive Behavioral Therapy and Solution-Oriented/Positive  Psychology  Mental Status/Observations:  Appearance:   Casual     Behavior:  Appropriate  Motor:  Normal  Speech/Language:   Clear and Coherent  Affect:  Constricted  Mood:  anxious  Thought process:  normal  Thought content:    Obsessions  Sensory/Perceptual disturbances:    WNL  Orientation:  Fully oriented  Attention:  Good    Concentration:  Good  Memory:  WNL  Insight:    Good  Judgment:   Good  Impulse Control:  Good   Risk Assessment: Danger to Self: No Self-injurious Behavior: No Danger to Others: No Physical Aggression / Violence: No Duty to Warn: No Access to Firearms a concern: No  Assessment of progress:  progressing  Diagnosis:   ICD-10-CM   1. 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 Relationship problem with family member  Z63.8     4. Social anxiety disorder  F40.10     5. Chronic back pain, unspecified back location, unspecified back pain laterality  M54.9    G89.29     6. 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 possible rides to root canal visit Imaginal practice getting up in the show and it being OK, using earplugs or fingers in ears to attenuate intolerable sound, and noticing how niece appreciates him being there Other recommendations/advice as may  be noted above Continue to utilize previously learned skills ad lib Maintain medication as prescribed and work faithfully with relevant prescriber(s) if any changes are desired or seem indicated Call the clinic on-call service, present to ER, or call 911 if any life-threatening psychiatric crisis Return in about 3 weeks (around 01/13/2021) for session(s) already scheduled. Already scheduled visit in this office 01/12/2021.  [name], PhD [name], PhD LP Clinical Psychologist, [facility]  Group [facility] [address], [address] [place], [place] [address] [phone]

## 2020-12-28 DIAGNOSIS — M2560 Stiffness of unspecified joint, not elsewhere classified: Secondary | ICD-10-CM | POA: Diagnosis not present

## 2020-12-28 DIAGNOSIS — M6281 Muscle weakness (generalized): Secondary | ICD-10-CM | POA: Diagnosis not present

## 2020-12-28 DIAGNOSIS — R519 Headache, unspecified: Secondary | ICD-10-CM | POA: Diagnosis not present

## 2020-12-28 DIAGNOSIS — M542 Cervicalgia: Secondary | ICD-10-CM | POA: Diagnosis not present

## 2020-12-28 DIAGNOSIS — M545 Low back pain, unspecified: Secondary | ICD-10-CM | POA: Diagnosis not present

## 2020-12-28 DIAGNOSIS — R262 Difficulty in walking, not elsewhere classified: Secondary | ICD-10-CM | POA: Diagnosis not present

## 2020-12-28 DIAGNOSIS — S39012D Strain of muscle, fascia and tendon of lower back, subsequent encounter: Secondary | ICD-10-CM | POA: Diagnosis not present

## 2021-01-12 ENCOUNTER — Ambulatory Visit (INDEPENDENT_AMBULATORY_CARE_PROVIDER_SITE_OTHER): Payer: Medicare HMO | Admitting: Psychiatry

## 2021-01-12 ENCOUNTER — Other Ambulatory Visit: Payer: Self-pay

## 2021-01-12 DIAGNOSIS — F422 Mixed obsessional thoughts and acts: Secondary | ICD-10-CM | POA: Diagnosis not present

## 2021-01-12 DIAGNOSIS — F40232 Fear of other medical care: Secondary | ICD-10-CM

## 2021-01-12 NOTE — Progress Notes (Signed)
Psychotherapy Progress Note [facility] [name], PhD LP  Patient ID: Adam Meyers     MRN: [id number] Therapy format: Individual psychotherapy Date: 01/12/2021      Start: 3:19p     Stop: 4:08p     Time Spent: 49 min Location: In-person   Session narrative (presenting needs, interim history, self-report of stressors and symptoms, applications of prior therapy, status changes, and interventions made in session) Got his root canal done.  Couldn't abide the panoramic xray.  Effectively introduced himself as dental phobic, getting understanding that he may zone out and not comprehend.  Thankfully, they used gas, and the dentist had a knack for numbing.  Going back tomorrow for xrays, getting apprehensive again.  Coached in reminding himself he'll be going in order to help his "help" get information enough to help what hurts.  Coached in imaginally transforming the xray experience, e.g., mentally playing the [employer] ski jumping game in his mind to hold still.  Aug 3 will also have a catch-up dental cleaning also.  Forecast anxiety triggers, recommended practice biting some implement at home to accustom himself to a foreign object in his mouth.  Imaginally, practice allowing and trusting now-accepted dentist and hygienist in for the work.  Therapeutic modalities: Cognitive Behavioral Therapy and Solution-Oriented/Positive Psychology  Mental Status/Observations:  Appearance:   Casual     Behavior:  Appropriate  Motor:  Normal  Speech/Language:   Clear and Coherent  Affect:  Appropriate  Mood:  anxious  Thought process:  normal  Thought content:    Obsessions  Sensory/Perceptual disturbances:    WNL  Orientation:  Fully oriented  Attention:  Good    Concentration:  Good  Memory:  WNL  Insight:    Good  Judgment:   Good  Impulse Control:  Good   Risk Assessment: Danger to Self: No Self-injurious Behavior: No Danger to Others: No Physical Aggression /  Violence: No Duty to Warn: No Access to Firearms a concern: No  Assessment of progress:  progressing  Diagnosis:   ICD-10-CM   1. 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 Already scheduled visit in this office 02/03/2021.  [name], PhD [name], PhD LP Clinical Psychologist, [facility] [address] [place], [place] [address] [phone]

## 2021-01-25 DIAGNOSIS — M542 Cervicalgia: Secondary | ICD-10-CM | POA: Diagnosis not present

## 2021-01-25 DIAGNOSIS — M545 Low back pain, unspecified: Secondary | ICD-10-CM | POA: Diagnosis not present

## 2021-01-25 DIAGNOSIS — M2560 Stiffness of unspecified joint, not elsewhere classified: Secondary | ICD-10-CM | POA: Diagnosis not present

## 2021-01-25 DIAGNOSIS — R262 Difficulty in walking, not elsewhere classified: Secondary | ICD-10-CM | POA: Diagnosis not present

## 2021-01-25 DIAGNOSIS — S39012D Strain of muscle, fascia and tendon of lower back, subsequent encounter: Secondary | ICD-10-CM | POA: Diagnosis not present

## 2021-01-25 DIAGNOSIS — R519 Headache, unspecified: Secondary | ICD-10-CM | POA: Diagnosis not present

## 2021-01-25 DIAGNOSIS — M6281 Muscle weakness (generalized): Secondary | ICD-10-CM | POA: Diagnosis not present

## 2021-01-29 ENCOUNTER — Other Ambulatory Visit: Payer: Self-pay | Admitting: Psychiatry

## 2021-01-29 DIAGNOSIS — Z636 Dependent relative needing care at home: Secondary | ICD-10-CM

## 2021-01-29 DIAGNOSIS — F4010 Social phobia, unspecified: Secondary | ICD-10-CM

## 2021-01-29 DIAGNOSIS — M26609 Unspecified temporomandibular joint disorder, unspecified side: Secondary | ICD-10-CM

## 2021-01-29 DIAGNOSIS — F422 Mixed obsessional thoughts and acts: Secondary | ICD-10-CM

## 2021-01-31 NOTE — Telephone Encounter (Signed)
Pt is out of his medicine

## 2021-02-01 DIAGNOSIS — M545 Low back pain, unspecified: Secondary | ICD-10-CM | POA: Diagnosis not present

## 2021-02-01 DIAGNOSIS — R519 Headache, unspecified: Secondary | ICD-10-CM | POA: Diagnosis not present

## 2021-02-01 DIAGNOSIS — M542 Cervicalgia: Secondary | ICD-10-CM | POA: Diagnosis not present

## 2021-02-01 DIAGNOSIS — R262 Difficulty in walking, not elsewhere classified: Secondary | ICD-10-CM | POA: Diagnosis not present

## 2021-02-01 DIAGNOSIS — M6281 Muscle weakness (generalized): Secondary | ICD-10-CM | POA: Diagnosis not present

## 2021-02-01 DIAGNOSIS — M2560 Stiffness of unspecified joint, not elsewhere classified: Secondary | ICD-10-CM | POA: Diagnosis not present

## 2021-02-01 DIAGNOSIS — S39012D Strain of muscle, fascia and tendon of lower back, subsequent encounter: Secondary | ICD-10-CM | POA: Diagnosis not present

## 2021-02-03 ENCOUNTER — Other Ambulatory Visit: Payer: Self-pay

## 2021-02-03 ENCOUNTER — Ambulatory Visit (INDEPENDENT_AMBULATORY_CARE_PROVIDER_SITE_OTHER): Payer: Medicare HMO | Admitting: Psychiatry

## 2021-02-03 DIAGNOSIS — R5382 Chronic fatigue, unspecified: Secondary | ICD-10-CM

## 2021-02-03 DIAGNOSIS — G988 Other disorders of nervous system: Secondary | ICD-10-CM

## 2021-02-03 DIAGNOSIS — M26609 Unspecified temporomandibular joint disorder, unspecified side: Secondary | ICD-10-CM | POA: Diagnosis not present

## 2021-02-03 DIAGNOSIS — F40232 Fear of other medical care: Secondary | ICD-10-CM | POA: Diagnosis not present

## 2021-02-03 DIAGNOSIS — F422 Mixed obsessional thoughts and acts: Secondary | ICD-10-CM

## 2021-02-03 DIAGNOSIS — G9332 Myalgic encephalomyelitis/chronic fatigue syndrome: Secondary | ICD-10-CM
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
## 2021-02-08 ENCOUNTER — Telehealth: Payer: Self-pay | Admitting: Psychiatry

## 2021-02-08 NOTE — Telephone Encounter (Signed)
Depends on which muscle relaxer.  Find out the name of it.

## 2021-02-08 NOTE — Telephone Encounter (Signed)
Please advise 

## 2021-02-08 NOTE — Telephone Encounter (Signed)
Pt has TMJ and saw dentist. Thinks the dentist may prescribe a muscle relaxer. Is this ok to take?

## 2021-02-09 NOTE — Telephone Encounter (Signed)
LVM for pt to return call

## 2021-02-10 DIAGNOSIS — R262 Difficulty in walking, not elsewhere classified: Secondary | ICD-10-CM | POA: Diagnosis not present

## 2021-02-10 DIAGNOSIS — S39012D Strain of muscle, fascia and tendon of lower back, subsequent encounter: Secondary | ICD-10-CM | POA: Diagnosis not present

## 2021-02-10 DIAGNOSIS — M542 Cervicalgia: Secondary | ICD-10-CM | POA: Diagnosis not present

## 2021-02-10 DIAGNOSIS — R519 Headache, unspecified: Secondary | ICD-10-CM | POA: Diagnosis not present

## 2021-02-10 DIAGNOSIS — M545 Low back pain, unspecified: Secondary | ICD-10-CM | POA: Diagnosis not present

## 2021-02-10 DIAGNOSIS — M2560 Stiffness of unspecified joint, not elsewhere classified: Secondary | ICD-10-CM | POA: Diagnosis not present

## 2021-02-10 DIAGNOSIS — M6281 Muscle weakness (generalized): Secondary | ICD-10-CM | POA: Diagnosis not present

## 2021-02-15 ENCOUNTER — Other Ambulatory Visit: Payer: Self-pay

## 2021-02-15 ENCOUNTER — Ambulatory Visit (INDEPENDENT_AMBULATORY_CARE_PROVIDER_SITE_OTHER): Payer: Medicare HMO | Admitting: Psychiatry

## 2021-02-15 DIAGNOSIS — F40232 Fear of other medical care: Secondary | ICD-10-CM | POA: Diagnosis not present

## 2021-02-15 DIAGNOSIS — M26609 Unspecified temporomandibular joint disorder, unspecified side: Secondary | ICD-10-CM

## 2021-02-15 DIAGNOSIS — R5382 Chronic fatigue, unspecified: Secondary | ICD-10-CM

## 2021-02-15 DIAGNOSIS — F422 Mixed obsessional thoughts and acts: Secondary | ICD-10-CM | POA: Diagnosis not present

## 2021-02-15 DIAGNOSIS — G9332 Myalgic encephalomyelitis/chronic fatigue syndrome: Secondary | ICD-10-CM

## 2021-02-15 DIAGNOSIS — F4010 Social phobia, unspecified: Secondary | ICD-10-CM | POA: Diagnosis not present

## 2021-02-15 NOTE — Progress Notes (Signed)
Psychotherapy Progress Note [facility] [name], PhD LP  Patient ID: Adam Meyers     MRN: [id number] Therapy format: Individual psychotherapy Date: 02/15/2021      Start: 1:08p     Stop: 1:58p     Time Spent: 50 min Location: In-person   Session narrative (presenting needs, interim history, self-report of stressors and symptoms, applications of prior therapy, status changes, and interventions made in session) Urgent scheduling.  Jaw and tooth in pain currently, recognizes it can cloud his judgment.  Partway through dental needs, but feeling too overwhelmed to press further.  Needs a talk-down right now from yesterday, when Valium wore off about 6:30p and the obsessive thoughts started coming.  Day had started well, but issue with mother's phone getting cut off (late payment), plus nerves about private information ([facility] printout) discoverable in his car when he took it in for inspection.  Louder intrusive thoughts yesterday than had been in years, basically for the threat of embarrassment if they saw it laying there.  Normally scrupulous about hiding personal information when he takes car in.    Rationally reframed in session, analyzed how/why it bothered him, confronted realistic worse-case scenarios, and fleshed out realistically reassuring mental images of mechanics seeing "personal stuff" all the time and leaving it alone because they have performance pressures to conform to, a kind of ethical and business code not to disturb personal effects, and generally no interest.  Imagines, at worst, if mechanic did pick it up, turn it over, make the connection, and think about a relative or himself getting services.  Therapeutic modalities: Cognitive Behavioral Therapy, Solution-Oriented/Positive Psychology, and Ego-Supportive  Mental Status/Observations:  Appearance:   Casual and Neat     Behavior:  tense  Motor:  Restlestness  Speech/Language:   Clear and  Coherent  Affect:  Appropriate  Mood:  anxious  Thought process:  normal  Thought content:    Obsessions  Sensory/Perceptual disturbances:    WNL  Orientation:  Fully oriented  Attention:  Good    Concentration:  Good  Memory:  WNL  Insight:    Fair  Judgment:   Good  Impulse Control:  Good   Risk Assessment: Danger to Self: No Self-injurious Behavior: No Danger to Others: No Physical Aggression / Violence: No Duty to Warn: No Access to Firearms a concern: No  Assessment of progress:  stabilized  Diagnosis:   ICD-10-CM   1. 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 Social anxiety disorder  F40.10     3. 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     5. Chronic fatigue syndrome  R53.82      Plan:  Fight obsessive imagery with realistic imagery -- visualize mechanic noticing and not disturbing the paper, or merely setting it aside, or looking and respectfully keeping silence, with back story of being or knowing someone in therapy/med mgmt and sympathetic Carry through needed dental procedures, continuing to apply good reasons to submit and visualizing positive outcomes Remains disabled from any occupation due to overall symptom severity Other recommendations/advice as may be noted above Continue to utilize previously learned skills ad lib Maintain medication as prescribed and work faithfully with relevant prescriber(s) if any changes are desired or seem indicated Call the clinic on-call service, present to ER, or call 911 if any life-threatening psychiatric crisis Return for time as available. Already scheduled visit in this office  03/10/2021.  [name], PhD [name], PhD LP Clinical Psychologist, [facility] [address], [address] [place], [place] [address] [phone]

## 2021-02-17 DIAGNOSIS — M545 Low back pain, unspecified: Secondary | ICD-10-CM | POA: Diagnosis not present

## 2021-02-17 DIAGNOSIS — S39012D Strain of muscle, fascia and tendon of lower back, subsequent encounter: Secondary | ICD-10-CM | POA: Diagnosis not present

## 2021-02-17 DIAGNOSIS — M542 Cervicalgia: Secondary | ICD-10-CM | POA: Diagnosis not present

## 2021-02-17 DIAGNOSIS — M6281 Muscle weakness (generalized): Secondary | ICD-10-CM | POA: Diagnosis not present

## 2021-02-17 DIAGNOSIS — R519 Headache, unspecified: Secondary | ICD-10-CM | POA: Diagnosis not present

## 2021-02-17 DIAGNOSIS — R262 Difficulty in walking, not elsewhere classified: Secondary | ICD-10-CM | POA: Diagnosis not present

## 2021-02-17 DIAGNOSIS — M2560 Stiffness of unspecified joint, not elsewhere classified: Secondary | ICD-10-CM | POA: Diagnosis not present

## 2021-02-24 ENCOUNTER — Ambulatory Visit: Payer: Medicare HMO | Admitting: Psychiatry

## 2021-02-24 DIAGNOSIS — M6281 Muscle weakness (generalized): Secondary | ICD-10-CM | POA: Diagnosis not present

## 2021-02-24 DIAGNOSIS — R262 Difficulty in walking, not elsewhere classified: Secondary | ICD-10-CM | POA: Diagnosis not present

## 2021-02-24 DIAGNOSIS — R519 Headache, unspecified: Secondary | ICD-10-CM | POA: Diagnosis not present

## 2021-02-24 DIAGNOSIS — M2560 Stiffness of unspecified joint, not elsewhere classified: Secondary | ICD-10-CM | POA: Diagnosis not present

## 2021-02-24 DIAGNOSIS — M545 Low back pain, unspecified: Secondary | ICD-10-CM | POA: Diagnosis not present

## 2021-02-24 DIAGNOSIS — S39012D Strain of muscle, fascia and tendon of lower back, subsequent encounter: Secondary | ICD-10-CM | POA: Diagnosis not present

## 2021-02-24 DIAGNOSIS — M542 Cervicalgia: Secondary | ICD-10-CM | POA: Diagnosis not present

## 2021-03-02 DIAGNOSIS — S39012D Strain of muscle, fascia and tendon of lower back, subsequent encounter: Secondary | ICD-10-CM | POA: Diagnosis not present

## 2021-03-02 DIAGNOSIS — M545 Low back pain, unspecified: Secondary | ICD-10-CM | POA: Diagnosis not present

## 2021-03-02 DIAGNOSIS — R519 Headache, unspecified: Secondary | ICD-10-CM | POA: Diagnosis not present

## 2021-03-02 DIAGNOSIS — M6281 Muscle weakness (generalized): Secondary | ICD-10-CM | POA: Diagnosis not present

## 2021-03-02 DIAGNOSIS — M2560 Stiffness of unspecified joint, not elsewhere classified: Secondary | ICD-10-CM | POA: Diagnosis not present

## 2021-03-02 DIAGNOSIS — M542 Cervicalgia: Secondary | ICD-10-CM | POA: Diagnosis not present

## 2021-03-02 DIAGNOSIS — R262 Difficulty in walking, not elsewhere classified: Secondary | ICD-10-CM | POA: Diagnosis not present

## 2021-03-08 DIAGNOSIS — M545 Low back pain, unspecified: Secondary | ICD-10-CM | POA: Diagnosis not present

## 2021-03-08 DIAGNOSIS — M2560 Stiffness of unspecified joint, not elsewhere classified: Secondary | ICD-10-CM | POA: Diagnosis not present

## 2021-03-08 DIAGNOSIS — R519 Headache, unspecified: Secondary | ICD-10-CM | POA: Diagnosis not present

## 2021-03-08 DIAGNOSIS — M6281 Muscle weakness (generalized): Secondary | ICD-10-CM | POA: Diagnosis not present

## 2021-03-08 DIAGNOSIS — M542 Cervicalgia: Secondary | ICD-10-CM | POA: Diagnosis not present

## 2021-03-08 DIAGNOSIS — R262 Difficulty in walking, not elsewhere classified: Secondary | ICD-10-CM | POA: Diagnosis not present

## 2021-03-08 DIAGNOSIS — S39012D Strain of muscle, fascia and tendon of lower back, subsequent encounter: Secondary | ICD-10-CM | POA: Diagnosis not present

## 2021-03-10 ENCOUNTER — Encounter: Payer: Self-pay | Admitting: Psychiatry

## 2021-03-10 ENCOUNTER — Ambulatory Visit (INDEPENDENT_AMBULATORY_CARE_PROVIDER_SITE_OTHER): Payer: Medicare HMO | Admitting: Psychiatry

## 2021-03-10 ENCOUNTER — Other Ambulatory Visit: Payer: Self-pay

## 2021-03-10 ENCOUNTER — Ambulatory Visit: Payer: Medicare HMO | Admitting: Psychiatry

## 2021-03-10 DIAGNOSIS — G988 Other disorders of nervous system: Secondary | ICD-10-CM | POA: Diagnosis not present

## 2021-03-10 DIAGNOSIS — F40232 Fear of other medical care: Secondary | ICD-10-CM | POA: Diagnosis not present

## 2021-03-10 DIAGNOSIS — G9332 Myalgic encephalomyelitis/chronic fatigue syndrome: Secondary | ICD-10-CM

## 2021-03-10 DIAGNOSIS — F422 Mixed obsessional thoughts and acts: Secondary | ICD-10-CM

## 2021-03-10 DIAGNOSIS — F4010 Social phobia, unspecified: Secondary | ICD-10-CM

## 2021-03-10 DIAGNOSIS — R5382 Chronic fatigue, unspecified: Secondary | ICD-10-CM | POA: Diagnosis not present

## 2021-03-10 DIAGNOSIS — G479 Sleep disorder, unspecified: Secondary | ICD-10-CM | POA: Diagnosis not present

## 2021-03-10 DIAGNOSIS — F3341 Major depressive disorder, recurrent, in partial remission: Secondary | ICD-10-CM | POA: Diagnosis not present

## 2021-03-10 DIAGNOSIS — M26609 Unspecified temporomandibular joint disorder, unspecified side: Secondary | ICD-10-CM

## 2021-03-10 MED ORDER — FLUVOXAMINE MALEATE 100 MG PO TABS: ORAL_TABLET | ORAL | 1 refills | Status: DC

## 2021-03-10 NOTE — Progress Notes (Signed)
Adam Meyers [id number] Jan 19, 1961 60 y.o.   Subjective:   Patient ID:  Adam Meyers is a 60 y.o. (DOB [birth year]) male.  Chief Complaint:  Chief Complaint  Patient presents with   Follow-up   Anxiety    Depression        Associated symptoms include fatigue and headaches. Adam Meyers presents for follow-up of OCD and anxiety.  seen November 2020.  No meds were changed at the time.  He was taking Luvox 150 mg a day and diazepam 5 mg at night for sleep  08/13/19 appt with the following noted: Worrying over pending loss of power bc 23 yo mother with him.  Her dementia is progressing.  Has daily help now. Stress living with mother.  Hosp for a month 2020 with cardiac and stroke problems.  Minor stroke.  Caretaking mother  Better TMJ with diazepam at HS.  Fear of the dentist now.  Fear of other doctors also. Was taking diazepam 5 hs for sleep.  Then did ok without it and stopped it. Half the time forgets the am dose of fluvoxamine.  Asks what that might do.  Not sure if anxiety or OCD is worse.  Sometimes lethargic.  Able to get out of the house some now with help.  Some germ issues at the grocery store periodically if everything doesn't go just right. Plan: be CW med.  No changes  02/11/20 appt with the following noted: Only taking fluvoxamine 100 mg daily. Vaccinated July 8, but "pushed every button that I got." for OCD.  Hard to make decisions and second guessing himself.   Son Adam Meyers off to [place] and pt got stomach bug a couple of days later and now fatigued.  That also triggered anxiety.  Being in a hotel room triggered him also.   Plan rec fluvoxamine 150 for better control  07/14/2020 appt noted: Never followed through on increasing fluvoxamine to 150 mg daily.  Doesn't like meds.   HA more frequent lately and today. Caretaking wearing him down.  Avoids dentist bc OCD.  Chronic anxiety.  Plan: Rec increase fluvoxamine but he complains grogginess and sex SE at higher  dosage.  Be consistent with 150 mg daily.  He probably will not.  12/08/20 appt noted: [name] Retired. Concerns about bruised leg discussed. General health concerns.   Stayed on Luvox 100 daily since here and never increased. Chronic anxiety and some is somatic. Handled recent OCD trigger OK. Fear of dentist but needs to go. Tired all the time. Neighbors bothering sleep occasionally. Plan no med changes  03/10/21 appt noted: Got to dentist but took everything in me bc phobic.  Abcess and root canal.  July 13.  Then FU for normal exam and gum dz.  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 Asks about dental Rx for Flexeril and meloxicam, if it's OK Son at App and 22yo.    Patient reports stable mood and denies depressed or irritable moods.  Patient denies difficulty with sleep initiation or maintenance. 10-11 hours typical lately.  Likes to sleep a lot.  Meyers energy. Brain won't work with less. Strange dreams.  Denies appetite disturbance.  Patient reports that energy and motivation have been good.  Patient denies any difficulty with concentration.  Patient denies any suicidal ideation.  Sensitive to loud noises.  Never bothered me before my meltdown.  Past Psychiatric Medication Trials: Paxil Sex SE  Afraid of Zoloft bc mother hallucinated on it. Luvox 200 cog SE  pindolol, propranolol, diazepam  Review of Systems:  Review of Systems  Constitutional:  Positive for fatigue.  HENT:  Positive for dental problem. Negative for postnasal drip, rhinorrhea and sneezing.        Jaw pain  Musculoskeletal:  Positive for arthralgias, gait problem and neck pain.  Skin:  Positive for rash.  Neurological:  Positive for headaches. Negative for tremors and weakness.  Psychiatric/Behavioral:  The patient is nervous/anxious.    Medications: I have reviewed the patient's current medications.  Current Outpatient Medications  Medication Sig Dispense Refill   Cholecalciferol (VITAMIN D) 50 MCG  (2000 UT) CAPS Take by mouth.     diazepam (VALIUM) 5 MG tablet TAKE 1 TABLET BY MOUTH THREE TIMES A DAY AS NEEDED FOR ANXIETY 90 tablet 5   famotidine (PEPCID) 20 MG tablet Take 20 mg by mouth daily as needed for heartburn or indigestion.     ibuprofen (ADVIL) 200 MG tablet Take 600 mg by mouth every 8 (eight) hours as needed for moderate pain.      fluvoxaMINE (LUVOX) 100 MG tablet Take 1/2 tablet (50 mg) by mouth every morning and 1 tablet (100 mg) at bedtime 135 tablet 1   No current facility-administered medications for this visit.    Medication Side Effects: None  Allergies:  Allergies  Allergen Reactions   Oxycodone-Acetaminophen Itching   Seasonal Ic [Cholestatin]     Drainage,itchy eyes   Gabapentin Anxiety   Percocet [Oxycodone-Acetaminophen] Itching and Anxiety    Past Medical History:  Diagnosis Date   Compressed cervical disc    Cough    Depression    Hyperlipidemia    Nasal congestion    Social anxiety disorder 04/10/2018    Family History  Problem Relation Age of Onset   Cancer Maternal Grandmother        melanoma    Social History   Socioeconomic History   Marital status: Married    Spouse name: Not on file   Number of children: Not on file   Years of education: Not on file   Highest education level: Not on file  Occupational History   Not on file  Tobacco Use   Smoking status: Never   Smokeless tobacco: Never  Substance and Sexual Activity   Alcohol use: No   Drug use: No   Sexual activity: Not on file  Other Topics Concern   Not on file  Social History Narrative   Not on file   Social Determinants of Health   Financial Resource Strain: Not on file  Food Insecurity: Not on file  Transportation Needs: Not on file  Physical Activity: Not on file  Stress: Not on file  Social Connections: Not on file  Intimate Partner Violence: Not on file    Past Medical History, Surgical history, Social history, and Family history were reviewed and  updated as appropriate.   Please see review of systems for further details on the patient's review from today.   Objective:   Physical Exam:  There were no vitals taken for this visit.  Physical Exam Constitutional:      General: He is not in acute distress. Musculoskeletal:        General: No deformity.  Neurological:     Mental Status: He is alert and oriented to person, place, and time.     Cranial Nerves: No dysarthria.     Coordination: Coordination normal.  Psychiatric:        Attention and Perception: Attention and perception  normal. He does not perceive auditory or visual hallucinations.        Mood and Affect: Mood is anxious. Mood is not depressed. Affect is not labile, blunt, angry or inappropriate.        Speech: Speech normal.        Behavior: Behavior normal. Behavior is cooperative.        Thought Content: Thought content normal. Thought content is not paranoid or delusional. Thought content does not include homicidal or suicidal ideation. Thought content does not include homicidal or suicidal plan.        Cognition and Memory: Cognition and memory normal.        Judgment: Judgment normal.     Comments: Residual chronic OCD but no worse. It still interferes with function and QOL markedly. Chronically fearful of med change. 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 We discussed Adam Meyers has a long history of OCD and a history of depression as well and first came to our practice and April 2014.  He continues in therapy with Dr. Amado Coe.  While he has made a lot of progress and is much less distressed by his OCD.  He remains disabled because of time requirements of the OCD and he is easily triggered into compulsive rituals with high levels of anxiety.  With his limited life demands and relative isolation he manages acceptably well.  He does not want to take take more SSRI medication because of sexual side effects are worse at higher dosages.  Supportive therapy dealing with stressors of care taking mother and how that plays into fears of med changes.   Also CBT around facing dental phobia.   Last med change was very difficult for him so he doesn't want to pursue it.  Disc his questions around meds RX for dental problems.  Needed reassurance.     Continue meds.  Benefit from meds. Rec Cont it longterm bc high relapse risk.  Emphasized need for consistency.   Answered his questions about need for it longterm medication given severity of his sx historically.    Rec increase fluvoxamine but he complains grogginess and sex SE at higher dosage.  Be consistent with 150 mg daily.  He probably will not. He's stayed on 100 mg daily. Extensive discussion of dx OCD and will likely need meds for OCD for the rest of his life DT disability of OCD.  Option change SSRI to Prozac to reduce fatigue.  Rec it.  He's afraid of Zoloft bc mother had a bad  time with it.  He's afraid of switching  meds.  Continue diazepam 5 mg HS   Can use prn for dentist.  Disc SE. Can take extra to go to dentist. But dont' drive if drowsy.  Disc dental phobia at length.  FU 6 mos    Meredith Staggers, MD, DFAPA   Please see After Visit Summary for patient specific instructions.  Future Appointments  Date Time Provider Department Center  03/17/2021  3:00 PM [name], PhD CP-[facility] None  04/07/2021  3:00 PM [name], PhD [facility] None  04/28/2021  3:00 PM [name], PhD CP-[facility] None  05/26/2021  3:00 PM [name], PhD CP[facility] None    No orders of the defined types were placed in this encounter.     -------------------------------

## 2021-03-15 ENCOUNTER — Telehealth: Payer: Self-pay | Admitting: Psychiatry

## 2021-03-15 NOTE — Telephone Encounter (Signed)
Pt wants to know if any of his meds from you are possibly reacting with the cyclobenzaprine.He stated he is very groggy still and did not want to drive because of this.

## 2021-03-15 NOTE — Telephone Encounter (Signed)
Next visit is 03/17/21. Infant called and said that the Cyclobenzaprine 10 mg is turning him into a medicine head. His side effects are sleepy, dry mouth and he doesn't feel right. He is taking this medicine for the first time. Could someone please call him at [phone]. Pharmacy is:  [id number] [place], [place] - [address]  Phone:  [phone]  Fax:  [phone]

## 2021-03-16 NOTE — Telephone Encounter (Signed)
Cut the dosage in half.  If still not tolerated then he'll need to stop it.

## 2021-03-17 ENCOUNTER — Other Ambulatory Visit: Payer: Self-pay

## 2021-03-17 ENCOUNTER — Ambulatory Visit (INDEPENDENT_AMBULATORY_CARE_PROVIDER_SITE_OTHER): Payer: Medicare HMO | Admitting: Psychiatry

## 2021-03-17 DIAGNOSIS — G988 Other disorders of nervous system: Secondary | ICD-10-CM

## 2021-03-17 DIAGNOSIS — M26609 Unspecified temporomandibular joint disorder, unspecified side: Secondary | ICD-10-CM | POA: Diagnosis not present

## 2021-03-17 DIAGNOSIS — R5382 Chronic fatigue, unspecified: Secondary | ICD-10-CM

## 2021-03-17 DIAGNOSIS — F422 Mixed obsessional thoughts and acts: Secondary | ICD-10-CM | POA: Diagnosis not present

## 2021-03-17 DIAGNOSIS — G9332 Myalgic encephalomyelitis/chronic fatigue syndrome: Secondary | ICD-10-CM

## 2021-03-17 DIAGNOSIS — F4010 Social phobia, unspecified: Secondary | ICD-10-CM

## 2021-03-17 DIAGNOSIS — F40232 Fear of other medical care: Secondary | ICD-10-CM

## 2021-03-17 DIAGNOSIS — F331 Major depressive disorder, recurrent, moderate: Secondary | ICD-10-CM

## 2021-03-17 NOTE — Telephone Encounter (Signed)
Pt informed

## 2021-03-17 NOTE — Progress Notes (Signed)
Psychotherapy Progress Note [facility] [name], PhD LP  Patient ID: Adam Meyers     MRN: [id number] Therapy format: Individual psychotherapy Date: 03/17/2021      Start: 3:16p     Stop: 4:06p     Time Spent: 50 min Location: In-person   Session narrative (presenting needs, interim history, self-report of stressors and symptoms, applications of prior therapy, status changes, and interventions made in session) F/u to root canal, ongoing sensitivity to cold, pretty clear some of it is referred pain.  Getting attentive service from oral surgeon.  Keeping a detailed diary of symptoms for the specialist, in hopes of making sure to keep his remaining teeth.  Oct 5 f/u to replace a temp filling.  Has need of gum work, deep cleaning as well and is apprehensive, particularly about aggravating TMJ.  Led to remember his good reasons for getting the work done, and imagine tolerating well enough.  Meloxicam working for TMJ pain, though it gives him a spacey feeling (psychogenic?).  Tried muscle relaxer (QHS) but felt too spacey in the morning, with extended drowsiness.  Med advice from psychiatry is to use a full Valium.  Educated on parasympathetic rebound and to give himself a more extended test of side effects if antiinflammatory and muscle relaxer are valuable enough to him.  Late morning sleep spoiled by construction up the street.  Encouraged to try to better match the day anyway, for antidepressant benefit, but he is long accustomed now to sleeping in till midday and resists.   "Serendipity" this morning re OCD -- was not able to clean his bathroom due to his neck condition but let a housekeeper come in to do it.  This is both foreign to his fear of intrusion and contamination and early for his sensitive daily rhythm, as well as his own bathroom, about which he is more sensitive.  Significant victory letting her in, and good practice normalizing vs. excessive  rules.  Therapeutic modalities: Cognitive Behavioral Therapy and Solution-Oriented/Positive Psychology  Mental Status/Observations:  Appearance:   Casual     Behavior:  Appropriate  Motor:  Normal  Speech/Language:   Clear and Coherent  Affect:  Appropriate  Mood:  anxious and dysthymic  Thought process:  normal  Thought content:    obsessions  Sensory/Perceptual disturbances:    WNL  Orientation:  Fully oriented  Attention:  Good    Concentration:  Good  Memory:  WNL  Insight:    Fair  Judgment:   Good  Impulse Control:  Good   Risk Assessment: Danger to Self: No Self-injurious Behavior: No Danger to Others: No Physical Aggression / Violence: No Duty to Warn: No Access to Firearms a concern: No  Assessment of progress:  stabilized  Diagnosis:   ICD-10-CM   1. 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 Social anxiety disorder  F40.10     3. Major depressive disorder, recurrent episode, moderate (HCC)  F33.1     4. 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     5. 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 Chronic fatigue syndrome  R53.82      Plan:  Use sedating meds as directed, and don't jump to conclusions about med SE when some of his drowsiness may be about a deprived nervous system catching up Still worth moving up wake time, better observe circadian rhythm, light timing, and  wakefulness for mood support Communicate adequately with dentist about coping assistance and TMJ fear Continued encouragement to diversify diet -- still probably operating in nutritional deficiencies longterm Continued encouragement to decatastrophize jaw pain and manage more than fear  Where able and needed, face fears and dismantle overcautious handling of OCD challenges Other recommendations/advice as may be noted above Continue to utilize previously learned skills ad lib Maintain medication as prescribed and work faithfully with relevant  prescriber(s) if any changes are desired or seem indicated Call the clinic on-call service, 988/hotline, present to ER, or call 911 if any life-threatening psychiatric crisis Return for session(s) already scheduled. Already scheduled visit in this office 04/07/2021.  [name], PhD [name], PhD LP Clinical Psychologist, [facility] [address] [place], [place] [address] [phone]

## 2021-03-22 DIAGNOSIS — M542 Cervicalgia: Secondary | ICD-10-CM | POA: Diagnosis not present

## 2021-03-22 DIAGNOSIS — M6281 Muscle weakness (generalized): Secondary | ICD-10-CM | POA: Diagnosis not present

## 2021-03-22 DIAGNOSIS — M2560 Stiffness of unspecified joint, not elsewhere classified: Secondary | ICD-10-CM | POA: Diagnosis not present

## 2021-03-22 DIAGNOSIS — R519 Headache, unspecified: Secondary | ICD-10-CM | POA: Diagnosis not present

## 2021-03-22 DIAGNOSIS — R262 Difficulty in walking, not elsewhere classified: Secondary | ICD-10-CM | POA: Diagnosis not present

## 2021-03-22 DIAGNOSIS — M545 Low back pain, unspecified: Secondary | ICD-10-CM | POA: Diagnosis not present

## 2021-03-22 DIAGNOSIS — S39012D Strain of muscle, fascia and tendon of lower back, subsequent encounter: Secondary | ICD-10-CM | POA: Diagnosis not present

## 2021-03-31 DIAGNOSIS — R519 Headache, unspecified: Secondary | ICD-10-CM | POA: Diagnosis not present

## 2021-03-31 DIAGNOSIS — M2560 Stiffness of unspecified joint, not elsewhere classified: Secondary | ICD-10-CM | POA: Diagnosis not present

## 2021-03-31 DIAGNOSIS — S39012D Strain of muscle, fascia and tendon of lower back, subsequent encounter: Secondary | ICD-10-CM | POA: Diagnosis not present

## 2021-03-31 DIAGNOSIS — M545 Low back pain, unspecified: Secondary | ICD-10-CM | POA: Diagnosis not present

## 2021-03-31 DIAGNOSIS — M6281 Muscle weakness (generalized): Secondary | ICD-10-CM | POA: Diagnosis not present

## 2021-03-31 DIAGNOSIS — M542 Cervicalgia: Secondary | ICD-10-CM | POA: Diagnosis not present

## 2021-03-31 DIAGNOSIS — R262 Difficulty in walking, not elsewhere classified: Secondary | ICD-10-CM | POA: Diagnosis not present

## 2021-04-05 DIAGNOSIS — M2560 Stiffness of unspecified joint, not elsewhere classified: Secondary | ICD-10-CM | POA: Diagnosis not present

## 2021-04-05 DIAGNOSIS — M6281 Muscle weakness (generalized): Secondary | ICD-10-CM | POA: Diagnosis not present

## 2021-04-05 DIAGNOSIS — M545 Low back pain, unspecified: Secondary | ICD-10-CM | POA: Diagnosis not present

## 2021-04-05 DIAGNOSIS — M542 Cervicalgia: Secondary | ICD-10-CM | POA: Diagnosis not present

## 2021-04-05 DIAGNOSIS — S39012D Strain of muscle, fascia and tendon of lower back, subsequent encounter: Secondary | ICD-10-CM | POA: Diagnosis not present

## 2021-04-05 DIAGNOSIS — R262 Difficulty in walking, not elsewhere classified: Secondary | ICD-10-CM | POA: Diagnosis not present

## 2021-04-05 DIAGNOSIS — R519 Headache, unspecified: Secondary | ICD-10-CM | POA: Diagnosis not present

## 2021-04-07 ENCOUNTER — Other Ambulatory Visit: Payer: Self-pay

## 2021-04-07 ENCOUNTER — Ambulatory Visit (INDEPENDENT_AMBULATORY_CARE_PROVIDER_SITE_OTHER): Payer: Medicare HMO | Admitting: Psychiatry

## 2021-04-07 DIAGNOSIS — F422 Mixed obsessional thoughts and acts: Secondary | ICD-10-CM

## 2021-04-07 DIAGNOSIS — F4010 Social phobia, unspecified: Secondary | ICD-10-CM | POA: Diagnosis not present

## 2021-04-07 DIAGNOSIS — M26609 Unspecified temporomandibular joint disorder, unspecified side: Secondary | ICD-10-CM

## 2021-04-07 DIAGNOSIS — G4720 Circadian rhythm sleep disorder, unspecified type: Secondary | ICD-10-CM | POA: Diagnosis not present

## 2021-04-07 DIAGNOSIS — G988 Other disorders of nervous system: Secondary | ICD-10-CM

## 2021-04-07 DIAGNOSIS — F331 Major depressive disorder, recurrent, moderate: Secondary | ICD-10-CM | POA: Diagnosis not present

## 2021-04-07 DIAGNOSIS — F40232 Fear of other medical care: Secondary | ICD-10-CM | POA: Diagnosis not present

## 2021-04-07 NOTE — Progress Notes (Signed)
Psychotherapy Progress Note [facility] [name], PhD LP  Patient ID: Adam Meyers)    MRN: [id number] Therapy format: Individual psychotherapy Date: 04/07/2021      Start: 3:07p     Stop: 3:57p     Time Spent: 50 min Location: In-person   Session narrative (presenting needs, interim history, self-report of stressors and symptoms, applications of prior therapy, status changes, and interventions made in session) Did pretty well at dental followup, addressing aftermath of root canal and temp filling on tooth #9.  Got perm filling from endodontist after cold-testing confirmed no actual feeling in that tooth.  Half Valium worked for anxiety, and despite his doubts, he did not need Novocain to fill it, had the experience she (endo) had advertised, plus he was helped by nitrous and her bedside manner.  Next procedure 10/26, the first of 4 1-quadrant deep cleanings.  Has advice to premedicate for pain, but concerned whether it's OK to take diazepam and Meloxicam together.  He has not retried the half-dose of cyclobenzaprine half dose, got too scared of further SE.  Encouraged to still try the half-dose recommendation and see, both for jaw relief and for practice approaching and suspending judgment with anxiety situations.  Apprehensive about next procedure, acknowledges he feels compelled to shower (though not prolonged) after any physical-exam involving health care visit.  Allowed that he may, not a high priority to attack OCD on this front, but definitely follow through addressing dental neglect and be clear as needed securing understanding of anxiety and TMJ fears with providers who are not privy to his history.  Approaching anxiety with wedding this Saturday of his A Gavin Pound (only 7 yrs older).  Anticipates TMJ flareup just from all the talking that will be involved.  [name] is on a racquetball team playing this weekend, sorry to miss it, glad he is enjoying the sport and  camaraderie.   [name] is in PT now, with hope if reducing upper back tension and pain.  At home [name] is doing grocery shopping for the household, and some appts for mother, all of which challenge his capacities for stamina, coping with public places and perceived risk, and his sense of fair division of labor with sister.  Encouraged to continue upholding these, both as normalizing for anxiety and chronic pain, and as good optics with sister.  Therapeutic modalities: Cognitive Behavioral Therapy and Solution-Oriented/Positive Psychology  Mental Status/Observations:  Appearance:   Casual     Behavior:  Appropriate  Motor:  Normal  Speech/Language:   Clear and Coherent  Affect:  Appropriate  Mood:  anxious  Thought process:  normal  Thought content:    Obsessions  Sensory/Perceptual disturbances:    WNL  Orientation:  Fully oriented  Attention:  Good    Concentration:  Good  Memory:  WNL  Insight:    Fair  Judgment:   Good  Impulse Control:  Good   Risk Assessment: Danger to Self: No Self-injurious Behavior: No Danger to Others: No Physical Aggression / Violence: No Duty to Warn: No Access to Firearms a concern: No  Assessment of progress:  progressing  Diagnosis:   ICD-10-CM   1. 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 Social anxiety disorder  F40.10     3. Major depressive disorder, recurrent episode, moderate (HCC)  F33.1     4. 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     5. 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  Where possible, embrace occasions as opportunities for good work reducing OCD and living up to his values. Still worth moving up wake time, better observe circadian rhythm, light timing, and wakefulness for mood support Continued encouragement to diversify diet -- still probably operating in nutritional deficiencies longterm Continued encouragement to decatastrophize jaw pain and manage more than fear it Where able and needed, face fears and dismantle overcautious handling of OCD challenges Other recommendations/advice as may be noted above Continue to utilize previously learned skills ad lib Maintain medication as prescribed and work faithfully with relevant prescriber(s) if any changes are desired or seem indicated Call the clinic on-call service, 988/hotline, present to ER, or call 911 if any life-threatening psychiatric crisis Return for time as available. Already scheduled visit in this office 04/28/2021.  [name], PhD [name], PhD LP Clinical Psychologist, [facility] [address], [address] [place], [place] [address] [phone]

## 2021-04-12 DIAGNOSIS — M542 Cervicalgia: Secondary | ICD-10-CM | POA: Diagnosis not present

## 2021-04-12 DIAGNOSIS — R262 Difficulty in walking, not elsewhere classified: Secondary | ICD-10-CM | POA: Diagnosis not present

## 2021-04-12 DIAGNOSIS — M2560 Stiffness of unspecified joint, not elsewhere classified: Secondary | ICD-10-CM | POA: Diagnosis not present

## 2021-04-12 DIAGNOSIS — M6281 Muscle weakness (generalized): Secondary | ICD-10-CM | POA: Diagnosis not present

## 2021-04-12 DIAGNOSIS — R519 Headache, unspecified: Secondary | ICD-10-CM | POA: Diagnosis not present

## 2021-04-12 DIAGNOSIS — S39012D Strain of muscle, fascia and tendon of lower back, subsequent encounter: Secondary | ICD-10-CM | POA: Diagnosis not present

## 2021-04-12 DIAGNOSIS — M545 Low back pain, unspecified: Secondary | ICD-10-CM | POA: Diagnosis not present

## 2021-04-28 ENCOUNTER — Ambulatory Visit (INDEPENDENT_AMBULATORY_CARE_PROVIDER_SITE_OTHER): Payer: Medicare HMO | Admitting: Psychiatry

## 2021-04-28 ENCOUNTER — Other Ambulatory Visit: Payer: Self-pay

## 2021-04-28 DIAGNOSIS — G4720 Circadian rhythm sleep disorder, unspecified type: Secondary | ICD-10-CM | POA: Diagnosis not present

## 2021-04-28 DIAGNOSIS — F4010 Social phobia, unspecified: Secondary | ICD-10-CM

## 2021-04-28 DIAGNOSIS — G988 Other disorders of nervous system: Secondary | ICD-10-CM | POA: Diagnosis not present

## 2021-04-28 DIAGNOSIS — Z636 Dependent relative needing care at home: Secondary | ICD-10-CM | POA: Diagnosis not present

## 2021-04-28 DIAGNOSIS — F331 Major depressive disorder, recurrent, moderate: Secondary | ICD-10-CM | POA: Diagnosis not present

## 2021-04-28 DIAGNOSIS — F422 Mixed obsessional thoughts and acts: Secondary | ICD-10-CM

## 2021-04-28 DIAGNOSIS — M26609 Unspecified temporomandibular joint disorder, unspecified side: Secondary | ICD-10-CM

## 2021-04-28 DIAGNOSIS — F40232 Fear of other medical care: Secondary | ICD-10-CM | POA: Diagnosis not present

## 2021-04-28 DIAGNOSIS — Z638 Other specified problems related to primary support group: Secondary | ICD-10-CM | POA: Diagnosis not present

## 2021-04-28 NOTE — Progress Notes (Signed)
Psychotherapy Progress Note [facility] [name], PhD LP  Patient ID: Adam Meyers)    MRN: [id number] Therapy format: Individual psychotherapy Date: 04/28/2021      Start: 3:20p     Stop: 4:10p     Time Spent: 50 min Location: In-person   Session narrative (presenting needs, interim history, self-report of stressors and symptoms, applications of prior therapy, status changes, and interventions made in session) "Something's got a hold of me and I can't sleep."  Trembling presently.  Had dental procedure last week (his 2nd installment for deep cleaning), and it went OK, but afterward the oral rinsing and very sore jaw were hard to contend with.  Monday he called dentist about both of his TMJs in pain and a headache across his forehead, asking to push out the next cleaning appt in order to recover further.  In the process got next week's needed/unwanted appt cancelled.  Would rather get it over with, actually, but now scheduling fouled up.  Support/empathy provided and strategized about communicating further with dental provider, both about clarifying his wishes and trying to get solid advice about pain management.  Encouraged not to catastrophize about TMJs, allow jaw to relax, don't have to talk too much while here.  Does not yet have a new regular physician after [name] retired .  Assigned to a PA, but also has a current problem of PT having run out, and insistence that he have a physical before getting more services.  Educated further on how PT requires physician authorizations, so yes, it really does take meeting a new doctor to renew what the old doctor did.  For sleep, reviewed his use of sedative and his current Rx clearance (up to 3/d Valium) as room enough to let himself have more for insomnia than he currently uses.    New flareup with sister over perceived fairness in their roles taking care of mother.  Flashing resentment toward Anthem relating it  here, supportively challenged about reading in motives/agenda, encouraged open-minded communication and perception-checking and self-reminding that he doesn't have to fight things she might mean but does not say.  Therapeutic modalities: Cognitive Behavioral Therapy and Solution-Oriented/Positive Psychology  Mental Status/Observations:  Appearance:   Casual     Behavior:  Appropriate  Motor:  Normal, restrained  Speech/Language:   Clear and Coherent  Affect:  Appropriate  Mood:  anxious  Thought process:  normal  Thought content:    Obsessions  Sensory/Perceptual disturbances:    WNL  Orientation:  Fully oriented  Attention:  Good    Concentration:  Good  Memory:  WNL  Insight:    Fair  Judgment:   Good  Impulse Control:  Good   Risk Assessment: Danger to Self: No Self-injurious Behavior: No Danger to Others: No Physical Aggression / Violence: No Duty to Warn: No Access to Firearms a concern: No  Assessment of progress:  situational setback(s)  Diagnosis:   ICD-10-CM   1. 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 Social anxiety disorder  F40.10     4. Major depressive disorder, recurrent episode, moderate (HCC)  F33.1     5. 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 Relationship problem with family member  Z4.8  9. Caregiver stress  Z63.6      Plan:  OK to increase diazepam for bedtime or middle of the night needs, in keeping with Dr. Alwyn Ren advice Practice trusting that his gum disease will not be catastrophic during the 26-month wait he may now have and that he is doing the right things to try to move it up.  If he can restore earlier service and get through it, better for anxiety/worry sake. Continue following through on needed health care, using sedating meds as directed, working trust and clear communication with  providers -- including dental, orthopedic, primary, and psychiatry Encourage to be content in current roles shopping for the household and accompanying mother to health care visits and work tolerance for anxiety and chronic pain to fulfill as more normalized.  Where possible, embrace occasions as opportunities for good work reducing OCD and living up to his values. Practice contentment with sister, OK to beg mercy instead of being defensive -- in present condition, will work better than getting prickly with herStill worth moving up wake time, better observe circadian rhythm, light timing, and wakefulness for mood support Continued encouragement to diversify diet -- still probably operating in nutritional deficiencies longterm Continued encouragement to decatastrophize jaw pain and manage more than fear it Where able and needed, face fears and dismantle overcautious handling of OCD challenges Other recommendations/advice as may be noted above Continue to utilize previously learned skills ad lib Maintain medication as prescribed and work faithfully with relevant prescriber(s) if any changes are desired or seem indicated Call the clinic on-call service, 988/hotline, present to ER, or call 911 if any life-threatening psychiatric crisis Return for session(s) already scheduled. Already scheduled visit in this office 05/26/2021.  [name], PhD [name], PhD LP Clinical Psychologist, [facility] [address] [place], [place] [address] [phone]

## 2021-05-26 ENCOUNTER — Ambulatory Visit: Payer: Medicare HMO | Admitting: Psychiatry

## 2021-06-15 ENCOUNTER — Ambulatory Visit: Payer: Medicare HMO | Admitting: Psychiatry

## 2021-06-15 ENCOUNTER — Other Ambulatory Visit: Payer: Self-pay

## 2021-06-15 DIAGNOSIS — G4720 Circadian rhythm sleep disorder, unspecified type: Secondary | ICD-10-CM

## 2021-06-15 DIAGNOSIS — E639 Nutritional deficiency, unspecified: Secondary | ICD-10-CM

## 2021-06-15 DIAGNOSIS — F331 Major depressive disorder, recurrent, moderate: Secondary | ICD-10-CM

## 2021-06-15 DIAGNOSIS — F4010 Social phobia, unspecified: Secondary | ICD-10-CM

## 2021-06-15 DIAGNOSIS — F40232 Fear of other medical care: Secondary | ICD-10-CM | POA: Diagnosis not present

## 2021-06-15 DIAGNOSIS — G8929 Other chronic pain: Secondary | ICD-10-CM

## 2021-06-15 DIAGNOSIS — M549 Dorsalgia, unspecified: Secondary | ICD-10-CM

## 2021-06-15 DIAGNOSIS — G9332 Myalgic encephalomyelitis/chronic fatigue syndrome: Secondary | ICD-10-CM

## 2021-06-15 DIAGNOSIS — G988 Other disorders of nervous system: Secondary | ICD-10-CM | POA: Diagnosis not present

## 2021-06-15 DIAGNOSIS — F422 Mixed obsessional thoughts and acts: Secondary | ICD-10-CM | POA: Diagnosis not present

## 2021-06-15 DIAGNOSIS — M26609 Unspecified temporomandibular joint disorder, unspecified side: Secondary | ICD-10-CM

## 2021-06-15 NOTE — Progress Notes (Signed)
Psychotherapy Progress Note [facility] [name], PhD LP  Patient ID: Adam Meyers)    MRN: [id number] Therapy format: Individual psychotherapy Date: 06/15/2021      Start: 3:18p     Stop: 4:08p     Time Spent: 50 min Location: In-person   Session narrative (presenting needs, interim history, self-report of stressors and symptoms, applications of prior therapy, status changes, and interventions made in session) 6 weeks since last seen, last appt prevented by GI illness.  Made it through extensive dental work, but it did flare up his TMJ.  Oral surgeon Rx'd Meloxicam, which works well, but she can't write an extended Rx.  Today in enough pain down to his chin.  Just speaking aggravates it.  Has lost 14 lbs since the first dental work, largely for avoiding chewing and swallowing.  Discussed food options, dislikes common soft foods like yogurt, applesauce, bananas.  Mashed potatoes, veg soup, rolls dipped in soup are largely what he subsists on now.  Out of PT right now for expired authorization, which requires a new physical, with a new physician and new nurse, both of which are very daunting to him in present state.  Encouraged to press through anyway, that he will get the PT he wants and knows he needs once he accomplishes these prerequisites.  Meanwhile, pain mounting in shoulder and neck from not being  in treatment, meds are inadequate to resolve suspected chronic tension pain and muscle inflammation, and latest PT he felt confident with has moved on a well.  Knows he needs to schedule these, and other trusted doctors like Dr. Jennelle Human cannot legitimately prescribe in this area, but putting off scheduling for feeling wiped out, even though he badly wants the antiinflammatory and PT reauthorized.    Holiday plan for Adam Meyers here until 12/24, Adam Meyers coming to attend church service, then sending Adam Meyers back for a few more days.  She has been helpful for holiday so far,  buying and wrapping his gifts for kids.  No issues noted.  Therapeutic modalities: Cognitive Behavioral Therapy, Solution-Oriented/Positive Psychology, and Ego-Supportive  Mental Status/Observations:  Appearance:   Casual     Behavior:  Rigid  Motor:  Dysarthric, tense throughout jaw and neck  Speech/Language:   Quieter, more restrained articulation  Affect:  Constricted  Mood:  anxious  Thought process:  normal  Thought content:    Obsessions  Sensory/Perceptual disturbances:    WNL and possibly amplifying somatic issues, hypervigilant  Orientation:  Fully oriented  Attention:  Good    Concentration:  Fair  Memory:  grossly intact  Insight:    Fair  Judgment:   Fair  Impulse Control:  Good   Risk Assessment: Danger to Self: No Self-injurious Behavior: No Danger to Others: No Physical Aggression / Violence: No Duty to Warn: No Access to Firearms a concern: No  Assessment of progress:  stabilized  Diagnosis:   ICD-10-CM   1. 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 Social anxiety disorder  F40.10     3. 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 Major depressive disorder, recurrent episode, moderate (HCC)  F33.1     5. 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 Chronic fatigue syndrome  G93.32     7. 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 Chronic back pain, unspecified back location, unspecified back pain laterality  M54.9    G89.29  9. Nutritional problems  E63.9     10. 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  If pricey, recheck whether SNAP or another fixed-income service benefit may  reduce the price Re. caregiving, encourage content in current roles and view shopping and chaperone tasks with mother as opportunities to improve his own tolerance for anxiety and chronic pain, better master OCD, and live up to his values. Practice contentment with sister, OK to beg mercy instead of being defensive -- in present condition, will work better than getting prickly with her Re. circadian disruption, still worth moving up wake time, up time, and light timing for mood support Continued encouragement to decatastrophize jaw pain and manage more than fear it Other recommendations/advice as may be noted above Continue to utilize previously learned skills ad lib Maintain medication as prescribed and work faithfully with relevant prescriber(s) if any changes are desired or seem indicated Call the clinic on-call service, 988/hotline, 911, or present to [facility] or ER if any life-threatening psychiatric crisis Return for time as available, session(s) already scheduled. Already scheduled visit in this office 07/07/2021.  [name], PhD [name], PhD LP Clinical Psychologist, [facility] [address] [place], [place] [address] [phone]

## 2021-07-07 ENCOUNTER — Ambulatory Visit: Payer: Medicare HMO | Admitting: Psychiatry

## 2021-07-07 ENCOUNTER — Other Ambulatory Visit: Payer: Self-pay

## 2021-07-07 DIAGNOSIS — G988 Other disorders of nervous system: Secondary | ICD-10-CM | POA: Diagnosis not present

## 2021-07-07 DIAGNOSIS — M549 Dorsalgia, unspecified: Secondary | ICD-10-CM | POA: Diagnosis not present

## 2021-07-07 DIAGNOSIS — F331 Major depressive disorder, recurrent, moderate: Secondary | ICD-10-CM

## 2021-07-07 DIAGNOSIS — G8929 Other chronic pain: Secondary | ICD-10-CM

## 2021-07-07 DIAGNOSIS — F40232 Fear of other medical care: Secondary | ICD-10-CM | POA: Diagnosis not present

## 2021-07-07 DIAGNOSIS — G9332 Myalgic encephalomyelitis/chronic fatigue syndrome: Secondary | ICD-10-CM

## 2021-07-07 DIAGNOSIS — F422 Mixed obsessional thoughts and acts: Secondary | ICD-10-CM

## 2021-07-07 DIAGNOSIS — M26609 Unspecified temporomandibular joint disorder, unspecified side: Secondary | ICD-10-CM

## 2021-07-07 DIAGNOSIS — F4010 Social phobia, unspecified: Secondary | ICD-10-CM | POA: Diagnosis not present

## 2021-07-07 DIAGNOSIS — Z636 Dependent relative needing care at home: Secondary | ICD-10-CM

## 2021-07-07 DIAGNOSIS — E639 Nutritional deficiency, unspecified: Secondary | ICD-10-CM

## 2021-07-07 NOTE — Progress Notes (Signed)
Psychotherapy Progress Note [facility] Luan Moore, PhD LP  Patient ID: OSCER LESSARD)    MRN: [id number] Therapy format: Individual psychotherapy Date: 07/07/2021      Start: 3:23p     Stop: 4:12p     Time Spent: 49 min Location: In-person   Session narrative (presenting needs, interim history, self-report of stressors and symptoms, applications of prior therapy, status changes, and interventions made in session) Made it back to [place], an accomplishment over illness and anxiety.  Adequate time with [name], no issues with [name], just wished he had more time with [name] in the shuffle between [place] and here.    Remains in significant, chronic pain with jaw and headache since undertaking dental work.  Needs pain relief, figures Meloxicam, which dentist won't prescribe, referred back to PCP.  Made appt, going next Thursday.  Discussed need to renew physical therapy as well to manage upper back and neck pain, as well as possible help for TMJ.  Seemed like he may have lowballed his needs in contacting PCP office but eventually confirms he did manage to get them across.  Obviously uncomfortable for him to do.  Acknowledged phobia of reliving the TMJ ordeal that led to him becoming depressed, emaciated, unemployed, and divorced, and which he frankly and persistently blames on the chiropractor who exacerbated the condition.  New issue with mother's caregiver, who seemed to balk at being asked to wipe out the kitchen sink.  Based on experience with the previous one, he thought it was automatic and expectable, but it seems to be an issue of perceived ground rules for domestic service.  Educated him on the ground rules personal caregiver services have to watch and the likelihood that he was inadvertently coming across as entitled.  Alludes to anger in dealing with this, but encouraged to harness that and trust it's a matter of erroneous impressions and unclear ground  rules, and he can get it worked out if he keeps his calm and checks perceptions instead of going defensive or silently resentful about it.  Toward the PCP visit, discussed his anxiety about meeting a new provider and going through vulnerable, embarrassing procedures.  Provided advice on request how to approach it.  Therapeutic modalities: Cognitive Behavioral Therapy, Solution-Oriented/Positive Psychology, and Assertiveness/Communication  Mental Status/Observations:  Appearance:   Casual and Neat     Behavior:  Appropriate  Motor:  Normal and tense  Speech/Language:   Clear and Coherent  Affect:  Congruent  Mood:  anxious and tense  Thought process:  normal  Thought content:    Obsessions  Sensory/Perceptual disturbances:    WNL and chronic pain  Orientation:  Fully oriented  Attention:  Good    Concentration:  Good  Memory:  WNL  Insight:    Fair  Judgment:   Good  Impulse Control:  Good   Risk Assessment: Danger to Self: No Self-injurious Behavior: No Danger to Others: No Physical Aggression / Violence: No Duty to Warn: No Access to Firearms a concern: No  Assessment of progress:  stabilized  Diagnosis:   ICD-10-CM   1. 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 Social anxiety disorder  F40.10     3. 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 Major depressive disorder, recurrent episode, moderate (HCC)  F33.1     5. 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 Chronic back pain, unspecified back location, unspecified  back pain laterality  M54.9    G89.29     7. Chronic fatigue syndrome  G93.32     8. 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 Caregiver stress  Z63.6     10. Nutritional problems  E63.9      Plan:  Follow through with meeting new PCP and asking appropriately for pain relief and PT.  Practice trust and being forthcoming about anxiety, history, and need. Guard against hostile assumptions in dealing with domestic care worker, be willing to ask about  ground rules for housekeeping and negotiate, not complain Continued encouragement to diversify diet -- still probably operating in nutritional deficiencies longterm Continued encouragement to decatastrophize jaw pain and manage more than fear  Where able and needed, face fears and dismantle overcautious handling of OCD challenges Continued encouragement to start the day earlier Other recommendations/advice as may be noted above Continue to utilize previously learned skills ad lib Maintain medication as prescribed and work faithfully with relevant prescriber(s) if any changes are desired or seem indicated Call the clinic on-call service, 988/hotline, 911, or present to [facility] or ER if any life-threatening psychiatric crisis Return for time as available. Already scheduled visit in this office 07/28/2021.  Blanchie Serve, PhD Luan Moore, PhD LP Clinical Psychologist, [facility] [address], [place], [place] [address] [phone]

## 2021-07-14 DIAGNOSIS — R5383 Other fatigue: Secondary | ICD-10-CM | POA: Diagnosis not present

## 2021-07-14 DIAGNOSIS — M25551 Pain in right hip: Secondary | ICD-10-CM | POA: Diagnosis not present

## 2021-07-14 DIAGNOSIS — Z79899 Other long term (current) drug therapy: Secondary | ICD-10-CM | POA: Diagnosis not present

## 2021-07-14 DIAGNOSIS — M2669 Other specified disorders of temporomandibular joint: Secondary | ICD-10-CM | POA: Diagnosis not present

## 2021-07-14 DIAGNOSIS — Z1211 Encounter for screening for malignant neoplasm of colon: Secondary | ICD-10-CM | POA: Diagnosis not present

## 2021-07-14 DIAGNOSIS — M542 Cervicalgia: Secondary | ICD-10-CM | POA: Diagnosis not present

## 2021-07-14 DIAGNOSIS — Z791 Long term (current) use of non-steroidal anti-inflammatories (NSAID): Secondary | ICD-10-CM | POA: Diagnosis not present

## 2021-07-26 DIAGNOSIS — M542 Cervicalgia: Secondary | ICD-10-CM | POA: Diagnosis not present

## 2021-07-26 DIAGNOSIS — M5416 Radiculopathy, lumbar region: Secondary | ICD-10-CM | POA: Diagnosis not present

## 2021-07-26 DIAGNOSIS — M5412 Radiculopathy, cervical region: Secondary | ICD-10-CM | POA: Diagnosis not present

## 2021-07-27 DIAGNOSIS — M542 Cervicalgia: Secondary | ICD-10-CM | POA: Diagnosis not present

## 2021-07-27 DIAGNOSIS — M5416 Radiculopathy, lumbar region: Secondary | ICD-10-CM | POA: Diagnosis not present

## 2021-07-27 DIAGNOSIS — M5412 Radiculopathy, cervical region: Secondary | ICD-10-CM | POA: Diagnosis not present

## 2021-07-28 ENCOUNTER — Ambulatory Visit: Payer: Medicare HMO | Admitting: Psychiatry

## 2021-08-02 DIAGNOSIS — M5416 Radiculopathy, lumbar region: Secondary | ICD-10-CM | POA: Diagnosis not present

## 2021-08-02 DIAGNOSIS — M5412 Radiculopathy, cervical region: Secondary | ICD-10-CM | POA: Diagnosis not present

## 2021-08-02 DIAGNOSIS — M542 Cervicalgia: Secondary | ICD-10-CM | POA: Diagnosis not present

## 2021-08-04 DIAGNOSIS — M542 Cervicalgia: Secondary | ICD-10-CM | POA: Diagnosis not present

## 2021-08-04 DIAGNOSIS — M5416 Radiculopathy, lumbar region: Secondary | ICD-10-CM | POA: Diagnosis not present

## 2021-08-04 DIAGNOSIS — M5412 Radiculopathy, cervical region: Secondary | ICD-10-CM | POA: Diagnosis not present

## 2021-08-11 ENCOUNTER — Ambulatory Visit: Payer: Medicare HMO | Admitting: Psychiatry

## 2021-09-01 ENCOUNTER — Ambulatory Visit (INDEPENDENT_AMBULATORY_CARE_PROVIDER_SITE_OTHER): Payer: Medicare HMO | Admitting: Psychiatry

## 2021-09-01 ENCOUNTER — Other Ambulatory Visit: Payer: Self-pay

## 2021-09-01 DIAGNOSIS — M26609 Unspecified temporomandibular joint disorder, unspecified side: Secondary | ICD-10-CM | POA: Diagnosis not present

## 2021-09-01 DIAGNOSIS — F4010 Social phobia, unspecified: Secondary | ICD-10-CM | POA: Diagnosis not present

## 2021-09-01 DIAGNOSIS — G988 Other disorders of nervous system: Secondary | ICD-10-CM | POA: Diagnosis not present

## 2021-09-01 DIAGNOSIS — Z634 Disappearance and death of family member: Secondary | ICD-10-CM | POA: Diagnosis not present

## 2021-09-01 DIAGNOSIS — F422 Mixed obsessional thoughts and acts: Secondary | ICD-10-CM

## 2021-09-01 NOTE — Progress Notes (Unsigned)
Psychotherapy Progress Note [facility] Luan Moore, PhD LP  Patient ID: DOMONICK FILTER)    MRN: [id number] Therapy format: Individual psychotherapy Date: 09/01/2021      Start: 3:14p     Stop: ***:***     Time Spent: *** min Location: In-person   Session narrative (presenting needs, interim history, self-report of stressors and symptoms, applications of prior therapy, status changes, and interventions made in session) "I need you today.  My mom passed away."  08-19-2022, peacefully in her sleep.  With his usual habit of being in bed a while, it was the morning aide who found her.  Beloved aide, deeply befriended, bawled on finding her, left in about 15 minutes as her assignment had also expired, which meant Yvone Neu was left 3-4 hrs alone with mother's body.  Says he shook like a leaf not knowing what to do.  Did call sister Tye [place], who was sensitive.  Funeral home guided him in calling 911, worked through that.  [name] left before funeral home did pickup.  Had traditional open-casket visitation and funeral.  Mom the last of her generation, all passing in the last 4 years, d. 59yo, same age as F.  No indication of mortal illness, just knew that she had A fib, CHF, a pacemaker, and blood clots behind her heart, a mild stroke after strep, pneumonia, whole series better part of 3 years ago.  Still finds himself looking for the Meals on Wheels car and listening for her air cleaner at night, but making the adjustment.  Felt purposeful to comfort [name], and relations with Tye Maryland fully amicable.  Beloved old pastor came to visitation.  Soon to work on [employer], has had to Administrator, Civil Service, found a referenced atty, Sena Slate, for a very reasonable cost.    Now facing unspecified change in finances.  Clear decision it's OK for Yvone Neu to live there through the estate process, and clear he can and will assess his budget.  Suspects he'll have to reduce what he pays for [name], bu  that's later.  TMJ pain still potent.  Shaves every 3rd day to spare aggravating it.    Noted intrusive feelings of weakness in public places.  Anxiety  Therapeutic modalities: {AM:23362::"Cognitive Behavioral Therapy","Solution-Oriented/Positive Psychology"}  Mental Status/Observations:  Appearance:   {PSY:22683}     Behavior:  {PSY:21022743}  Motor:  {PSY:22302}  Speech/Language:   {PSY:22685}  Affect:  {PSY:22687}  Mood:  {PSY:31886}  Thought process:  {PSY:31888}  Thought content:    {PSY:[id number]}  Sensory/Perceptual disturbances:    {PSY:[id number]}  Orientation:  {Psych Orientation:23301::"Fully oriented"}  Attention:  {Good-Fair-Poor ratings:23770::"Good"}    Concentration:  {Good-Fair-Poor ratings:23770::"Good"}  Memory:  {PSY:9524603059}  Insight:    {Good-Fair-Poor ratings:23770::"Good"}  Judgment:   {Good-Fair-Poor ratings:23770::"Good"}  Impulse Control:  {Good-Fair-Poor ratings:23770::"Good"}   Risk Assessment: Danger to Self: {Risk:22599::"No"} Self-injurious Behavior: {Risk:22599::"No"} Danger to Others: {Risk:22599::"No"} Physical Aggression / Violence: {Risk:22599::"No"} Duty to Warn: {AMYesNo:22526::"No"} Access to Firearms a concern: {AMYesNo:22526::"No"}  Assessment of progress:  {Progress:22147::"progressing"}  Diagnosis:   ICD-10-CM   1. 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 Social anxiety disorder  F40.10     3. Bereavement  Z63.4     4. 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 Already scheduled  visit in this office 09/22/2021.  Blanchie Serve, PhD Luan Moore, PhD LP Clinical Psychologist, [facility] [address], [place], [place] [address] [phone]

## 2021-09-06 ENCOUNTER — Ambulatory Visit: Payer: Medicare HMO | Admitting: Psychiatry

## 2021-09-22 ENCOUNTER — Ambulatory Visit: Payer: Medicare HMO | Admitting: Psychiatry

## 2021-09-22 ENCOUNTER — Ambulatory Visit (INDEPENDENT_AMBULATORY_CARE_PROVIDER_SITE_OTHER): Payer: Medicare HMO | Admitting: Psychiatry

## 2021-09-22 DIAGNOSIS — F4010 Social phobia, unspecified: Secondary | ICD-10-CM

## 2021-09-22 DIAGNOSIS — F331 Major depressive disorder, recurrent, moderate: Secondary | ICD-10-CM

## 2021-09-22 DIAGNOSIS — F40232 Fear of other medical care: Secondary | ICD-10-CM | POA: Diagnosis not present

## 2021-09-22 DIAGNOSIS — M26609 Unspecified temporomandibular joint disorder, unspecified side: Secondary | ICD-10-CM | POA: Diagnosis not present

## 2021-09-22 DIAGNOSIS — F422 Mixed obsessional thoughts and acts: Secondary | ICD-10-CM | POA: Diagnosis not present

## 2021-09-22 DIAGNOSIS — G9332 Myalgic encephalomyelitis/chronic fatigue syndrome: Secondary | ICD-10-CM

## 2021-09-22 DIAGNOSIS — G4720 Circadian rhythm sleep disorder, unspecified type: Secondary | ICD-10-CM

## 2021-09-22 DIAGNOSIS — E639 Nutritional deficiency, unspecified: Secondary | ICD-10-CM

## 2021-09-22 DIAGNOSIS — G988 Other disorders of nervous system: Secondary | ICD-10-CM | POA: Diagnosis not present

## 2021-09-22 DIAGNOSIS — Z634 Disappearance and death of family member: Secondary | ICD-10-CM | POA: Diagnosis not present

## 2021-09-22 NOTE — Progress Notes (Signed)
Psychotherapy Progress Note ?[facility] ?Luan Moore, PhD LP ? ?Patient ID: Adam Meyers)    MRN: [id number] ?Therapy format: Individual psychotherapy ?Date: 09/22/2021      Start: 3:10p     Stop: 4:00p     Time Spent: 50 min ?Location: In-person  ? 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  Research officer, political party on the job.  Notices sleeping more, and the time change typically takes 6 weeks to adjust to.   ? ?TMJ still sensitive, and had a 2-day recovery from going to the dentist for quarterly cleaning.  Glad to report this cleaning didn't take the heroic efforts of last time.  Credit to repetition, Meloxicam, Valium.  Mention of flossing gave him a charge, with challenge to open his jaw more, and hx of flossing setting off TMJ flareups, all of which have intensified his phobia.  Even shaving will set off TMJ for 2 hrs.   ? 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 ? ?Notes how hard it is to pass his mother's unchanged room and see her shoes, other possessions there as if she were still about to need them.  Probed willingness to move or change things in the house to help break up the haunted feeling, confirmed he has the right to manage or dispose of items a he sees fit.  Aware he would like to move medical items like shower chair, bedside commode, and two walkers, maybe donate.  Discussed possibility of getting them out of the line of sight through the doorway, if not out of house.  Says he couldn't touch the commode for contamination anxiety, and even if someone else did it, he can't abide putting the commode in his car, but he  could do the other 3 items readily, either taking to the curb or to a secondhand med supply shop.  Says it is nonstarter to simply stow them out of sight, since the house, and mother's room presumably, is jam packed already with the kind of hoarding Depression-era children do.  Resolved to attempt at least those three medical items and perhaps ask sister to handle the commode as a start. ? ?[name] is faring well, school going great.  Still amazed at his discipline compared to high school.  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  He's active in small group, campus job, and a couple intramural sports.  80 lbs down in the year and working out regularly.  Just flourishing at college in a way that was unimaginable in high school. ? ?Discussed fatigue which still limits a number of activities.  Has known hx of low D, not sure he is adequately treating it.  Encouraged to check it out with PCP.  Did not update whether he is in PT and whether fatigue is a focus, but encourage better aligning sleep time with daylight and managing light for mood and better physiological self-regulation. ? ?Therapeutic modalities: Cognitive Behavioral Therapy, Solution-Oriented/Positive Psychology, and Ego-Supportive ? ?Mental Status/Observations: ? ?Appearance:   Casual     ?Behavior:  Appropriate  ?Motor:  Restrained, a usual  ?Speech/Language:   Clear and Coherent and jaw guarding  ?Affect:  Appropriate  ?  Mood:  anxious and dysthymic  ?Thought process:  normal  ?Thought content:    Obsessions  ?Sensory/Perceptual disturbances:    WNL exc usual hypersensitivity not noticeable here  ?Orientation:  Fully oriented  ?Attention:  Good  ?  ?Concentration:  Good  ?Memory:  WNL  ?Insight:    Good  ?Judgment:   Good  ?Impulse Control:  Fair  ? ?Risk Assessment: ?Danger to Self: No Self-injurious Behavior: No ?Danger to Others: No Physical Aggression / Violence: No ?Duty to Warn: No Access to Firearms a  concern: No ? ?Assessment of progress:  stabilized ? ?Diagnosis: ?  ICD-10-CM   ?1. 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  ?2. Social anxiety disorder  F40.10   ?  ?3. Sensory hypersensitivity  G98.8   ?  ?4. Major depressive disorder, recurrent episode, moderate (HCC)  F33.1   ?  ?5. Chronic fatigue syndrome  G93.32   ?  ?6. 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  ?7. Bereavement  Z63.4   ?  ?8. 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  ?9. Nutritional problems  E63.9   ?  ?10. 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  ? 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 ?Continued encouragement to diversify diet -- still probably operating in nutritional deficiencies longterm ?Worth testing vitamin D, B12 with PCP ?Continued encouragement to decatastrophize jaw pain and manage it more than fear it -- hypervigilance and avoidance will tend to keep it tightened and more easily inflamed ?Where able and needed, face fears and dismantle overcautious handling of OCD challenges like touching items ?For home environment, morale, and progress in grieving, see about getting medical items out of sight, house ?Continued encouragement to start the day earlier ?Other recommendations/advice as may be noted above ?Continue to utilize previously learned skills ad lib ?Maintain medication as prescribed and work faithfully with relevant prescriber(s) if any changes are desired or seem indicated ?Call the clinic on-call service, 988/hotline, 911, or present to [facility] or ER if any life-threatening psychiatric crisis ?Return for session(s) already scheduled. ?Already scheduled visit in this office 09/29/2021. ? ?Blanchie Serve, PhD ?Luan Moore, PhD LP ?Clinical Psychologist, [facility] ?[facility] ?[address] ?[place], [place] [address] ?(o) [phone] ?

## 2021-09-29 ENCOUNTER — Encounter: Payer: Self-pay | Admitting: Psychiatry

## 2021-09-29 ENCOUNTER — Ambulatory Visit (INDEPENDENT_AMBULATORY_CARE_PROVIDER_SITE_OTHER): Payer: Medicare HMO | Admitting: Psychiatry

## 2021-09-29 DIAGNOSIS — F331 Major depressive disorder, recurrent, moderate: Secondary | ICD-10-CM

## 2021-09-29 DIAGNOSIS — F5105 Insomnia due to other mental disorder: Secondary | ICD-10-CM | POA: Diagnosis not present

## 2021-09-29 DIAGNOSIS — M26609 Unspecified temporomandibular joint disorder, unspecified side: Secondary | ICD-10-CM

## 2021-09-29 DIAGNOSIS — F4010 Social phobia, unspecified: Secondary | ICD-10-CM

## 2021-09-29 DIAGNOSIS — Z636 Dependent relative needing care at home: Secondary | ICD-10-CM | POA: Diagnosis not present

## 2021-09-29 DIAGNOSIS — F3341 Major depressive disorder, recurrent, in partial remission: Secondary | ICD-10-CM

## 2021-09-29 DIAGNOSIS — F422 Mixed obsessional thoughts and acts: Secondary | ICD-10-CM | POA: Diagnosis not present

## 2021-09-29 DIAGNOSIS — G9332 Myalgic encephalomyelitis/chronic fatigue syndrome: Secondary | ICD-10-CM | POA: Diagnosis not present

## 2021-09-29 MED ORDER — FLUVOXAMINE MALEATE 100 MG PO TABS: ORAL_TABLET | ORAL | 1 refills | Status: DC

## 2021-09-29 MED ORDER — DIAZEPAM 5 MG PO TABS: ORAL_TABLET | ORAL | 3 refills | Status: DC

## 2021-09-29 NOTE — Progress Notes (Signed)
Adam Meyers ?[id number] ?[birth year] ?61 y.o. ? ? ?Subjective:  ? ?Patient ID:  Adam Meyers is a 61 y.o. (DOB February 05, 1961) male. ? ?Chief Complaint:  ?Chief Complaint  ?Patient presents with  ? Follow-up  ? Anxiety  ? Depression  ? Sleeping Problem  ? ? ?Depression ?       Associated symptoms include fatigue and headaches. ?Adam Meyers presents for follow-up of OCD and anxiety. ? ?seen November 2020.  No meds were changed at the time.  He was taking Luvox 150 mg a day and diazepam 5 mg at night for sleep ? ?08/13/19 appt with the following noted: ?Worrying over pending loss of power bc 59 yo mother with him.  Her dementia is progressing.  Has daily help now. ?Stress living with mother.  Hosp for a month 2020 with cardiac and stroke problems.  Minor stroke.  Caretaking mother  ?Better TMJ with diazepam at HS.  Fear of the dentist now.  Fear of other doctors also. ?Was taking diazepam 5 hs for sleep.  Then did ok without it and stopped it. ?Half the time forgets the am dose of fluvoxamine.  Asks what that might do.  Not sure if anxiety or OCD is worse.  Sometimes lethargic.  Able to get out of the house some now with help.  Some germ issues at the grocery store periodically if everything doesn't go just right. ?Plan: be CW med.  No changes ? ?02/11/20 appt with the following noted: ?Only taking fluvoxamine 100 mg daily. ?Vaccinated July 8, but "pushed every button that I got." for OCD.  Hard to make decisions and second guessing himself.   ?Son Adam Meyers off to [place] and pt got stomach bug a couple of days later and now fatigued.  That also triggered anxiety.  Being in a hotel room triggered him also.   ?Plan rec fluvoxamine 150 for better control ? ?07/14/2020 appt noted: ?Never followed through on increasing fluvoxamine to 150 mg daily.  Doesn't like meds.   ?HA more frequent lately and today. ?Caretaking wearing him down.  ?Avoids dentist bc OCD.  Chronic anxiety.  ?Plan: Rec increase fluvoxamine but he complains  grogginess and sex SE at higher dosage.  Be consistent with 150 mg daily.  He probably will not. ? ?12/08/20 appt noted: ?[name] Retired. ?Concerns about bruised leg discussed. ?General health concerns.   ?Stayed on Luvox 100 daily since here and never increased. ?Chronic anxiety and some is somatic. ?Handled recent OCD trigger OK. ?Fear of dentist but needs to go. ?Tired all the time. ?Neighbors bothering sleep occasionally. ?Plan no med changes ? ?03/10/21 appt noted: ?Got to dentist but took everything in me bc phobic.  Abcess and root canal.  July 13.  Then FU for normal exam and gum dz.  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 ?Asks about dental Rx for Flexeril and meloxicam, if it's OK ?Son at App and 22yo.   ?Patient reports stable mood and denies depressed or irritable moods.  Patient denies difficulty with sleep initiation or maintenance. 10-11 hours typical lately.  Likes to sleep a lot.  Meyers energy. Brain won't work with less. Strange dreams.  Denies appetite disturbance.  Patient reports that energy and motivation have been good.  Patient denies any difficulty with concentration.  Patient denies any suicidal ideation. ?Plan: Continue fluvoxamine 150 mg nightly and diazepam 5 mg nightly. ? ?09/29/2021 appointment with the following noted: ?Continued fluvoxamine 150 mg and diazepam 5 HS. ?M  passed away in Feb.  She had been helped by vitamin E and Aricept.  Aid found her.  She passed away in her sleep.   Didn't know what to do.  It was fairly "traumatic".  Sister came to the house.   ?This created more anxiety for about a month. ?Continues to be restriccted by anxiety.   ?Living alone and feels ok about it.   ? ? ? ?Sensitive to loud noises.  Never bothered me before my meltdown. ? ?Past Psychiatric Medication Trials: Paxil Sex SE  ?Afraid of Zoloft bc mother hallucinated on it. ?Luvox 200 cog SE  ?pindolol, propranolol, diazepam ? ?Review of Systems:  ?Review of Systems  ?Constitutional:  Positive for  fatigue.  ?HENT:  Positive for dental problem. Negative for postnasal drip, rhinorrhea and sneezing.   ?     Jaw pain ongoing  ?Musculoskeletal:  Positive for arthralgias, gait problem and neck pain.  ?Skin:  Positive for rash.  ?Neurological:  Positive for headaches. Negative for tremors and weakness.  ?Psychiatric/Behavioral:  The patient is nervous/anxious.   ? ?Medications: I have reviewed the patient's current medications. ? ?Current Outpatient Medications  ?Medication Sig Dispense Refill  ? Cholecalciferol (VITAMIN D) 50 MCG (2000 UT) CAPS Take by mouth.    ? diazepam (VALIUM) 5 MG tablet TAKE 1 TABLET BY MOUTH THREE TIMES A DAY AS NEEDED FOR ANXIETY 90 tablet 5  ? famotidine (PEPCID) 20 MG tablet Take 20 mg by mouth daily as needed for heartburn or indigestion.    ? fluvoxaMINE (LUVOX) 100 MG tablet Take 1/2 tablet (50 mg) by mouth every morning and 1 tablet (100 mg) at bedtime 135 tablet 1  ? ibuprofen (ADVIL) 200 MG tablet Take 600 mg by mouth every 8 (eight) hours as needed for moderate pain.     ? ?No current facility-administered medications for this visit.  ? ? ?Medication Side Effects: None ? ?Allergies:  ?Allergies  ?Allergen Reactions  ? Oxycodone-Acetaminophen Itching  ? Seasonal Ic [Cholestatin]   ?  Drainage,itchy eyes  ? Gabapentin Anxiety  ? Percocet [Oxycodone-Acetaminophen] Itching and Anxiety  ? ? ?Past Medical History:  ?Diagnosis Date  ? Compressed cervical disc   ? Cough   ? Depression   ? Hyperlipidemia   ? Nasal congestion   ? Social anxiety disorder 04/10/2018  ? ? ?Family History  ?Problem Relation Age of Onset  ? Cancer Maternal Grandmother   ?     melanoma  ? ? ?Social History  ? ?Socioeconomic History  ? Marital status: Married  ?  Spouse name: Not on file  ? Number of children: Not on file  ? Years of education: Not on file  ? Highest education level: Not on file  ?Occupational History  ? Not on file  ?Tobacco Use  ? Smoking status: Never  ? Smokeless tobacco: Never  ?Substance and  Sexual Activity  ? Alcohol use: No  ? Drug use: No  ? Sexual activity: Not on file  ?Other Topics Concern  ? Not on file  ?Social History Narrative  ? Not on file  ? ?Social Determinants of Health  ? ?Financial Resource Strain: Not on file  ?Food Insecurity: Not on file  ?Transportation Needs: Not on file  ?Physical Activity: Not on file  ?Stress: Not on file  ?Social Connections: Not on file  ?Intimate Partner Violence: Not on file  ? ? ?Past Medical History, Surgical history, Social history, and Family history were reviewed and updated as appropriate.  ? ?  Please see review of systems for further details on the patient's review from today.  ? ?Objective:  ? ?Physical Exam:  ?There were no vitals taken for this visit. ? ?Physical Exam ?Constitutional:   ?   General: He is not in acute distress. ?Musculoskeletal:     ?   General: No deformity.  ?Neurological:  ?   Mental Status: He is alert and oriented to person, place, and time.  ?   Cranial Nerves: No dysarthria.  ?   Coordination: Coordination normal.  ?Psychiatric:     ?   Attention and Perception: Attention and perception normal. He does not perceive auditory or visual hallucinations.     ?   Mood and Affect: Mood is anxious. Mood is not depressed. Affect is not labile, blunt, angry or inappropriate.     ?   Speech: Speech normal.     ?   Behavior: Behavior normal. Behavior is cooperative.     ?   Thought Content: Thought content normal. Thought content is not paranoid or delusional. Thought content does not include homicidal or suicidal ideation. Thought content does not include suicidal plan.     ?   Cognition and Memory: Cognition and memory normal.     ?   Judgment: Judgment normal.  ?   Comments: Residual chronic OCD but no worse. ?It still interferes with function and QOL markedly. ?Chronically fearful of med change. ?Fair insight and judgment ?Talkative without pressure ?  ? ? ? ?Lab Review:  ?No results found for: NA, K, CL, CO2, GLUCOSE, BUN,  CREATININE, CALCIUM, PROT, ALBUMIN, AST, ALT, ALKPHOS, BILITOT, GFRNONAA, GFRAA ? ?No results found for: WBC, RBC, HGB, HCT, PLT, MCV, MCH, MCHC, RDW, LYMPHSABS, MONOABS, EOSABS, BASOSABS ? ?No results found f

## 2021-10-13 ENCOUNTER — Ambulatory Visit (INDEPENDENT_AMBULATORY_CARE_PROVIDER_SITE_OTHER): Payer: Medicare HMO | Admitting: Psychiatry

## 2021-10-13 DIAGNOSIS — F4010 Social phobia, unspecified: Secondary | ICD-10-CM

## 2021-10-13 DIAGNOSIS — M26609 Unspecified temporomandibular joint disorder, unspecified side: Secondary | ICD-10-CM

## 2021-10-13 DIAGNOSIS — F422 Mixed obsessional thoughts and acts: Secondary | ICD-10-CM | POA: Diagnosis not present

## 2021-10-13 DIAGNOSIS — G9332 Myalgic encephalomyelitis/chronic fatigue syndrome: Secondary | ICD-10-CM

## 2021-10-13 DIAGNOSIS — F331 Major depressive disorder, recurrent, moderate: Secondary | ICD-10-CM

## 2021-10-13 DIAGNOSIS — F40232 Fear of other medical care: Secondary | ICD-10-CM

## 2021-10-13 DIAGNOSIS — G988 Other disorders of nervous system: Secondary | ICD-10-CM | POA: Diagnosis not present

## 2021-10-13 NOTE — Progress Notes (Signed)
Psychotherapy Progress Note [facility] [name], PhD LP  Patient ID: Adam Meyers)    MRN: [id number] Therapy format: Individual psychotherapy Date: 10/13/2021      Start: 3:25p     Stop: 4:15p     Time Spent: 50 min Location: In-person   Session narrative (presenting needs, interim history, self-report of stressors and symptoms, applications of prior therapy, status changes, and interventions made in session) Jaw remains chronically aggravated.  Shaving is provocative, "the worst".  Meloxicam can help, but can feel too affected to drive.  The tooth he had trouble with is acting up again, dentist's theory now it's a strained ligament.  Daily antiinflammatory, but no real fix.  Could have a mouth guard but can't open wide enough to make the impression.  C/o daily tinnitus, too.  Got the call about colonoscopy.  Anxiety-provoking, partly for bad memory of the last time he did, which involved the embarrassment of getting paralyzed in the bathroom over urinary splatter, and the emotional trauma of having that procedure on a Friday and being fired on Monday.    Encouraged to go ahead and book colonoscopy, it'll be over sooner, and he can't possibly have the same bad experience.  Has been getting up earlier, reliably before noon.  Affirmed and encouraged.    Therapeutic modalities: Cognitive Behavioral Therapy and Solution-Oriented/Positive Psychology  Mental Status/Observations:  Appearance:   Casual     Behavior:  Appropriate  Motor:  Normal, exc tentative about speaking too much re. jaw  Speech/Language:   tense  Affect:  Appropriate  Mood:  anxious  Thought process:  normal  Thought content:    Obsessions  Sensory/Perceptual disturbances:    WNL  Orientation:  Fully oriented  Attention:  Good    Concentration:  Good  Memory:  WNL  Insight:    Fair  Judgment:   Good  Impulse Control:  Good   Risk Assessment: Danger to Self: No Self-injurious  Behavior: No Danger to Others: No Physical Aggression / Violence: No Duty to Warn: No Access to Firearms a concern: No  Assessment of progress:  progressing  Diagnosis:   ICD-10-CM   1. 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 Social anxiety disorder  F40.10     3. Major depressive disorder, recurrent episode, moderate (HCC)  F33.1     4. Chronic fatigue syndrome  G93.32     5. 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      Plan:  Consider magnesium supplementation, for anxiety and TMJ muscle tension Continue approaching feared medical scenarios ad lib, esp scheduling colonoscopy Continued encouragement to diversify diet -- still probably operating in nutritional deficiencies longterm Worth testing vitamin D, B12 with PCP Continued encouragement to decatastrophize jaw pain and manage it more than fear it -- hypervigilance and avoidance will tend to keep it tightened and more easily inflamed Continue to dismantle mother's room and paraphernalia as tolerated Continued encouragement to start the day earlier Other recommendations/advice as may be noted above Continue to utilize previously learned skills ad lib Maintain medication as prescribed and work faithfully with relevant prescriber(s) if any changes are desired or seem indicated Call the clinic on-call service, 988/hotline, 911, or present to [facility] or ER if any life-threatening psychiatric crisis Return for session(s) already scheduled. Already scheduled visit in this office 11/03/2021.  Blanchie Serve, PhD Luan Moore, PhD LP Clinical Psychologist, [facility] [address], [place], [place] [address] [phone]

## 2021-11-03 ENCOUNTER — Ambulatory Visit: Payer: Medicare HMO | Admitting: Psychiatry

## 2021-11-03 DIAGNOSIS — F422 Mixed obsessional thoughts and acts: Secondary | ICD-10-CM | POA: Diagnosis not present

## 2021-11-03 DIAGNOSIS — G988 Other disorders of nervous system: Secondary | ICD-10-CM

## 2021-11-03 DIAGNOSIS — M26609 Unspecified temporomandibular joint disorder, unspecified side: Secondary | ICD-10-CM | POA: Diagnosis not present

## 2021-11-03 DIAGNOSIS — F331 Major depressive disorder, recurrent, moderate: Secondary | ICD-10-CM

## 2021-11-03 DIAGNOSIS — F40232 Fear of other medical care: Secondary | ICD-10-CM

## 2021-11-03 DIAGNOSIS — G4720 Circadian rhythm sleep disorder, unspecified type: Secondary | ICD-10-CM | POA: Diagnosis not present

## 2021-11-03 DIAGNOSIS — G9332 Myalgic encephalomyelitis/chronic fatigue syndrome: Secondary | ICD-10-CM | POA: Diagnosis not present

## 2021-11-03 DIAGNOSIS — F4010 Social phobia, unspecified: Secondary | ICD-10-CM

## 2021-11-03 NOTE — Progress Notes (Signed)
Psychotherapy Progress Note [facility] [name], PhD LP  Patient ID: Adam Meyers)    MRN: [id number] Therapy format: Individual psychotherapy Date: 11/03/2021      Start: 3:14p     Stop: 4:05p     Time Spent: 51 min Location: In-person   Session narrative (presenting needs, interim history, self-report of stressors and symptoms, applications of prior therapy, status changes, and interventions made in session) Evocative day, being wedding anniversary and 1st Mother's Day since [name] death coming up soon.  Quandary about [name] coming and how to interact with her.  Phantom concern about her gaming to get reinvolved with him, but realizes it's irrational/unrealistic.  Tension anticipating colonoscopy, partly for the TMJ provocation.  TMJ still active, still has hours and hours of painful aftermath just shaving, including tinnitus, and requires Meloxicam for it.  Some dread of estate business as well.  [employer] recently mandated a street-level box, no longer on the porch, irritating him about having to walk out further to get mail.  Discussed possibility of calling in a friend, but no good prospects.  Reinforced anxiety approach and regulating sleep and nutrition best possible.  Therapeutic modalities: Cognitive Behavioral Therapy and Solution-Oriented/Positive Psychology  Mental Status/Observations:  Appearance:   Casual     Behavior:  Appropriate  Motor:  Normal and tense  Speech/Language:   tense  Affect:  Appropriate  Mood:  anxious  Thought process:  normal  Thought content:    Obsessions  Sensory/Perceptual disturbances:    WNL  Orientation:  Fully oriented  Attention:  Good    Concentration:  Good  Memory:  WNL  Insight:    Fair  Judgment:   Good  Impulse Control:  Good   Risk Assessment: Danger to Self: No Self-injurious Behavior: No Danger to Others: No Physical Aggression / Violence: No Duty to Warn: No Access to Firearms a concern:  No  Assessment of progress:  stabilized  Diagnosis:   ICD-10-CM   1. 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 Social anxiety disorder  F40.10     3. Major depressive disorder, recurrent episode, moderate (HCC)  F33.1     4. Chronic fatigue syndrome  G93.32     5. 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 colonoscopy and dentistry Cont diversify diet as tolerated Work with [employer] if desired re. verifying or getting an exception to mailbox requirement, otherwise count it exercise to go further Assess vitamins when able -- suspect D, B12 -- and consider magnesium for muscle tension and anxiety help Try to start day earlier, for mood and anxiety benefit Other recommendations/advice as may be noted above Continue to utilize previously learned skills ad lib Maintain medication as prescribed and work faithfully with relevant prescriber(s) if any changes are desired or seem indicated Call the clinic on-call service, 988/hotline, 911, or present to [facility] or ER if any life-threatening psychiatric crisis Return for session(s) already scheduled. Already scheduled visit in this office 12/01/2021.  [name], PhD [name], PhD LP Clinical Psychologist, [facility] [address], [address] [place], [place] [address] [phone]

## 2021-12-01 ENCOUNTER — Ambulatory Visit: Payer: Medicare HMO | Admitting: Psychiatry

## 2021-12-16 DIAGNOSIS — K645 Perianal venous thrombosis: Secondary | ICD-10-CM | POA: Diagnosis not present

## 2021-12-19 ENCOUNTER — Telehealth: Payer: Self-pay | Admitting: Psychiatry

## 2021-12-22 ENCOUNTER — Ambulatory Visit (INDEPENDENT_AMBULATORY_CARE_PROVIDER_SITE_OTHER): Payer: Medicare HMO | Admitting: Psychiatry

## 2021-12-22 DIAGNOSIS — G9332 Myalgic encephalomyelitis/chronic fatigue syndrome: Secondary | ICD-10-CM

## 2021-12-22 DIAGNOSIS — G4720 Circadian rhythm sleep disorder, unspecified type: Secondary | ICD-10-CM

## 2021-12-22 DIAGNOSIS — F4010 Social phobia, unspecified: Secondary | ICD-10-CM

## 2021-12-22 DIAGNOSIS — G988 Other disorders of nervous system: Secondary | ICD-10-CM | POA: Diagnosis not present

## 2021-12-22 DIAGNOSIS — M26609 Unspecified temporomandibular joint disorder, unspecified side: Secondary | ICD-10-CM | POA: Diagnosis not present

## 2021-12-22 DIAGNOSIS — E639 Nutritional deficiency, unspecified: Secondary | ICD-10-CM | POA: Diagnosis not present

## 2021-12-22 DIAGNOSIS — M549 Dorsalgia, unspecified: Secondary | ICD-10-CM

## 2021-12-22 DIAGNOSIS — F422 Mixed obsessional thoughts and acts: Secondary | ICD-10-CM

## 2021-12-22 DIAGNOSIS — F331 Major depressive disorder, recurrent, moderate: Secondary | ICD-10-CM | POA: Diagnosis not present

## 2021-12-22 DIAGNOSIS — G8929 Other chronic pain: Secondary | ICD-10-CM

## 2021-12-22 DIAGNOSIS — F40232 Fear of other medical care: Secondary | ICD-10-CM | POA: Diagnosis not present

## 2021-12-22 NOTE — Progress Notes (Signed)
Psychotherapy Progress Note [facility] [name], PhD LP  Patient ID: Adam Meyers)    MRN: [id number] Therapy format: Individual psychotherapy Date: 12/22/2021      Start: 3:15p     Stop: 4:02p     Time Spent: 47 min Location: Telehealth visit -- I connected with this patient by an approved telecommunication method (audio only), with his informed consent, and verifying identity and patient privacy.  I was located at my office and patient at his home.  As needed, we discussed the limitations, risks, and security and privacy concerns associated with telehealth service, including the availability and conditions which currently govern in-person appointments and the possibility that 3rd-party payment may not be fully guaranteed and he may be responsible for charges.  After he indicated understanding, we proceeded with the session.  Also discussed treatment planning, as needed, including ongoing verbal agreement with the plan, the opportunity to ask and answer all questions, his demonstrated understanding of instructions, and his readiness to call the office should symptoms worsen or he feels he is in a crisis state and needs more immediate and tangible assistance.   Session narrative (presenting needs, interim history, self-report of stressors and symptoms, applications of prior therapy, status changes, and interventions made in session) 6 wks since last seen, spoke briefly the other day to answer strong doubts about what to say on a Social Security review questionnaire.  Validated that we have spoken of work and do not feel he is capable of employment at this time.  Telehealth today d/t diarrhea and bleeding hemorrhoid.  Noted several days of having to deal with fresh blood without knowing what it was, prompting a lot of worry, not to mention the vulnerability of having to go to the physician.  Summoned himself eventually to go, hoping it could just be a matter of a  topical, but found out it had to be more.  Withstood a panic attack, partly for being able to ask for a familiar nurse to calm him down.  Favorably impressed by the PA, too, after a lot of trepidation about losing his familiar comforting doctor.  Victory had in submitting himself to the procedure, the nakedness, and the invasion feeling of having his anus manipulated and burned.   Re. OCD, it also meant not being free to indulge his full shower habit right after seeing the doctor, which meant putting up with compulsion several hours.  Now recovering, notable soreness, bleeding stopped, though it took all weekend for the anxiety to die down.  Had to go back out Tuesday for more wet wipes, which was another piece of courage in action.  Affirmed courage used, validated letting himself lie down and nap afterward.  Blessed to have son and ex-W pass through Saturday, got supplies.  Seems to be having IBS-D, clearing last day or two.    Other victories in going to niece's graduation (crowded, at [facility]), for which he was willing to go, alone, even before son said he could go, too.  Next day went to cookout at sister's house, dealt with the family commotion.  Has calendared his colonoscopy, too, and is scheduled to go back to the dentist, made harder by a very understanding hygienist leaving.  In light of ongoing traumatized reaction to the chiropractor years ago, finds it helpful to be reminded (re. doctor's office and physical therapy) that the new provider could be good like the one he lost, don't only ponder what if bad, or less.  Encouraged do same with dental hygienist.  Therapeutic modalities: Cognitive Behavioral Therapy, Solution-Oriented/Positive Psychology, and Ego-Supportive  Mental Status/Observations:  Appearance:   Not assessed     Behavior:  Appropriate  Motor:  Not assessed  Speech/Language:   Clear and Coherent  Affect:  Not assessed  Mood:  anxious  Thought process:  normal  Thought  content:    Obsessions  Sensory/Perceptual disturbances:    WNL and exc pain  Orientation:  Fully oriented  Attention:  Good    Concentration:  Good  Memory:  WNL  Insight:    Good  Judgment:   Good  Impulse Control:  Good   Risk Assessment: Danger to Self: No Self-injurious Behavior: No Danger to Others: No Physical Aggression / Violence: No Duty to Warn: No Access to Firearms a concern: No  Assessment of progress:  progressing  Diagnosis:   ICD-10-CM   1. 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 Social anxiety disorder  F40.10     3. Major depressive disorder, recurrent episode, moderate (HCC)  F33.1     4. Chronic fatigue syndrome  G93.32     5. 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 Nutritional problems  E63.9     9. 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 Chronic back pain, unspecified back location, unspecified back pain laterality  M54.9    G89.29      Plan:  Self-affirm many positive acts of courage and exposure to uncertainty lately Continue ad lib exposure to feared encounters and services, especially health care Dispute fearful thinking about changing dental hygienists -- could be as good or better Continue recovery and self-care for IBS-D and hemorrhoid procedure, get more active as able Diversify nutrition as tolerated, then keep pushing variety and vegetable sources again.  Worth testing for deficiencies with PCP if not done already. Assess vitamins when able -- suspect D, B12 -- and consider magnesium for muscle tension and anxiety help Try to start day earlier, for mood and anxiety benefit Remains disabled from any occupation due to overall symptom severity Other recommendations/advice as may be noted above Continue to utilize previously learned skills ad lib Maintain medication as prescribed and work faithfully with relevant prescriber(s) if any  changes are desired or seem indicated Call the clinic on-call service, 988/hotline, 911, or present to [facility] or ER if any life-threatening psychiatric crisis Return for session(s) already scheduled. Already scheduled visit in this office 01/12/2022.  [name], PhD [name], PhD LP Clinical Psychologist, [facility] [address] [place], [place] [address] [phone]

## 2022-01-12 ENCOUNTER — Ambulatory Visit: Payer: Medicare HMO | Admitting: Psychiatry

## 2022-01-18 DIAGNOSIS — K921 Melena: Secondary | ICD-10-CM | POA: Diagnosis not present

## 2022-01-18 DIAGNOSIS — Z8371 Family history of colonic polyps: Secondary | ICD-10-CM | POA: Diagnosis not present

## 2022-01-18 DIAGNOSIS — Z1211 Encounter for screening for malignant neoplasm of colon: Secondary | ICD-10-CM | POA: Diagnosis not present

## 2022-01-30 DIAGNOSIS — Z125 Encounter for screening for malignant neoplasm of prostate: Secondary | ICD-10-CM | POA: Diagnosis not present

## 2022-01-30 DIAGNOSIS — R197 Diarrhea, unspecified: Secondary | ICD-10-CM | POA: Diagnosis not present

## 2022-01-30 DIAGNOSIS — R634 Abnormal weight loss: Secondary | ICD-10-CM | POA: Diagnosis not present

## 2022-01-30 DIAGNOSIS — E782 Mixed hyperlipidemia: Secondary | ICD-10-CM | POA: Diagnosis not present

## 2022-01-30 DIAGNOSIS — Z0000 Encounter for general adult medical examination without abnormal findings: Secondary | ICD-10-CM | POA: Diagnosis not present

## 2022-01-30 DIAGNOSIS — M2550 Pain in unspecified joint: Secondary | ICD-10-CM | POA: Diagnosis not present

## 2022-01-30 DIAGNOSIS — E559 Vitamin D deficiency, unspecified: Secondary | ICD-10-CM | POA: Diagnosis not present

## 2022-02-02 ENCOUNTER — Ambulatory Visit (INDEPENDENT_AMBULATORY_CARE_PROVIDER_SITE_OTHER): Payer: Medicare HMO | Admitting: Psychiatry

## 2022-02-02 DIAGNOSIS — G988 Other disorders of nervous system: Secondary | ICD-10-CM | POA: Diagnosis not present

## 2022-02-02 DIAGNOSIS — G4720 Circadian rhythm sleep disorder, unspecified type: Secondary | ICD-10-CM

## 2022-02-02 DIAGNOSIS — G9332 Myalgic encephalomyelitis/chronic fatigue syndrome: Secondary | ICD-10-CM

## 2022-02-02 DIAGNOSIS — G8929 Other chronic pain: Secondary | ICD-10-CM

## 2022-02-02 DIAGNOSIS — F40232 Fear of other medical care: Secondary | ICD-10-CM | POA: Diagnosis not present

## 2022-02-02 DIAGNOSIS — F4010 Social phobia, unspecified: Secondary | ICD-10-CM

## 2022-02-02 DIAGNOSIS — M26609 Unspecified temporomandibular joint disorder, unspecified side: Secondary | ICD-10-CM

## 2022-02-02 DIAGNOSIS — F422 Mixed obsessional thoughts and acts: Secondary | ICD-10-CM | POA: Diagnosis not present

## 2022-02-02 DIAGNOSIS — M549 Dorsalgia, unspecified: Secondary | ICD-10-CM

## 2022-02-02 DIAGNOSIS — F331 Major depressive disorder, recurrent, moderate: Secondary | ICD-10-CM | POA: Diagnosis not present

## 2022-02-02 DIAGNOSIS — E639 Nutritional deficiency, unspecified: Secondary | ICD-10-CM

## 2022-02-02 NOTE — Progress Notes (Signed)
Psychotherapy Progress Note [facility] 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  Came through strong nervousness ahead of the GI office visit, hard to sleep, but did commit to October colonoscopy.  Still cagey about whether he'll keep it.  Made it to dentist, despite beloved hygienist leaving, went through further eval and pain.  TMJ has been getting worse.  Talking, and more so shaving, can be plenty provocative.  Been a year now since his TMJ flareup, which he attributes to last summer's dental evaluation.  Only able to shave 1/5 days ever since.  Dentist said could not open his mouth wide enough to make a bite guard, so might get a prefab one from drug store.   Monday had annual physical, irritating this time for erroneously thinking he was done with pre-visit questions.  Does note he has gotten accustomed to having blood work done, not as provocative as it used to be.  Does c/o intermittent diarrhea.  Knows he had CMP and thyroid testing, though he knows his eating goes down with jaw pain.  Was advised to start Activia yogurt as a handy probiotic.  Long habits of drinking sweet tea and milk, but both sweets and dairy started bothering his stomach along about June.  Kidney function good, attributed to strong hydration habit.   C/o tinnitus, more or less constantly.    Therapeutic modalities: Cognitive Behavioral Therapy, Solution-Oriented/Positive Psychology, and Ego-Supportive  Mental Status/Observations:  Appearance:   Casual     Behavior:  Appropriate  Motor:   Normal, jaw tension associated with TMJ pain  Speech/Language:   Clear and Coherent  Affect:  Appropriate  Mood:  anxious and dysthymic  Thought process:  normal  Thought content:    Obsessions  Sensory/Perceptual disturbances:    WNL  Orientation:  Fully oriented  Attention:  Good    Concentration:  Good  Memory:  WNL  Insight:    Good  Judgment:   Good  Impulse Control:  Good   Risk Assessment: Danger to Self: No Self-injurious Behavior: No Danger to Others: No Physical Aggression / Violence: No Duty to Warn: No Access to Firearms a concern: No  Assessment of progress:  progressing  Diagnosis:   ICD-10-CM   1. 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 Fear of other medical care  F40.232     3. Social anxiety disorder  F40.10     4. Major depressive disorder, recurrent episode, moderate (HCC)  F33.1     5. 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 Chronic fatigue syndrome  G93.32     7. 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 Nutritional problems  E63.9     10. Chronic back pain, unspecified back location, unspecified back pain laterality  M54.9    G89.29      Plan:  OCD/phobias -- Self-affirm any positive acts of courage and exposure to uncertainty.  Continue ad lib exposure to feared encounters and  services, especially health care.  Dispute fearful thinking about changing providers -- could be as good or better. Medical needs -- Carry through all recommended dental care.  Continue self-care for IBS-D and hemorrhoid procedure, get more active as able.  Diversify nutrition as tolerated, then keep pushing variety and vegetable sources again.  Add probiotic, yogurt make sense as delivery system.  Worth testing for deficiencies with PCP if not done already -- suspect D, B12 -- and consider magnesium for muscle tension and anxiety help.  Follow through with planned colonoscopy October. Mood/morale -- Try to start day  earlier, for mood and anxiety benefit Disability -- Remains disabled from any occupation due to overall symptom severity Other recommendations/advice as may be noted above Continue to utilize previously learned skills ad lib Maintain medication as prescribed and work faithfully with relevant prescriber(s) if any changes are desired or seem indicated Call the clinic on-call service, 988/hotline, 911, or present to [facility] or ER if any life-threatening psychiatric crisis Return for session(s) already scheduled. Already scheduled visit in this office 02/22/2022.  [name], PhD [name], PhD LP Clinical Psychologist, [facility] [address], [address] [place], [place] [address] [phone]

## 2022-02-22 ENCOUNTER — Ambulatory Visit (INDEPENDENT_AMBULATORY_CARE_PROVIDER_SITE_OTHER): Payer: Medicare HMO | Admitting: Psychiatry

## 2022-02-22 DIAGNOSIS — G8929 Other chronic pain: Secondary | ICD-10-CM

## 2022-02-22 DIAGNOSIS — F422 Mixed obsessional thoughts and acts: Secondary | ICD-10-CM

## 2022-02-22 DIAGNOSIS — G479 Sleep disorder, unspecified: Secondary | ICD-10-CM

## 2022-02-22 DIAGNOSIS — G988 Other disorders of nervous system: Secondary | ICD-10-CM | POA: Diagnosis not present

## 2022-02-22 DIAGNOSIS — E639 Nutritional deficiency, unspecified: Secondary | ICD-10-CM

## 2022-02-22 DIAGNOSIS — M549 Dorsalgia, unspecified: Secondary | ICD-10-CM | POA: Diagnosis not present

## 2022-02-22 DIAGNOSIS — F4010 Social phobia, unspecified: Secondary | ICD-10-CM | POA: Diagnosis not present

## 2022-02-22 DIAGNOSIS — F331 Major depressive disorder, recurrent, moderate: Secondary | ICD-10-CM

## 2022-02-22 DIAGNOSIS — F40232 Fear of other medical care: Secondary | ICD-10-CM | POA: Diagnosis not present

## 2022-02-22 DIAGNOSIS — M26609 Unspecified temporomandibular joint disorder, unspecified side: Secondary | ICD-10-CM | POA: Diagnosis not present

## 2022-02-22 NOTE — Progress Notes (Signed)
Psychotherapy Progress Note [facility] [name], PhD LP  Patient ID: Adam Meyers)    MRN: [id number] Therapy format: Individual psychotherapy Date: 02/22/2022      Start: 3:15p     Stop: 4:05p     Time Spent: 50 min Location: In-person   Session narrative (presenting needs, interim history, self-report of stressors and symptoms, applications of prior therapy, status changes, and interventions made in session) Broken sleep last night, between jaw pain and other things.  Got a thick pack of medical results he sat on for a week then opened today but thought long about last night.  Glad to find his cholesterol came down (credits how he stopped eating much dessert).  Doctor concerned about weight loss, enough to test thyroid (normal), but [name] knows it's simply about avoiding food due to the need to chew with his TMJ pain.  Not clear whether he has disclosed this to his doctor.  One item, CRP, marked "normal" but he misunderstood the text with it.  Apparently testing for muscle wasting.  Discussed his jaw issue, which he has understood as damaged ligaments from chiropractic treatment years ago.  Maintains that he still cannot bear to shave but once every 5 days, and chewing, even talking for a length of time will provoke it.  Encouraged to use antiinflammatory treatment, especially if topical, like diclofenac, but to better understand the nature of his condition, achieve more effective muscle relaxation, and establish any beneficial bite guard to spare his jaw nighttime wear and tear.  Therapeutic modalities: Cognitive Behavioral Therapy, Solution-Oriented/Positive Psychology, Ego-Supportive, and Psycho-education/Bibliotherapy  Mental Status/Observations:  Appearance:   Casual     Behavior:  Appropriate  Motor:  Normal and jaw tension  Speech/Language:   Clear and Coherent  Affect:  Constricted  Mood:  anxious and dysthymic  Thought process:  normal  Thought  content:    Obsessions  Sensory/Perceptual disturbances:    WNL  Orientation:  Fully oriented  Attention:  Good    Concentration:  Good  Memory:  WNL  Insight:    Fair  Judgment:   Good  Impulse Control:  Good   Risk Assessment: Danger to Self: No Self-injurious Behavior: No Danger to Others: No Physical Aggression / Violence: No Duty to Warn: No Access to Firearms a concern: No  Assessment of progress:  stabilized  Diagnosis:   ICD-10-CM   1. 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 Fear of other medical care  F40.232     3. Social anxiety disorder  F40.10     4. Major depressive disorder, recurrent episode, moderate (HCC)  F33.1     5. 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 Nutritional problems  E63.9     7. 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 Chronic back pain, unspecified back location, unspecified back pain laterality  M54.9    G89.29     9. Sleep disturbance  G47.9      Plan:  Priority on TMJ care -- Look into topical antiinflammatory, use prior recommendation to warm compresses, experiment with gentle self-massage, and hopefully establish better muscle relaxant strategy.  For now, foods may need to be softer as well.  Most likely will need to dare to open his mouth widely enough to make a suitable bite guard. OCD/phobias -- Self-affirm any positive acts of courage and exposure to uncertainty.  Continue ad lib exposure to feared encounters and  services, especially health care.  Dispute fearful thinking about changing providers -- could be as good or better. Medical needs -- Carry through all recommended dental care.  Continue self-care for IBS-D and hemorrhoid procedure, get more active as able.  Diversify nutrition as tolerated, then keep pushing variety and vegetable sources again.  Add probiotic, yogurt make sense as delivery system.  Worth testing for deficiencies with PCP if not done already -- suspect D, B12 -- and consider magnesium for muscle tension  and anxiety help.  Follow through with planned colonoscopy October. Mood/morale -- Try to start day earlier, for mood and anxiety benefit Disability -- Remains disabled from any occupation due to overall symptom severity Medication management -- Given strong personal and family history of unusual medication reactions and the [facility] system's endorsement of genetic testing, recommend having it done through PCP or psychiatry. Other recommendations/advice as may be noted above Continue to utilize previously learned skills ad lib Maintain medication as prescribed and work faithfully with relevant prescriber(s) if any changes are desired or seem indicated Call the clinic on-call service, 988/hotline, 911, or present to [facility] or ER if any life-threatening psychiatric crisis Return for session(s) already scheduled. Already scheduled visit in this office 03/02/2022.  [name], PhD [name], PhD LP Clinical Psychologist, [facility] [address] [place], [place] [address] [phone]

## 2022-03-02 ENCOUNTER — Encounter: Payer: Self-pay | Admitting: Psychiatry

## 2022-03-02 ENCOUNTER — Ambulatory Visit: Payer: Medicare HMO | Admitting: Psychiatry

## 2022-03-02 DIAGNOSIS — F4010 Social phobia, unspecified: Secondary | ICD-10-CM

## 2022-03-02 DIAGNOSIS — G479 Sleep disorder, unspecified: Secondary | ICD-10-CM | POA: Diagnosis not present

## 2022-03-02 DIAGNOSIS — F422 Mixed obsessional thoughts and acts: Secondary | ICD-10-CM

## 2022-03-02 DIAGNOSIS — F40232 Fear of other medical care: Secondary | ICD-10-CM

## 2022-03-02 DIAGNOSIS — Z636 Dependent relative needing care at home: Secondary | ICD-10-CM | POA: Diagnosis not present

## 2022-03-02 DIAGNOSIS — M26609 Unspecified temporomandibular joint disorder, unspecified side: Secondary | ICD-10-CM

## 2022-03-02 DIAGNOSIS — G9332 Myalgic encephalomyelitis/chronic fatigue syndrome: Secondary | ICD-10-CM | POA: Diagnosis not present

## 2022-03-02 DIAGNOSIS — F331 Major depressive disorder, recurrent, moderate: Secondary | ICD-10-CM

## 2022-03-02 MED ORDER — DIAZEPAM 5 MG PO TABS: 5.0000 mg | ORAL_TABLET | Freq: Three times a day (TID) | ORAL | 3 refills | Status: DC

## 2022-03-02 NOTE — Progress Notes (Signed)
[name] [id number] 09/12/1960 61 y.o.   Subjective:   Patient ID:  Adam Meyers is a 61 y.o. (DOB 01-19-1961) male.  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  Past medical history includes anxiety.   Anxiety Symptoms include nervous/anxious behavior.     Adam Meyers presents for follow-up of OCD and anxiety.  seen November 2020.  No meds were changed at the time.  He was taking Luvox 150 mg a day and diazepam 5 mg at night for sleep  08/13/19 appt with the following noted: Worrying over pending loss of power bc 30 yo mother with him.  Her dementia is progressing.  Has daily help now. Stress living with mother.  Hosp for a month 2020 with cardiac and stroke problems.  Minor stroke.  Caretaking mother  Better TMJ with diazepam at HS.  Fear of the dentist now.  Fear of other doctors also. Was taking diazepam 5 hs for sleep.  Then did ok without it and stopped it. Half the time forgets the am dose of fluvoxamine.  Asks what that might do.  Not sure if anxiety or OCD is worse.  Sometimes lethargic.  Able to get out of the house some now with help.  Some germ issues at the grocery store periodically if everything doesn't go just right. Plan: be CW med.  No changes  02/11/20 appt with the following noted: Only taking fluvoxamine 100 mg daily. Vaccinated July 8, but "pushed every button that I got." for OCD.  Hard to make decisions and second guessing himself.   Son Adam Meyers off to [place] and pt got stomach bug a couple of days later and now fatigued.  That also triggered anxiety.  Being in a hotel room triggered him also.   Plan rec fluvoxamine 150 for better control  07/14/2020 appt noted: Never followed through on increasing fluvoxamine to 150 mg daily.  Doesn't like meds.   HA more frequent lately and  today. Caretaking wearing him down.  Avoids dentist bc OCD.  Chronic anxiety.  Plan: Rec increase fluvoxamine but he complains grogginess and sex SE at higher dosage.  Be consistent with 150 mg daily.  He probably will not.  12/08/20 appt noted: Adam Meyers Retired. Concerns about bruised leg discussed. General health concerns.   Stayed on Luvox 100 daily since here and never increased. Chronic anxiety and some is somatic. Handled recent OCD trigger OK. Fear of dentist but needs to go. Tired all the time. Neighbors bothering sleep occasionally. Plan no med changes  03/10/21 appt noted: Got to dentist but took everything in me bc phobic.  Abcess and root canal.  July 13.  Then FU for normal exam and gum dz.  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 Asks about dental Rx for Flexeril and meloxicam, if it's OK Son at App and 22yo.   Patient reports stable mood and denies depressed or irritable moods.  Patient denies difficulty with sleep initiation or maintenance. 10-11 hours typical lately.  Likes to sleep a lot.  Meyers energy. Brain won't work with less. Strange dreams.  Denies appetite disturbance.  Patient reports that energy and motivation have been good.  Patient denies any difficulty with concentration.  Patient denies any suicidal ideation. Plan: Continue fluvoxamine 150  mg nightly and diazepam 5 mg nightly.  09/29/2021 appointment with the following noted: Continued fluvoxamine 150 mg and diazepam 5 HS. M passed away in 09-12-2022.  She had been helped by vitamin E and Aricept.  Aid found her.  She passed away in her sleep.   Didn't know what to do.  It was fairly "traumatic".  Sister came to the house.   This created more anxiety for about a month. Continues to be restriccted by anxiety.   Living alone and feels ok about it.   Plan: Be consistent with 150 mg daily.  He probably will not. He's stayed on 100 mg daily.  03/02/22 appt noted: [name]  Makes shaving  difficult.  Difficult to eat broad diet and losing weight. Just had PE.  Given Meloxicam. Tried muscle relxers.  Sensitive to loud noises.  Never bothered me before my meltdown.  Past Psychiatric Medication Trials: Paxil Sex SE  Afraid of Zoloft bc mother hallucinated on it. Luvox 200 cog SE  pindolol, propranolol,  diazepam Cyclobenzaprine SE cognitive taken once and never again   Review of Systems:  Review of Systems  Constitutional:  Positive for fatigue.  HENT:  Positive for dental problem. Negative for postnasal drip, rhinorrhea and sneezing.        Jaw pain ongoing  Musculoskeletal:  Positive for arthralgias, gait problem and neck pain.  Skin:  Positive for rash.  Neurological:  Positive for headaches. Negative for tremors and weakness.  Psychiatric/Behavioral:  The patient is nervous/anxious.     Medications: I have reviewed the patient's current medications.  Current Outpatient Medications  Medication Sig Dispense Refill   Cholecalciferol (VITAMIN D) 50 MCG (2000 UT) CAPS Take by mouth.     famotidine (PEPCID) 20 MG tablet Take 20 mg by mouth daily as needed for heartburn or indigestion.     fluvoxaMINE (LUVOX) 100 MG tablet Take 1/2 tablet (50 mg) by mouth every morning and 1 tablet (100 mg) at bedtime (Patient taking differently: 1 TAB HS) 135 tablet 1   ibuprofen (ADVIL) 200 MG tablet Take 600 mg by mouth every 8 (eight) hours as needed for moderate pain.      diazepam (VALIUM) 5 MG tablet Take 1 tablet (5 mg total) by mouth in the morning, at noon, and at bedtime. 90 tablet 3   No current facility-administered medications for this visit.    Medication Side Effects: None  Allergies:  Allergies  Allergen Reactions   Oxycodone-Acetaminophen Itching   Seasonal Ic [Cholestatin]     Drainage,itchy eyes   Gabapentin Anxiety   Percocet [Oxycodone-Acetaminophen] Itching and Anxiety    Past Medical History:  Diagnosis Date   Compressed cervical disc    Cough     Depression    Hyperlipidemia    Nasal congestion    Social anxiety disorder 04/10/2018    Family History  Problem Relation Age of Onset   Cancer Maternal Grandmother        melanoma    Social History   Socioeconomic History   Marital status: Married    Spouse name: Not on file   Number of children: Not on file   Years of education: Not on file   Highest education level: Not on file  Occupational History   Not on file  Tobacco Use   Smoking status: Never   Smokeless tobacco: Never  Substance and Sexual Activity   Alcohol use: No   Drug use: No   Sexual activity:  Not on file  Other Topics Concern   Not on file  Social History Narrative   Not on file   Social Determinants of Health   Financial Resource Strain: Not on file  Food Insecurity: Not on file  Transportation Needs: Not on file  Physical Activity: Not on file  Stress: Not on file  Social Connections: Not on file  Intimate Partner Violence: Not on file    Past Medical History, Surgical history, Social history, and Family history were reviewed and updated as appropriate.   Please see review of systems for further details on the patient's review from today.   Objective:   Physical Exam:  There were no vitals taken for this visit.  Physical Exam Constitutional:      General: He is not in acute distress. Musculoskeletal:        General: No deformity.  Neurological:     Mental Status: He is alert and oriented to person, place, and time.     Cranial Nerves: No dysarthria.     Coordination: Coordination normal.  Psychiatric:        Attention and Perception: Attention and perception normal. He does not perceive auditory or visual hallucinations.        Mood and Affect: Mood is anxious. Mood is not depressed. Affect is not labile, blunt, angry or inappropriate.        Speech: Speech normal.        Behavior: Behavior normal. Behavior is cooperative.        Thought Content: Thought content normal. Thought  content is not paranoid or delusional. Thought content does not include homicidal or suicidal ideation. Thought content does not include suicidal plan.        Cognition and Memory: Cognition and memory normal.        Judgment: Judgment normal.     Comments: Residual chronic OCD but no worse. It still interferes with function and QOL markedly. Chronically fearful of med change. 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 We discussed Adam Meyers has a long history of OCD and a history of depression as well and first came to our practice and April 2014.  He continues in therapy with Dr. Amado Coe.  While he has made a lot of progress and is much less distressed by his OCD.  He remains disabled because of time requirements of the OCD and he is easily triggered into compulsive rituals with high levels of anxiety.  With his limited life demands and relative isolation  he  manages acceptably well.  He does not want to take take more SSRI medication because of sexual side effects are worse at higher dosages.  Supportive therapy dealing with stressors of mother's death and how that plays into fears of med changes.   Also CBT around facing dental phobia.   Last med change was very difficult for him so he doesn't want to pursue it.  Disc his questions around meds RX for dental problems.  Needed reassurance.     Continue meds.  Benefit from meds. Rec Cont it longterm bc high relapse risk.  Emphasized need for consistency.   Answered his questions about need for it longterm medication given severity of his sx historically.    Rec increase fluvoxamine but he complains grogginess and sex SE at higher dosage.  Be consistent with 150 mg daily.  He probably will not. He's stayed on 100 mg daily. Extensive discussion of dx OCD and will likely need meds for OCD for the rest of his life DT disability of OCD.  Option change SSRI to Prozac to reduce fatigue.  Rec it.  He's afraid of Zoloft bc mother had a bad time with it.  He's afraid of switching meds.  Disc general fear of med changes and how it is even effecting Tx of TMJ  Continue diazepam 5 mg HS   Can use prn for dentist.  BC severity TMJ trial diazepam as muscle relaxer.   Disc SE. Can take extra to go to dentist. But dont' drive if drowsy.  Disc dental phobia at length. We discussed the short-term risks associated with benzodiazepines including sedation and increased fall risk among others.  Discussed long-term side effect risk including dependence, potential withdrawal symptoms, and the potential eventual dose-related risk of dementia.  But recent studies from 2020 dispute this association between benzodiazepines and dementia risk. Newer studies in 2020 do not support an association with dementia.  Disc Genesight testing  vs newly FDA approved broader genetic test.  Will defer until next appt  FU 2-3 mos    Meredith Staggers, MD, DFAPA   Please see After Visit Summary for patient specific instructions.  Future Appointments  Date Time Provider Department Center  03/31/2022  3:00 PM [name], PhD [facility] None  04/21/2022  3:00 PM [name], PhD [facility] None  05/11/2022  3:00 PM [name], PhD [facility] None  06/01/2022  3:00 PM [name], PhD [facility] None  06/28/2022  3:00 PM [name], PhD [facility] None  07/20/2022  3:00 PM [name], PhD [facility] None    No orders of the defined types were placed in this encounter.     -------------------------------

## 2022-03-31 ENCOUNTER — Ambulatory Visit (INDEPENDENT_AMBULATORY_CARE_PROVIDER_SITE_OTHER): Payer: Medicare HMO | Admitting: Psychiatry

## 2022-03-31 DIAGNOSIS — F331 Major depressive disorder, recurrent, moderate: Secondary | ICD-10-CM | POA: Diagnosis not present

## 2022-03-31 DIAGNOSIS — G9332 Myalgic encephalomyelitis/chronic fatigue syndrome: Secondary | ICD-10-CM | POA: Diagnosis not present

## 2022-03-31 DIAGNOSIS — F4010 Social phobia, unspecified: Secondary | ICD-10-CM

## 2022-03-31 DIAGNOSIS — F422 Mixed obsessional thoughts and acts: Secondary | ICD-10-CM | POA: Diagnosis not present

## 2022-03-31 DIAGNOSIS — M26609 Unspecified temporomandibular joint disorder, unspecified side: Secondary | ICD-10-CM

## 2022-03-31 DIAGNOSIS — G988 Other disorders of nervous system: Secondary | ICD-10-CM

## 2022-03-31 DIAGNOSIS — F40232 Fear of other medical care: Secondary | ICD-10-CM | POA: Diagnosis not present

## 2022-03-31 DIAGNOSIS — G479 Sleep disorder, unspecified: Secondary | ICD-10-CM

## 2022-03-31 NOTE — Progress Notes (Signed)
Psychotherapy Progress Note [facility] Luan Moore, PhD LP  Patient ID: Adam Meyers)    MRN: [id number] Therapy format: Individual psychotherapy Date: 03/31/2022      Start: 3:20p     Stop: 4:10p     Time Spent: 50 min Location: In-person   Session narrative (presenting needs, interim history, self-report of stressors and symptoms, applications of prior therapy, status changes, and interventions made in session) Poor sleep last night, with neighbors working on their house.  Jaw pain continues.  Losing motivation lately, attrib to the experience of TMJ pain, and secondary tinnitus, wearying him and spoiling some sleep.  Finds himself worrying as well about upcoming health care visits, including apparently worsening need of treatment for an ingrown toenail.  Coached in un-clustering his worries and combatting tendency to agonize how things will go.    Reveals that ever since he broke around 10 years ago, he's been hypersensitive to noises, crowds, commotion.  His fear of people coming in the house is largely about being overwhelmed on a sensory basis, in addition to any contamination phobia.    Discussed household management issues, including a bedroom suite to be sold.  Encouraged in making contacts including consignment that will pick up.  Therapeutic modalities: Cognitive Behavioral Therapy, Solution-Oriented/Positive Psychology, and Ego-Supportive  Mental Status/Observations:  Appearance:   Casual     Behavior:  Appropriate  Motor:  Generally normal, tight/tense  Speech/Language:   Clear and Coherent  Affect:  Appropriate and Constricted  Mood:  anxious and dysthymic  Thought process:  normal  Thought content:    Rumination  Sensory/Perceptual disturbances:    WNL exc hypersensitivity noted  Orientation:  Fully oriented  Attention:  Good    Concentration:  Good  Memory:  WNL  Insight:    Fair  Judgment:   Good  Impulse Control:  Good   Risk  Assessment: Danger to Self: No Self-injurious Behavior: No Danger to Others: No Physical Aggression / Violence: No Duty to Warn: No Access to Firearms a concern: No  Assessment of progress:  stabilized  Diagnosis:   ICD-10-CM   1. Major depressive disorder, recurrent episode, moderate (HCC)  F33.1     2. 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 Social anxiety disorder  F40.10     4. 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 Sleep disturbance  G47.9     6. Chronic fatigue syndrome  G93.32     7. 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  Notice and dispute tendencies to collect troubles and agonize. OCD/phobias -- Self-affirm any positive acts of courage and exposure to uncertainty.  Continue ad lib exposure to feared encounters and services, especially health care.  Dispute fearful thinking about changing providers -- could be as good or better. Medical needs -- Carry through all recommended foot and dental care.  Continue self-care for IBS-D and hemorrhoid procedure, get more active as able.  Follow through with planned colonoscopy.  Diversify nutrition as tolerated, then keep pushing variety and vegetable sources again.  Add probiotic, yogurt make sense as delivery system.  Worth testing for deficiencies with PCP if not done already -- suspect D, B12 -- and consider magnesium for muscle tension and anxiety help.   Priority on TMJ care --  Look into topical antiinflammatory, use prior recommendation to warm compresses, experiment with gentle self-massage, and hopefully establish better muscle relaxant strategy.  For now, foods may need to be softer as well.  Most likely will need to dare to open his mouth widely enough to make a suitable bite guard. Medication management -- Given strong personal and family history of unusual medication reactions and the [facility]  system's endorsement of genetic testing, recommend having it done through PCP or psychiatry. Disability -- Remains disabled from any occupation due to overall symptom severity Other recommendations/advice as may be noted above Continue to utilize previously learned skills ad lib Maintain medication as prescribed and work faithfully with relevant prescriber(s) if any changes are desired or seem indicated Call the clinic on-call service, 988/hotline, 911, or present to [facility] or ER if any life-threatening psychiatric crisis Return for session(s) already scheduled. Already scheduled visit in this office 04/21/2022.  Blanchie Serve, PhD Luan Moore, PhD LP Clinical Psychologist, [facility] [address], [place], [place] [address] [phone]

## 2022-04-21 ENCOUNTER — Ambulatory Visit: Payer: Medicare HMO | Admitting: Psychiatry

## 2022-04-27 ENCOUNTER — Ambulatory Visit: Payer: Self-pay | Admitting: Podiatry

## 2022-05-02 ENCOUNTER — Encounter: Payer: Self-pay | Admitting: Podiatry

## 2022-05-02 ENCOUNTER — Ambulatory Visit: Payer: Medicare HMO | Admitting: Podiatry

## 2022-05-02 DIAGNOSIS — M79676 Pain in unspecified toe(s): Secondary | ICD-10-CM

## 2022-05-02 DIAGNOSIS — B351 Tinea unguium: Secondary | ICD-10-CM

## 2022-05-02 DIAGNOSIS — L603 Nail dystrophy: Secondary | ICD-10-CM | POA: Diagnosis not present

## 2022-05-03 NOTE — Progress Notes (Signed)
Subjective:  Patient ID: Adam Meyers, male    DOB: June 14, 1961,  MRN: [id number] HPI Chief Complaint  Patient presents with   Nail Problem    Hallux left - thick, discolored nail x years, had ingrown procedures years ago, but since has grown very thick and has now curled, HIGH ANXIETY    New Patient (Initial Visit)    61 y.o. male presents with the above complaint.   ROS: Denies fever chills nausea vomiting muscle aches pains calf pain back pain chest pain shortness of breath.  Past Medical History:  Diagnosis Date   Compressed cervical disc    Cough    Depression    Hyperlipidemia    Nasal congestion    Social anxiety disorder 04/10/2018   Past Surgical History:  Procedure Laterality Date   bone spur removal  2000   right shoulder   left knee surgery  1990   arthroscopic   TONSILLECTOMY  1970    Current Outpatient Medications:    ascorbic acid (VITAMIN C) 500 MG tablet, Take 500 mg by mouth daily., Disp: , Rfl:    Cholecalciferol (VITAMIN D) 50 MCG (2000 UT) CAPS, Take by mouth., Disp: , Rfl:    diazepam (VALIUM) 5 MG tablet, Take 1 tablet (5 mg total) by mouth in the morning, at noon, and at bedtime., Disp: 90 tablet, Rfl: 3   famotidine (PEPCID) 20 MG tablet, Take 20 mg by mouth daily as needed for heartburn or indigestion., Disp: , Rfl:    fluvoxaMINE (LUVOX) 100 MG tablet, Take 1/2 tablet (50 mg) by mouth every morning and 1 tablet (100 mg) at bedtime (Patient taking differently: 1 TAB HS), Disp: 135 tablet, Rfl: 1   ibuprofen (ADVIL) 200 MG tablet, Take 600 mg by mouth every 8 (eight) hours as needed for moderate pain. , Disp: , Rfl:    meloxicam (MOBIC) 7.5 MG tablet, Take 7.5 mg by mouth daily., Disp: , Rfl:   Allergies  Allergen Reactions   Ciprofloxacin    Cyclobenzaprine    Oxycodone-Acetaminophen Itching   Seasonal Ic [Cholestatin]     Drainage,itchy eyes   Gabapentin Anxiety   Percocet [Oxycodone-Acetaminophen] Itching and Anxiety   Review of  Systems Objective:  There were no vitals filed for this visit.  General: Well developed, nourished, in no acute distress, alert and oriented x3   Dermatological: Skin is warm, dry and supple bilateral. Nails x 10 are well maintained; remaining integument appears unremarkable at this time. There are no open sores, no preulcerative lesions, no rash or signs of infection present.  All of his nails look perfect with the exception of the hallux left.  Which does demonstrate a severely elongated thickened discolored painful dystrophic nail that is actually growing in impressing on the skin along the lateral tip of the toe.  Vascular: Dorsalis Pedis artery and Posterior Tibial artery pedal pulses are 2/4 bilateral with immedate capillary fill time. Pedal hair growth present. No varicosities and no lower extremity edema present bilateral.   Neruologic: Grossly intact via light touch bilateral. Vibratory intact via tuning fork bilateral. Protective threshold with Semmes Wienstein monofilament intact to all pedal sites bilateral. Patellar and Achilles deep tendon reflexes 2+ bilateral. No Babinski or clonus noted bilateral.   Musculoskeletal: No gross boney pedal deformities bilateral. No pain, crepitus, or limitation noted with foot and ankle range of motion bilateral. Muscular strength 5/5 in all groups tested bilateral.  Gait: Unassisted, Nonantalgic.    Radiographs:  None taken  Assessment &  Plan:   Assessment: Nail dystrophy hallux left  Plan: Debridement of the single nail I will follow-up with him on an as-needed basis for nail debridement.  High anxiety level very anxious person.     Victorian Gunn T. [place], [place]

## 2022-05-11 ENCOUNTER — Ambulatory Visit: Payer: Medicare HMO | Admitting: Psychiatry

## 2022-05-11 DIAGNOSIS — M26609 Unspecified temporomandibular joint disorder, unspecified side: Secondary | ICD-10-CM

## 2022-05-11 DIAGNOSIS — F422 Mixed obsessional thoughts and acts: Secondary | ICD-10-CM | POA: Diagnosis not present

## 2022-05-11 DIAGNOSIS — G9332 Myalgic encephalomyelitis/chronic fatigue syndrome: Secondary | ICD-10-CM | POA: Diagnosis not present

## 2022-05-11 DIAGNOSIS — G988 Other disorders of nervous system: Secondary | ICD-10-CM

## 2022-05-11 DIAGNOSIS — G4721 Circadian rhythm sleep disorder, delayed sleep phase type: Secondary | ICD-10-CM | POA: Diagnosis not present

## 2022-05-11 DIAGNOSIS — F331 Major depressive disorder, recurrent, moderate: Secondary | ICD-10-CM

## 2022-05-11 DIAGNOSIS — F40232 Fear of other medical care: Secondary | ICD-10-CM | POA: Diagnosis not present

## 2022-05-11 DIAGNOSIS — F4010 Social phobia, unspecified: Secondary | ICD-10-CM | POA: Diagnosis not present

## 2022-05-11 NOTE — Progress Notes (Signed)
Psychotherapy Progress Note [facility] [name], PhD LP  Patient ID: Adam Meyers)    MRN: [id number] Therapy format: Individual psychotherapy Date: 05/11/2022      Start: 3:25p     Stop: 4:15p     Time Spent: 50 min Location: In-person   Session narrative (presenting needs, interim history, self-report of stressors and symptoms, applications of prior therapy, status changes, and interventions made in session) Interested in [place] for further help.  Looked up criteria in session, found he is not eligible, but very glad to have it clarified and settle the nagging doubt.  Feeling loopier today for sleep interruption d/t pain and nearby activity outside.  Irritated by each, but does know pain is a work in progress and his delayed-sleep habit does not dictate when neighbors or outdoor workers should be quiet.    Good development in podiatry, had a toenail cut back that has apparently died, in part, but was too long and irritating.  Feels a "2000-lb weight" lifted.  Dentist Nov 1, with anxiety expected, but victories to be had in keeping the appt, allowing the work, and trusting less familiar people to care and lay hands on him until experience reassured they are kind, effective, and not putting him back through damaging handling like the chiropractor did a decade ago.  Colonoscopy now scheduled for Dec 7, but will delay to Feb if able.  Leery of doing the prep, and annoyed that it's more complicated now, plus he generally wants to put distance between his last anxious challenges and the next.  Affirmed courage in action and better letting things be done that he needs for health care, encouraged to go ahead and let colonoscopy happen, accept prep as the temporary experience it is and if needed minimize working his jaw in any way possible.   Meanwhile, [name] is in touch more often lately.  Discussed mix of feelings toward her at this time, including missing her  companionship and ready help as he had earlier in their marriage.  Bittersweet to see her doing better about handling her own finances, and better able to be charitable with him after apparently sorting out her resentments and fears better.  Normalized mixed feelings, confirmed he is not in danger of making bad decisions or re-victimizing himself by relating to her, just actually achieving the role of friends and co-parents despite irreconcilable history.  Therapeutic modalities: Cognitive Behavioral Therapy, Solution-Oriented/Positive Psychology, Environmental manager, and Faith-sensitive  Mental Status/Observations:  Appearance:   Casual     Behavior:  Appropriate  Motor:  Normal and if stiff  Speech/Language:   Clear and Coherent  Affect:  Appropriate and Constricted  Mood:  anxious and dysthymic  Thought process:  normal  Thought content:    Rumination and less  Sensory/Perceptual disturbances:    WNL  Orientation:  Fully oriented  Attention:  Good    Concentration:  Good  Memory:  WNL  Insight:    Fair  Judgment:   Good  Impulse Control:  Good   Risk Assessment: Danger to Self: No Self-injurious Behavior: No Danger to Others: No Physical Aggression / Violence: No Duty to Warn: No Access to Firearms a concern: No  Assessment of progress:  progressing  Diagnosis:   ICD-10-CM   1. Major depressive disorder, recurrent episode, moderate (HCC)  F33.1     2. 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 Phobia of dental and other procedures  F40.232  4. Social anxiety disorder  F40.10     5. 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 Sleep phase syndrome, delayed  G47.21     7. Chronic fatigue syndrome  G93.32     8. 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  Notice and dispute tendencies to collect troubles and agonize. OCD/phobias -- Self-affirm any positive acts of courage and exposure to  uncertainty.  Continue ad lib exposure to feared encounters and services, especially health care.  Dispute fearful thinking about changing providers -- could be as good or better. Medical needs -- Carry through all recommended foot and dental care.  Continue self-care for IBS-D and hemorrhoid procedure, get more active as able.  Follow through with planned colonoscopy.  Diversify nutrition as tolerated, then keep pushing variety and vegetable sources again.  Add probiotic, yogurt make sense as delivery system.  Worth testing for deficiencies with PCP if not done already -- suspect D, B12 -- and consider magnesium for muscle tension and anxiety help.   Priority on TMJ care -- Look into topical antiinflammatory, use prior recommendation to warm compresses, experiment with gentle self-massage, and hopefully establish better muscle relaxant strategy.  For now, foods may need to be softer as well.  Most likely will need to dare to open his mouth widely enough to make a suitable bite guard. Medication management -- Given strong personal and family history of unusual medication reactions and the [facility] system's endorsement of genetic testing, recommend having it done through PCP or psychiatry. Disability -- Remains disabled from any occupation due to overall symptom severity Other recommendations/advice as may be noted above Continue to utilize previously learned skills ad lib Maintain medication as prescribed and work faithfully with relevant prescriber(s) if any changes are desired or seem indicated Call the clinic on-call service, 988/hotline, 911, or present to [facility] or ER if any life-threatening psychiatric crisis Return for as already scheduled. Already scheduled visit in this office 05/24/2022.  [name], PhD [name], PhD LP Clinical Psychologist, [facility] [address] [place], [place] [address] [phone]

## 2022-05-24 ENCOUNTER — Ambulatory Visit (INDEPENDENT_AMBULATORY_CARE_PROVIDER_SITE_OTHER): Payer: Medicare HMO | Admitting: Psychiatry

## 2022-05-24 ENCOUNTER — Encounter: Payer: Self-pay | Admitting: Psychiatry

## 2022-05-24 DIAGNOSIS — G4721 Circadian rhythm sleep disorder, delayed sleep phase type: Secondary | ICD-10-CM | POA: Diagnosis not present

## 2022-05-24 DIAGNOSIS — F422 Mixed obsessional thoughts and acts: Secondary | ICD-10-CM

## 2022-05-24 DIAGNOSIS — F40232 Fear of other medical care: Secondary | ICD-10-CM

## 2022-05-24 DIAGNOSIS — F5105 Insomnia due to other mental disorder: Secondary | ICD-10-CM | POA: Diagnosis not present

## 2022-05-24 DIAGNOSIS — G9332 Myalgic encephalomyelitis/chronic fatigue syndrome: Secondary | ICD-10-CM | POA: Diagnosis not present

## 2022-05-24 DIAGNOSIS — F4010 Social phobia, unspecified: Secondary | ICD-10-CM

## 2022-05-24 DIAGNOSIS — F331 Major depressive disorder, recurrent, moderate: Secondary | ICD-10-CM

## 2022-05-24 NOTE — Progress Notes (Signed)
Adam LowJames K Stjulien [id number] 11/12/1960 61 y.o.   Subjective:   Patient ID:  Adam Meyers is a 61 y.o. (DOB 05/21/1961) male.  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  Past medical history includes anxiety.   Anxiety Symptoms include nervous/anxious behavior.     Adam Meyers presents for follow-up of OCD and anxiety.  seen November 2020.  No meds were changed at the time.  He was taking Luvox 150 mg a day and diazepam 5 mg at night for sleep  08/13/19 appt with the following noted: Worrying over pending loss of power bc 59 yo mother with him.  Her dementia is progressing.  Has daily help now. Stress living with mother.  Hosp for a month 2020 with cardiac and stroke problems.  Minor stroke.  Caretaking mother  Better TMJ with diazepam at HS.  Fear of the dentist now.  Fear of other doctors also. Was taking diazepam 5 hs for sleep.  Then did ok without it and stopped it. Half the time forgets the am dose of fluvoxamine.  Asks what that might do.  Not sure if anxiety or OCD is worse.  Sometimes lethargic.  Able to get out of the house some now with help.  Some germ issues at the grocery store periodically if everything doesn't go just right. Plan: be CW med.  No changes  02/11/20 appt with the following noted: Only taking fluvoxamine 100 mg daily. Vaccinated July 8, but "pushed every button that I got." for OCD.  Hard to make decisions and second guessing himself.   [place] off to [place] and pt got stomach bug a couple of days later and now fatigued.  That also triggered anxiety.  Being in a hotel room triggered him also.   Plan rec fluvoxamine 150 for better control  07/14/2020 appt noted: Never followed through on increasing fluvoxamine to 150 mg daily.  Doesn't like meds.   HA more frequent lately and  today. Caretaking wearing him down.  Avoids dentist bc OCD.  Chronic anxiety.  Plan: Rec increase fluvoxamine but he complains grogginess and sex SE at higher dosage.  Be consistent with 150 mg daily.  He probably will not.  12/08/20 appt noted: Adam Meyers Retired. Concerns about bruised leg discussed. General health concerns.   Stayed on Luvox 100 daily since here and never increased. Chronic anxiety and some is somatic. Handled recent OCD trigger OK. Fear of dentist but needs to go. Tired all the time. Neighbors bothering sleep occasionally. Plan no med changes  03/10/21 appt noted: Got to dentist but took everything in me bc phobic.  Abcess and root canal.  July 13.  Then FU for normal exam and gum dz.  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 Asks about dental Rx for Flexeril and meloxicam, if it's OK Son at App and 22yo.   Patient reports stable mood and denies depressed or irritable moods.  Patient denies difficulty with sleep initiation or maintenance. 10-11 hours typical lately.  Likes to sleep a lot.  Low energy. Brain won't work with less. Strange dreams.  Denies appetite disturbance.  Patient reports that energy and motivation have been good.  Patient denies any difficulty with concentration.  Patient denies any suicidal ideation. Plan: Continue fluvoxamine 150 mg nightly and  diazepam 5 mg nightly.  09/29/2021 appointment with the following noted: Continued fluvoxamine 150 mg and diazepam 5 HS. M passed away in 07-Sep-2022.  She had been helped by vitamin E and Aricept.  Aid found her.  She passed away in her sleep.   Didn't know what to do.  It was fairly "traumatic".  Sister came to the house.   This created more anxiety for about a month. Continues to be restriccted by anxiety.   Living alone and feels ok about it.   Plan: Be consistent with 150 mg daily.  He probably will not. He's stayed on 100 mg daily.  03/02/22 appt noted: [name]  Makes shaving  difficult.  Difficult to eat broad diet and losing weight. Just had PE.  Given Meloxicam. Tried muscle relxers.  05/24/22 appt noted: Stayed with same meds.   Disc his concerns bc pharmacy scared him about BZ.   Spooked him. Still having TMJ issues.  Still a lot of trouble with pain and difficulty shaving.   Never tried more diazepam than current 5 mg HS.  Sensitive to loud noises.  Never bothered me before my meltdown.  Past Psychiatric Medication Trials: Paxil Sex SE  Afraid of Zoloft bc mother hallucinated on it. Luvox 200 cog SE  pindolol, propranolol,  diazepam Cyclobenzaprine SE cognitive taken once and never again   Review of Systems:  Review of Systems  Constitutional:  Positive for fatigue.  HENT:  Positive for dental problem and tinnitus. Negative for postnasal drip, rhinorrhea and sneezing.        Jaw pain ongoing  Musculoskeletal:  Positive for arthralgias, gait problem and neck pain.  Skin:  Positive for rash.  Neurological:  Positive for headaches. Negative for tremors and weakness.  Psychiatric/Behavioral:  The patient is nervous/anxious.     Medications: I have reviewed the patient's current medications.  Current Outpatient Medications  Medication Sig Dispense Refill   ascorbic acid (VITAMIN C) 500 MG tablet Take 500 mg by mouth daily.     Cholecalciferol (VITAMIN D) 50 MCG (2000 UT) CAPS Take by mouth.     diazepam (VALIUM) 5 MG tablet Take 1 tablet (5 mg total) by mouth in the morning, at noon, and at bedtime. (Patient taking differently: Take 5 mg by mouth in the morning, at noon, and at bedtime. 1 tab HS) 90 tablet 3   famotidine (PEPCID) 20 MG tablet Take 20 mg by mouth daily as needed for heartburn or indigestion.     fluvoxaMINE (LUVOX) 100 MG tablet Take 1/2 tablet (50 mg) by mouth every morning and 1 tablet (100 mg) at bedtime (Patient taking differently: 1 TAB HS) 135 tablet 1   ibuprofen (ADVIL) 200 MG tablet Take 600 mg by mouth every 8 (eight)  hours as needed for moderate pain.      meloxicam (MOBIC) 7.5 MG tablet Take 7.5 mg by mouth daily.     No current facility-administered medications for this visit.    Medication Side Effects: None  Allergies:  Allergies  Allergen Reactions   Ciprofloxacin    Cyclobenzaprine    Oxycodone-Acetaminophen Itching   Seasonal Ic [Cholestatin]     Drainage,itchy eyes   Gabapentin Anxiety   Percocet [Oxycodone-Acetaminophen] Itching and Anxiety    Past Medical History:  Diagnosis Date   Compressed cervical disc    Cough    Depression    Hyperlipidemia    Nasal congestion    Social anxiety disorder 04/10/2018  Family History  Problem Relation Age of Onset   Cancer Maternal Grandmother        melanoma    Social History   Socioeconomic History   Marital status: Married    Spouse name: Not on file   Number of children: Not on file   Years of education: Not on file   Highest education level: Not on file  Occupational History   Not on file  Tobacco Use   Smoking status: Never   Smokeless tobacco: Never  Substance and Sexual Activity   Alcohol use: No   Drug use: No   Sexual activity: Not on file  Other Topics Concern   Not on file  Social History Narrative   Not on file   Social Determinants of Health   Financial Resource Strain: Not on file  Food Insecurity: Not on file  Transportation Needs: Not on file  Physical Activity: Not on file  Stress: Not on file  Social Connections: Not on file  Intimate Partner Violence: Not on file    Past Medical History, Surgical history, Social history, and Family history were reviewed and updated as appropriate.   Please see review of systems for further details on the patient's review from today.   Objective:   Physical Exam:  There were no vitals taken for this visit.  Physical Exam Constitutional:      General: He is not in acute distress. Musculoskeletal:        General: No deformity.  Neurological:      Mental Status: He is alert and oriented to person, place, and time.     Cranial Nerves: No dysarthria.     Coordination: Coordination normal.  Psychiatric:        Attention and Perception: Attention and perception normal. He does not perceive auditory or visual hallucinations.        Mood and Affect: Mood is anxious. Mood is not depressed. Affect is not labile, blunt, angry or inappropriate.        Speech: Speech normal.        Behavior: Behavior normal. Behavior is cooperative.        Thought Content: Thought content normal. Thought content is not paranoid or delusional. Thought content does not include homicidal or suicidal ideation. Thought content does not include suicidal plan.        Cognition and Memory: Cognition and memory normal.        Judgment: Judgment normal.     Comments: Residual chronic OCD but no worse. It still interferes with function and QOL markedly. Chronically fearful of med change and even dosage changes Preoccupied with TMJ and meds with repeated questions. 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 We discussed  Adam Meyers has a  long history of OCD and a history of depression as well and first came to our practice and April 2014.  He continues in therapy with Dr. Amado Coe.  While he has made a lot of progress and is much less distressed by his OCD.  He remains disabled because of time requirements of the OCD and he is easily triggered into compulsive rituals with high levels of anxiety.  With his limited life demands and relative isolation he manages acceptably well.  He does not want to take take more SSRI medication because of sexual side effects are worse at higher dosages.  Supportive therapy dealing with stressors of mother's death and how that plays into fears of med changes.   Also CBT around facing dental phobia.   Last med change was very difficult for him so he doesn't want to pursue it.  Disc his questions around meds RX for dental problems.  Needed reassurance.     Continue meds.  Benefit from meds. Rec Cont it longterm bc high relapse risk.  Emphasized need for consistency.   Answered his questions about need for it longterm medication given severity of his sx historically.    Rec increase fluvoxamine but he complains grogginess and sex SE at higher dosage.  Be consistent with 150 mg daily.  He probably will not. He's stayed on 100 mg daily. Extensive discussion of dx OCD and will likely need meds for OCD for the rest of his life DT disability of OCD.  Option change SSRI to Prozac to reduce fatigue.  Rec it.  He's afraid of Zoloft bc mother had a bad time with it.  He's afraid of switching meds.  Disc general fear of med changes and how it is even effecting Tx of TMJ  Continue diazepam 5 mg HS   Can use prn for dentist.  BC severity TMJ trial diazepam as muscle relaxer.  Educated about BZ and some fears about it .  Repeated instructions several times.    Disc SE. Can take extra to go to dentist. Rec trial for TMJ as noted before.  Less risk than alternatives and TMJ is highly troublesome.  But dont'  drive if drowsy.  Disc dental phobia at length. We discussed the short-term risks associated with benzodiazepines including sedation and increased fall risk among others.  Discussed long-term side effect risk including dependence, potential withdrawal symptoms, and the potential eventual dose-related risk of dementia.  But recent studies from 2020 dispute this association between benzodiazepines and dementia risk. Newer studies in 2020 do not support an association with dementia.  Disc Genesight testing  vs newly FDA approved broader genetic test.  Will defer  FU 2-3 mos    Meredith Staggers, MD, DFAPA   Please see After Visit Summary for patient specific instructions.  Future Appointments  Date Time Provider Department Center  06/01/2022  3:00 PM [name], PhD [facility] None  06/28/2022  3:00 PM [name], PhD [facility] None  07/20/2022  3:00 PM [name], PhD [facility] None  08/02/2022  3:00 PM [name], PhD CP[facility] None  08/24/2022  3:00 PM [name], PhD CP[facility] None  09/14/2022  3:00 PM [name], PhD CP-[facility] None    No orders of the defined types were placed in this encounter.     -------------------------------

## 2022-06-01 ENCOUNTER — Ambulatory Visit (INDEPENDENT_AMBULATORY_CARE_PROVIDER_SITE_OTHER): Payer: Medicare HMO | Admitting: Psychiatry

## 2022-06-01 DIAGNOSIS — F422 Mixed obsessional thoughts and acts: Secondary | ICD-10-CM | POA: Diagnosis not present

## 2022-06-01 DIAGNOSIS — G9332 Myalgic encephalomyelitis/chronic fatigue syndrome: Secondary | ICD-10-CM | POA: Diagnosis not present

## 2022-06-01 DIAGNOSIS — M26609 Unspecified temporomandibular joint disorder, unspecified side: Secondary | ICD-10-CM | POA: Diagnosis not present

## 2022-06-01 DIAGNOSIS — F4010 Social phobia, unspecified: Secondary | ICD-10-CM

## 2022-06-01 DIAGNOSIS — G988 Other disorders of nervous system: Secondary | ICD-10-CM

## 2022-06-01 DIAGNOSIS — F331 Major depressive disorder, recurrent, moderate: Secondary | ICD-10-CM

## 2022-06-01 NOTE — Progress Notes (Signed)
Psychotherapy Progress Note [facility] Luan Moore, PhD LP  Patient ID: Adam Meyers)    MRN: [id number] Therapy format: Individual psychotherapy Date: 06/01/2022      Start: 3:20p     Stop: 4:10p     Time Spent: 50 min Location: In-person   Session narrative (presenting needs, interim history, self-report of stressors and symptoms, applications of prior therapy, status changes, and interventions made in session) Wound up going to [place], as invited by [facility], the three of them.  Nice to get out of the house.  Handled a bit of nausea, and not too provoked about having clean toilet facilities.  Relieved not to be overwhelmed with sentimentality, either on his own or from [facility], and sees worry about her feelings being another "paper tiger" dealt with.  Extra nice to have [place] home for TG, the first TG without his mother.  Logistics last year involved delay after delay from sister, pushing the meal to 9pm; this year similar, managed 8pm, but much more tolerable with [name] home.  Credit both to [place] being a little more organized, and Yvone Neu less agonized.  Easier to see sister with [name] care needs no longer at issue, just a potential issue with paying property taxes.  If [place] does press him to take it on himself rather then the estate, clarified rational basis for answering, just cautioned not to assume the worse about what's coming.  Stressful development in that [name] says he got a car, turns out he's very interested and [name] put down a deposit, half-obligating Yvone Neu to research safety, etc., which triggered a long night of tossing and turning, worrying they were missing something.  Ultimately he took 3 days to work through the uncertainty, but successfully communicated with [name] and gave his blessing.  More than that, he was surprised to have such a wave of anxiety/uncertainty like he had, figuring he had overcome it by now.  Interpreted he still has the same underlying  OCD vulnerability, just tamped down by medication and largely spared having to face strong provocations for a while now, and this one was activated by his very natural, deep-running sense of responsibility trying to guide and ensure safety for his only child, even if he didn't have his own money at stake.  Acknowledged having a strong feeling something could go wrong, worrying about [name] [place] the [place], and having really wanted to wait to find a car with side curtain air bags, even though that would mean a more recent, more expensive model.  Assured that he can/will, with further practice, take on new surprises and implied responsibilities, as fast as he can receive new surprises and implied responsibilities.  For now, what he can do is reimagine facing these things after he gets them, and mentally practice reminding himself that with OCD, just because it's "loud" doesn't mean it's true, and his goal is to find "good enough", suspend judgment about what catastrophe may befall, and move on with letting it be settled.  Normalized the exhaustion afterward -- highly unwanted -- as very tangible, parasympathetic rebound.  Reminded him he has always wanted to prevent feeling distressed, when the task is to allow it to pass through and become more resilient about when he does.  TMJ remains provocative.  Cn only shave every several days, and conversations like today's wear down his resistance to pain.  Therapeutic modalities: Cognitive Behavioral Therapy, Solution-Oriented/Positive Psychology, and Ego-Supportive  Mental Status/Observations:  Appearance:  Casual     Behavior:  Appropriate  Motor:  Normal exc required hard chair for back  Speech/Language:   Clear and Coherent  Affect:  Appropriate  Mood:  anxious and dysthymic  Thought process:  normal  Thought content:    Obsessions  Sensory/Perceptual disturbances:    Intact, chronic hypersensitivities  Orientation:  Fully oriented   Attention:  Good    Concentration:  Good  Memory:  WNL  Insight:    Fair  Judgment:   Good  Impulse Control:  Good   Risk Assessment: Danger to Self: No Self-injurious Behavior: No Danger to Others: No Physical Aggression / Violence: No Duty to Warn: No Access to Firearms a concern: No  Assessment of progress:  stabilized  Diagnosis:   ICD-10-CM   1. 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 Major depressive disorder, recurrent episode, moderate (HCC)  F33.1     3. Social anxiety disorder  F40.10     4. 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 Chronic fatigue syndrome  G93.32     6. 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  Notice and dispute tendencies to collect troubles and agonize, try not to feed dread, just do what's reasonable. OCD/phobias -- Self-affirm any positive acts of courage and exposure to uncertainty.  Continue ad lib exposure to feared encounters and services, especially health care.  Dispute fearful thinking about changing providers -- could be as good or better.  Maintain perspective "Just because it's loud doesn't mean it's true", the task is to let anxiety come and cope with it, not prevent all uneasiness, and find a "good enough" point.  Once the shock of new obsessive episodes passes, consider imaginal "do overs" for situations as approximate practice for new surprises. Medical needs -- Carry through all recommended foot and dental care.  Continue self-care for IBS-D and hemorrhoid procedure, get more active as able.  Follow through with planned colonoscopy.  Diversify nutrition as tolerated, then keep pushing variety and vegetable sources again.  Add probiotic, yogurt make sense as delivery system.  Worth testing for deficiencies with PCP if not done already -- suspect D, B12 -- and consider magnesium for muscle tension and anxiety help.   Priority on TMJ  care -- Look into topical antiinflammatory, use prior recommendation to warm compresses, experiment with gentle self-massage, and hopefully establish better muscle relaxant strategy.  For now, foods may need to be softer as well.  Most likely will need to dare to open his mouth widely enough to make a suitable bite guard. Medication management -- Given strong personal and family history of unusual medication reactions and the [facility] system's endorsement of genetic testing, recommend having it done through PCP or psychiatry. Disability -- Remains disabled from any occupation due to overall symptom severity Other recommendations/advice as may be noted above Continue to utilize previously learned skills ad lib Maintain medication as prescribed and work faithfully with relevant prescriber(s) if any changes are desired or seem indicated Call the clinic on-call service, 988/hotline, 911, or present to [facility] or ER if any life-threatening psychiatric crisis Return for as already scheduled. Already scheduled visit in this office 06/28/2022.  Blanchie Serve, PhD Luan Moore, PhD LP Clinical Psychologist, [facility] [address] [place], [place] [address] [phone]

## 2022-06-28 ENCOUNTER — Ambulatory Visit (INDEPENDENT_AMBULATORY_CARE_PROVIDER_SITE_OTHER): Payer: Medicare HMO | Admitting: Psychiatry

## 2022-06-28 DIAGNOSIS — G9332 Myalgic encephalomyelitis/chronic fatigue syndrome: Secondary | ICD-10-CM

## 2022-06-28 DIAGNOSIS — F331 Major depressive disorder, recurrent, moderate: Secondary | ICD-10-CM | POA: Diagnosis not present

## 2022-06-28 DIAGNOSIS — F422 Mixed obsessional thoughts and acts: Secondary | ICD-10-CM | POA: Diagnosis not present

## 2022-06-28 DIAGNOSIS — M26609 Unspecified temporomandibular joint disorder, unspecified side: Secondary | ICD-10-CM

## 2022-06-28 DIAGNOSIS — G988 Other disorders of nervous system: Secondary | ICD-10-CM

## 2022-06-28 NOTE — Progress Notes (Unsigned)
Psychotherapy Progress Note [facility] Luan Moore, PhD LP  Patient ID: Adam Meyers)    MRN: [id number] Therapy format: Individual psychotherapy Date: 06/28/2022      Start: 3:10p     Stop: 3:55p     Time Spent: 45 min Location: In-person   Session narrative (presenting needs, interim history, self-report of stressors and symptoms, applications of prior therapy, status changes, and interventions made in session) TMJ acting up intensely, almost cancelled.  Talking exacerbates it.  "I survived the holidays, almost."  Apprehension about a postponed family gathering (relative had COVID), which he usually enjoys, but pain flareup makes intimidating.  Cheeks hurt and ears ring when it's this bad, and it's started to interrupt sleep.  Has migrated more to text communications where possible.  Now figuring he will need to contact a specialist.  Discussed likely need for more specialty care of TMJ and avenues that may be available.  Enjoyed [place] home for part of the holiday break.  Restful to hold off attending [place] Eve service and to have the family gathering pp'd.  No friction nor anxiety with [name].    Therapeutic modalities: Cognitive Behavioral Therapy, Solution-Oriented/Positive Psychology, and Ego-Supportive  Mental Status/Observations:  Appearance:   Casual     Behavior:  Appropriate  Motor:  Normal and tense  Speech/Language:   Clear and Coherent and affected by jaw pain  Affect:  Appropriate  Mood:  anxious and dysthymic  Thought process:  normal  Thought content:    Obsessions  Sensory/Perceptual disturbances:    pain  Orientation:  Fully oriented  Attention:  Good    Concentration:  Fair  Memory:  WNL  Insight:    Good  Judgment:   fair  Impulse Control:  Good   Risk Assessment: Danger to Self: No Self-injurious Behavior: No Danger to Others: No Physical Aggression / Violence: No Duty to Warn: No Access to Firearms a concern:  No  Assessment of progress:  stabilized  Diagnosis:   ICD-10-CM   1. Major depressive disorder, recurrent episode, moderate (HCC)  F33.1     2. 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 Chronic fatigue syndrome  G93.32      Plan:  Mood/morale -- Try to start the day earlier, for mood and anxiety benefit.  Notice and dispute tendencies to collect troubles and agonize, try not to feed dread, just do what's reasonable. OCD/phobias -- Self-affirm any positive acts of courage and exposure to uncertainty.  Continue ad lib exposure to feared encounters and services, especially health care.  Dispute fearful thinking about changing providers -- could be as good or better.  Maintain perspective "Just because it's loud doesn't mean it's true", the task is to let anxiety come and cope with it, not prevent all uneasiness, and find a "good enough" point.  Once the shock of new obsessive episodes passes, consider imaginal "do overs" for situations as approximate practice for new surprises. Medical needs -- Carry through all recommended foot and dental care.  Continue self-care for IBS-D and hemorrhoid procedure, get more active as able.  Follow through with planned colonoscopy.  Diversify nutrition as tolerated, then keep pushing variety and vegetable sources again.  Add probiotic, yogurt make sense as delivery system.  Worth testing for deficiencies with PCP if not done already -- suspect D, B12 --  and consider magnesium for muscle tension and anxiety help.   Priority on TMJ care -- Look into topical antiinflammatory, use prior recommendation to warm compresses, experiment with gentle self-massage, and hopefully establish better muscle relaxant strategy.  For now, foods may need to be softer as well.  Most likely will need to dare to open his mouth widely enough to make a suitable bite guard.  Should seek  specialty care. Medication management -- Given strong personal and family history of unusual medication reactions and the [facility]'s endorsement of genetic testing, recommend having it done through PCP or psychiatry. Disability -- Remains disabled from any occupation due to overall symptom severity Other recommendations/advice as may be noted above Continue to utilize previously learned skills ad lib Maintain medication as prescribed and work faithfully with relevant prescriber(s) if any changes are desired or seem indicated Call the clinic on-call service, 988/hotline, 911, or present to [facility] or ER if any life-threatening psychiatric crisis Return for time as already scheduled. Already scheduled visit in this office 07/20/2022.  Blanchie Serve, PhD Luan Moore, PhD LP Clinical Psychologist, [facility] Group [facility] [address] [place], [place] [address] [phone]

## 2022-07-20 ENCOUNTER — Ambulatory Visit: Payer: Medicare HMO | Admitting: Psychiatry

## 2022-07-27 ENCOUNTER — Other Ambulatory Visit: Payer: Self-pay | Admitting: Psychiatry

## 2022-07-27 DIAGNOSIS — F3341 Major depressive disorder, recurrent, in partial remission: Secondary | ICD-10-CM

## 2022-07-27 DIAGNOSIS — F422 Mixed obsessional thoughts and acts: Secondary | ICD-10-CM

## 2022-07-27 DIAGNOSIS — F4010 Social phobia, unspecified: Secondary | ICD-10-CM

## 2022-08-02 ENCOUNTER — Ambulatory Visit: Payer: Medicare HMO | Admitting: Psychiatry

## 2022-08-02 DIAGNOSIS — E639 Nutritional deficiency, unspecified: Secondary | ICD-10-CM | POA: Diagnosis not present

## 2022-08-02 DIAGNOSIS — F422 Mixed obsessional thoughts and acts: Secondary | ICD-10-CM

## 2022-08-02 DIAGNOSIS — M26609 Unspecified temporomandibular joint disorder, unspecified side: Secondary | ICD-10-CM

## 2022-08-02 DIAGNOSIS — G9332 Myalgic encephalomyelitis/chronic fatigue syndrome: Secondary | ICD-10-CM

## 2022-08-02 DIAGNOSIS — F331 Major depressive disorder, recurrent, moderate: Secondary | ICD-10-CM

## 2022-08-02 DIAGNOSIS — Z634 Disappearance and death of family member: Secondary | ICD-10-CM | POA: Diagnosis not present

## 2022-08-02 DIAGNOSIS — F40232 Fear of other medical care: Secondary | ICD-10-CM

## 2022-08-02 DIAGNOSIS — G988 Other disorders of nervous system: Secondary | ICD-10-CM | POA: Diagnosis not present

## 2022-08-02 NOTE — Progress Notes (Signed)
Psychotherapy Progress Note [facility] Luan Moore, PhD LP  Patient ID: Adam Meyers)    MRN: [id number] Therapy format: Individual psychotherapy Date: 08/02/2022      Start: 3:10p     Stop: 3:55p     Time Spent: 45 min Location: In-person   Session narrative (presenting needs, interim history, self-report of stressors and symptoms, applications of prior therapy, status changes, and interventions made in session) TMJ continues to flare.  Ears ringing, soft foods, put off today's shave to keep from provoking himself too much to come today.  Stomach has been acting up, too.  Figured out too much peanut butter, is resolving as he has backed off.  Mornings always grits or oatmeal, supper always soup (often with a hamburger bun), and carb-y snacks (Goldfish, peanut butter crackers).  Continue to recommend he get more vegetables, and either use an immersion blender or dice finely to bypass chewing.  Finding himself feeling more like staying in bed, much of it due to pain (jaw, hip, or both).  Refreshed recommendation to include natural antiinflammatory supplements (turmeric, omega 3).  Morale helped by an after-Christmas gathering, blessed to be given a substantial gift card by two aunts   Feb 10 will be 1 year since mother passed.  Pangs of grief sometimes, usually on sighting a reminder of her.  Walkers and bedroom slippers that are still in sight tend to be evocative, and really want them out.  Working up to ask [employer] to haul away for donation.  Affirmed and encouraged getting evocative objects taken care of.  Therapeutic modalities: Cognitive Behavioral Therapy, Solution-Oriented/Positive Psychology, and Ego-Supportive  Mental Status/Observations:  Appearance:   Casual     Behavior:  Appropriate  Motor:  Normal  Speech/Language:   Clear and Coherent  Affect:  Appropriate  Mood:  anxious and dysthymic  Thought process:  normal  Thought content:    Obsessions   Sensory/Perceptual disturbances:    WNL  Orientation:  Fully oriented  Attention:  Good    Concentration:  Good  Memory:  WNL  Insight:    Good  Judgment:   Fair  Impulse Control:  Good   Risk Assessment: Danger to Self: No Self-injurious Behavior: No Danger to Others: No Physical Aggression / Violence: No Duty to Warn: No Access to Firearms a concern: No  Assessment of progress:  stabilized  Diagnosis:   ICD-10-CM   1. Major depressive disorder, recurrent episode, moderate (HCC)  F33.1     2. 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 Nutritional problems  E63.9     5. Chronic fatigue syndrome  G93.32     6. 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 Fear of other medical care  F40.232     8. Bereavement  Z63.4      Plan:  Mood/morale -- Try to start the day earlier, for mood and anxiety benefit.  Notice and dispute tendencies to collect troubles and agonize, try not to feed dread, just do what's reasonable.  Get any available help removing painful leftovers from mother's convalescence and better establishing the house for his own living out from under the shadow of grief. OCD/phobias -- Self-affirm any positive acts of courage and exposure to uncertainty.  Continue ad lib exposure to feared encounters and services, especially health care.  Dispute fearful thinking about changing providers -- could be as  good or better.  Maintain perspective "Just because it's loud doesn't mean it's true", the task is to let anxiety come and cope with it, not prevent all uneasiness, and find a "good enough" point.  Once the shock of new obsessive episodes passes, consider imaginal "do overs" for situations as approximate practice for new surprises. Medical needs -- Carry through all recommended foot and dental care.  Continue self-care for IBS-D and hemorrhoid procedure, get more active as able.  Follow through with planned  colonoscopy.  Diversify nutrition as tolerated, then keep pushing variety and vegetable sources again.  Add probiotic, yogurt make sense as delivery system.  Worth testing for deficiencies with PCP if not done already -- suspect D, B12 -- and consider magnesium for muscle tension and anxiety help.   Priority on TMJ care -- Look into topical antiinflammatory, use prior recommendation to warm compresses, experiment with gentle self-massage, and hopefully establish better muscle relaxant strategy.  For now, foods may need to be softer as well.  Most likely will need to dare to open his mouth widely enough to make a suitable bite guard.  Should seek specialty care. Medication management -- Given strong personal and family history of unusual medication reactions and the [facility]'s endorsement of genetic testing, recommend having it done through PCP or psychiatry. Disability -- Remains disabled from any occupation due to overall symptom severity Other recommendations/advice as may be noted above Continue to utilize previously learned skills ad lib Maintain medication as prescribed and work faithfully with relevant prescriber(s) if any changes are desired or seem indicated Call the clinic on-call service, 988/hotline, 911, or present to [facility] or ER if any life-threatening psychiatric crisis Return for as already scheduled. Already scheduled visit in this office 08/24/2022.  Blanchie Serve, PhD Luan Moore, PhD LP Clinical Psychologist, [facility] [address] [place] [employer] [place], [place] [address] [phone]

## 2022-08-10 ENCOUNTER — Ambulatory Visit (INDEPENDENT_AMBULATORY_CARE_PROVIDER_SITE_OTHER): Payer: Self-pay | Admitting: Psychiatry

## 2022-08-24 ENCOUNTER — Ambulatory Visit: Payer: Self-pay | Admitting: Psychiatry

## 2022-09-11 DIAGNOSIS — R634 Abnormal weight loss: Secondary | ICD-10-CM | POA: Diagnosis not present

## 2022-09-11 DIAGNOSIS — Z6828 Body mass index (BMI) 28.0-28.9, adult: Secondary | ICD-10-CM | POA: Diagnosis not present

## 2022-09-11 DIAGNOSIS — M25561 Pain in right knee: Secondary | ICD-10-CM | POA: Diagnosis not present

## 2022-09-14 ENCOUNTER — Ambulatory Visit: Payer: Self-pay | Admitting: Psychiatry

## 2022-09-21 ENCOUNTER — Ambulatory Visit (INDEPENDENT_AMBULATORY_CARE_PROVIDER_SITE_OTHER): Payer: Medicare HMO | Admitting: Psychiatry

## 2022-09-21 ENCOUNTER — Encounter: Payer: Self-pay | Admitting: Psychiatry

## 2022-09-21 DIAGNOSIS — F4010 Social phobia, unspecified: Secondary | ICD-10-CM

## 2022-09-21 DIAGNOSIS — F331 Major depressive disorder, recurrent, moderate: Secondary | ICD-10-CM

## 2022-09-21 DIAGNOSIS — F3341 Major depressive disorder, recurrent, in partial remission: Secondary | ICD-10-CM

## 2022-09-21 DIAGNOSIS — F422 Mixed obsessional thoughts and acts: Secondary | ICD-10-CM | POA: Diagnosis not present

## 2022-09-21 DIAGNOSIS — F5105 Insomnia due to other mental disorder: Secondary | ICD-10-CM

## 2022-09-21 DIAGNOSIS — Z636 Dependent relative needing care at home: Secondary | ICD-10-CM

## 2022-09-21 DIAGNOSIS — M26609 Unspecified temporomandibular joint disorder, unspecified side: Secondary | ICD-10-CM | POA: Diagnosis not present

## 2022-09-21 MED ORDER — DIAZEPAM 5 MG PO TABS: 5.0000 mg | ORAL_TABLET | Freq: Three times a day (TID) | ORAL | 3 refills | Status: DC

## 2022-09-21 MED ORDER — FLUVOXAMINE MALEATE 100 MG PO TABS: 100.0000 mg | ORAL_TABLET | Freq: Every day | ORAL | 1 refills | Status: DC

## 2022-09-21 NOTE — Progress Notes (Signed)
Adam Meyers PD:[id number] [birth year] 62 y.o.   Subjective:   Patient ID:  Adam Meyers is a 62 y.o. (DOB 09-30-1960) male.  Chief Complaint:  Chief Complaint  Patient presents with   Follow-up   Anxiety    Depression        Associated symptoms include fatigue and headaches.  Past medical history includes anxiety.   Anxiety Symptoms include nervous/anxious behavior. Patient reports no chest pain or shortness of breath.     Adam Meyers presents for follow-up of OCD and anxiety.  seen November 2020.  No meds were changed at the time.  He was taking Luvox 150 mg a day and diazepam 5 mg at night for sleep  08/13/19 appt with the following noted: Worrying over pending loss of power bc 28 yo mother with him.  Her dementia is progressing.  Has daily help now. Stress living with mother.  Hosp for a month 2020 with cardiac and stroke problems.  Minor stroke.  Caretaking mother  Better TMJ with diazepam at HS.  Fear of the dentist now.  Fear of other doctors also. Was taking diazepam 5 hs for sleep.  Then did ok without it and stopped it. Half the time forgets the am dose of fluvoxamine.  Asks what that might do.  Not sure if anxiety or OCD is worse.  Sometimes lethargic.  Able to get out of the house some now with help.  Some germ issues at the grocery store periodically if everything doesn't go just right. Plan: be CW med.  No changes  02/11/20 appt with the following noted: Only taking fluvoxamine 100 mg daily. Vaccinated July 8, but "pushed every button that I got." for OCD.  Hard to make decisions and second guessing himself.   Son Adam Meyers off to [place] and pt got stomach bug a couple of days later and now fatigued.  That also triggered anxiety.  Being in a hotel room triggered him also.   Plan rec fluvoxamine 150 for better control  07/14/2020 appt noted: Never followed through on increasing fluvoxamine to 150 mg daily.  Doesn't like meds.   HA more frequent lately and  today. Caretaking wearing him down.  Avoids dentist bc OCD.  Chronic anxiety.  Plan: Rec increase fluvoxamine but he complains grogginess and sex SE at higher dosage.  Be consistent with 150 mg daily.  He probably will not.  12/08/20 appt noted: Adam Meyers Retired. Concerns about bruised leg discussed. General health concerns.   Stayed on Luvox 100 daily since here and never increased. Chronic anxiety and some is somatic. Handled recent OCD trigger OK. Fear of dentist but needs to go. Tired all the time. Neighbors bothering sleep occasionally. Plan no med changes  03/10/21 appt noted: Got to dentist but took everything in me bc phobic.  Abcess and root canal.  July 13.  Then FU for normal exam and gum dz.  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 Asks about dental Rx for Flexeril and meloxicam, if it's OK Son at App and 82yo.   Patient reports stable mood and denies depressed or irritable moods.  Patient denies difficulty with sleep initiation or maintenance. 10-11 hours typical lately.  Likes to sleep a lot.  Low energy. Brain won't work with less. Strange dreams.  Denies appetite disturbance.  Patient reports that energy and motivation have been good.  Patient denies any difficulty with concentration.  Patient denies any suicidal ideation. Plan: Continue fluvoxamine 150 mg nightly and diazepam  5 mg nightly.  09/29/2021 appointment with the following noted: Continued fluvoxamine 150 mg and diazepam 5 HS. Adam Meyers passed away in Sep 04, 2022.  She had been helped by vitamin E and Aricept.  Aid found her.  She passed away in her sleep.   Didn't know what to do.  It was fairly "traumatic".  Sister came to the house.   This created more anxiety for about a month. Continues to be restriccted by anxiety.   Living alone and feels ok about it.   Plan: Be consistent with 150 mg daily.  He probably will not. He's stayed on 100 mg daily.  03/02/22 appt noted: [name]  Makes shaving  difficult.  Difficult to eat broad diet and losing weight. Just had PE.  Given Meloxicam. Tried muscle relxers.  05/24/22 appt noted: Stayed with same meds.   Disc his concerns bc pharmacy scared him about BZ.   Spooked him. Still having TMJ issues.  Still a lot of trouble with pain and difficulty shaving.   Never tried more diazepam than current 5 mg HS.  09/21/22 appt noted: Chronic TMJ and only shaves weekly. Tried diazepam some earlier in day for it and didn't seem to help. Meloxicam is harder on stomach. Maintains interest in [employer] basketball. Good and bad things.  A lot of fatigue.  Hard to get up in AM.  Avg  12 hour in bed incl 2 hour nap.  But up 3-4 times at night to urinate but quickly back to sleep. Knee pain.  Tues dental appt last week and went.   No other med changes since here.  Sensitive to loud noises.  Never bothered me before my meltdown.  Past Psychiatric Medication Trials: Paxil Sex SE  Afraid of Zoloft bc mother hallucinated on it. Luvox 200 cog SE  pindolol, propranolol,  diazepam Cyclobenzaprine SE cognitive taken once and never again   Review of Systems:  Review of Systems  Constitutional:  Positive for fatigue.  HENT:  Positive for dental problem and tinnitus. Negative for postnasal drip, rhinorrhea and sneezing.        Jaw pain ongoing  Respiratory:  Negative for shortness of breath.   Cardiovascular:  Negative for chest pain.  Musculoskeletal:  Positive for arthralgias, gait problem and neck pain.  Skin:  Positive for rash.  Neurological:  Positive for headaches. Negative for tremors and weakness.  Psychiatric/Behavioral:  The patient is nervous/anxious.     Medications: I have reviewed the patient's current medications.  Current Outpatient Medications  Medication Sig Dispense Refill   ascorbic acid (VITAMIN C) 500 MG tablet Take 500 mg by mouth daily.     Cholecalciferol (VITAMIN D) 50 MCG (2000 UT) CAPS Take by mouth.     famotidine (PEPCID)  20 MG tablet Take 20 mg by mouth daily as needed for heartburn or indigestion.     ibuprofen (ADVIL) 200 MG tablet Take 600 mg by mouth every 8 (eight) hours as needed for moderate pain.      meloxicam (MOBIC) 7.5 MG tablet Take 7.5 mg by mouth daily.     diazepam (VALIUM) 5 MG tablet Take 1 tablet (5 mg total) by mouth in the morning, at noon, and at bedtime. 90 tablet 3   fluvoxaMINE (LUVOX) 100 MG tablet Take 1 tablet (100 mg total) by mouth at bedtime. 90 tablet 1   No current facility-administered medications for this visit.    Medication Side Effects: None  Allergies:  Allergies  Allergen  Reactions   Ciprofloxacin    Cyclobenzaprine    Oxycodone-Acetaminophen Itching   Seasonal Ic [Cholestatin]     Drainage,itchy eyes   Gabapentin Anxiety   Percocet [Oxycodone-Acetaminophen] Itching and Anxiety    Past Medical History:  Diagnosis Date   Compressed cervical disc    Cough    Depression    Hyperlipidemia    Nasal congestion    Social anxiety disorder 04/10/2018    Family History  Problem Relation Age of Onset   Cancer Maternal Grandmother        melanoma    Social History   Socioeconomic History   Marital status: Married    Spouse name: Not on file   Number of children: Not on file   Years of education: Not on file   Highest education level: Not on file  Occupational History   Not on file  Tobacco Use   Smoking status: Never   Smokeless tobacco: Never  Substance and Sexual Activity   Alcohol use: No   Drug use: No   Sexual activity: Not on file  Other Topics Concern   Not on file  Social History Narrative   Not on file   Social Determinants of Health   Financial Resource Strain: Not on file  Food Insecurity: Not on file  Transportation Needs: Not on file  Physical Activity: Not on file  Stress: Not on file  Social Connections: Not on file  Intimate Partner Violence: Not on file    Past Medical History, Surgical history, Social history, and  Family history were reviewed and updated as appropriate.   Please see review of systems for further details on the patient's review from today.   Objective:   Physical Exam:  There were no vitals taken for this visit.  Physical Exam Constitutional:      General: He is not in acute distress. Musculoskeletal:        General: No deformity.  Neurological:     Mental Status: He is alert and oriented to person, place, and time.     Cranial Nerves: No dysarthria.     Coordination: Coordination normal.  Psychiatric:        Attention and Perception: Attention and perception normal. He does not perceive auditory or visual hallucinations.        Mood and Affect: Mood is anxious. Mood is not depressed. Affect is not labile, blunt or inappropriate.        Speech: Speech normal.        Behavior: Behavior normal. Behavior is cooperative.        Thought Content: Thought content normal. Thought content is not paranoid or delusional. Thought content does not include homicidal or suicidal ideation. Thought content does not include suicidal plan.        Cognition and Memory: Cognition and memory normal.        Judgment: Judgment normal.     Comments: Residual chronic OCD but no worse. It still interferes with function and QOL markedly. Chronically fearful of med change and even dosage changes Fair insight and judgment Talkative without pressure      Lab Review:  No results found for: "NA", "K", "CL", "CO2", "GLUCOSE", "BUN", "CREATININE", "CALCIUM", "PROT", "ALBUMIN", "AST", "ALT", "ALKPHOS", "BILITOT", "GFRNONAA", "GFRAA"  No results found for: "WBC", "RBC", "HGB", "HCT", "PLT", "MCV", "MCH", "MCHC", "RDW", "LYMPHSABS", "MONOABS", "EOSABS", "BASOSABS"  No results found for: "POCLITH", "LITHIUM"   No results found for: "PHENYTOIN", "PHENOBARB", "VALPROATE", "CBMZ"   .res Assessment: Plan:  Major depressive disorder, recurrent episode, moderate (HCC)  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 We discussed Yvone Neu has a long history of OCD and a history of depression as well and first came to our practice and April 2014.  He continues in therapy with Dr. Faylene Kurtz.  While he has made a lot of progress and is much less distressed by his OCD.  He remains disabled because of time requirements of the OCD and he is easily triggered into compulsive rituals with high levels of anxiety.  With his limited life demands and relative isolation he manages acceptably well.  He does not want to take take more SSRI medication because of sexual side effects are worse at higher dosages.  Supportive therapy dealing with stressors of mother's death and how that plays into fears of med changes.   Also CBT around facing dental phobia.   Last med change was very difficult for him so he doesn't want to pursue it.  Disc his questions around meds RX for dental problems.  Needed reassurance.     Continue meds.  Benefit from meds. Rec Cont it longterm bc high relapse risk.  Emphasized need for consistency.   Answered his questions about need for it longterm medication given severity of his sx historically.    Rec increase fluvoxamine but he complains grogginess and sex SE at higher dosage.  He's stayed on 100 mg daily. Extensive discussion of dx OCD and will likely need meds for OCD for the rest of his life DT disability of OCD.  Option change SSRI to  Prozac to reduce fatigue.  Rec it.  He's afraid of Zoloft bc mother had a bad time with it.  He's afraid of switching meds.  Disc general fear of med changes and how it is even effecting Tx of TMJ. Option gabapentin off label trial. Can go really low and slow with trial.  Can also help anxiety.  Continue diazepam 5 mg HS   Can use prn for dentist.  BC severity TMJ trial diazepam as muscle relaxer.  Educated about BZ and some fears about it .  Repeated instructions several times.    Disc SE. Can take extra to go to dentist. Rec trial for TMJ as noted before.  Less risk than alternatives and TMJ is highly troublesome.  But dont' drive if drowsy.  Disc dental phobia at length. We discussed the short-term risks associated with benzodiazepines including sedation and increased fall risk among others.  Discussed long-term side effect risk including dependence, potential withdrawal symptoms, and the potential eventual dose-related risk of dementia.  But recent studies from 2020 dispute this association between benzodiazepines and dementia risk. Newer studies in 2020 do not support an association with dementia.  Option Genesight testing    FU 3-21mos    Lynder Parents, MD, DFAPA   Please see After Visit Summary for patient specific instructions.  Future Appointments  Date Time Provider [place]  10/12/2022  3:00 PM Blanchie Serve, PhD CP-CP None  11/09/2022  3:00 PM Blanchie Serve, PhD CP-CP None  12/14/2022  3:00 PM Blanchie Serve, PhD CP-CP None    No orders of the defined types were placed in this encounter.     -------------------------------

## 2022-09-26 ENCOUNTER — Other Ambulatory Visit: Payer: Self-pay | Admitting: Psychiatry

## 2022-09-26 ENCOUNTER — Telehealth: Payer: Self-pay | Admitting: Psychiatry

## 2022-09-26 DIAGNOSIS — M26609 Unspecified temporomandibular joint disorder, unspecified side: Secondary | ICD-10-CM

## 2022-09-26 MED ORDER — GABAPENTIN 100 MG PO CAPS: ORAL_CAPSULE | ORAL | 1 refills | Status: DC

## 2022-09-26 NOTE — Telephone Encounter (Signed)
sent 

## 2022-09-26 NOTE — Telephone Encounter (Signed)
Patient said you would send in a trial of gabapentin. Your last note does mention an off label trial.

## 2022-09-26 NOTE — Telephone Encounter (Signed)
Pt called and said that when he was seen last week Dr. Clovis Pu said that he would send in gabapentin for him to try. The pharmacy is [facility] on [address]. Please send in script

## 2022-10-12 ENCOUNTER — Ambulatory Visit (INDEPENDENT_AMBULATORY_CARE_PROVIDER_SITE_OTHER): Payer: Medicare HMO | Admitting: Psychiatry

## 2022-10-12 DIAGNOSIS — F422 Mixed obsessional thoughts and acts: Secondary | ICD-10-CM

## 2022-10-12 DIAGNOSIS — M26609 Unspecified temporomandibular joint disorder, unspecified side: Secondary | ICD-10-CM | POA: Diagnosis not present

## 2022-10-12 DIAGNOSIS — G4721 Circadian rhythm sleep disorder, delayed sleep phase type: Secondary | ICD-10-CM

## 2022-10-12 DIAGNOSIS — F331 Major depressive disorder, recurrent, moderate: Secondary | ICD-10-CM | POA: Diagnosis not present

## 2022-10-12 DIAGNOSIS — F40232 Fear of other medical care: Secondary | ICD-10-CM | POA: Diagnosis not present

## 2022-10-12 DIAGNOSIS — G8929 Other chronic pain: Secondary | ICD-10-CM

## 2022-10-12 DIAGNOSIS — G9332 Myalgic encephalomyelitis/chronic fatigue syndrome: Secondary | ICD-10-CM | POA: Diagnosis not present

## 2022-10-12 DIAGNOSIS — F4010 Social phobia, unspecified: Secondary | ICD-10-CM

## 2022-10-12 DIAGNOSIS — M549 Dorsalgia, unspecified: Secondary | ICD-10-CM | POA: Diagnosis not present

## 2022-10-12 NOTE — Progress Notes (Signed)
Psychotherapy Progress Note [facility] [name], PhD LP  Patient ID: INDIO SAMP)    MRN: [id number] Therapy format: Individual psychotherapy Date: 10/12/2022      Start: 3:14p     Stop: 4:02p     Time Spent: 48 min Location: In-person   Session narrative (presenting needs, interim history, self-report of stressors and symptoms, applications of prior therapy, status changes, and interventions made in session) Back after 10 wks or so.  Still enduring TMJ.  [name]'s graduation Fri, May 10 -- 9am, which is very challenging, but he & [name] have lodging worked out with his own room.  Has alerted [name] to how he will need some standby comfort in case of anxiety/sensory overwhelm, agreed.  Quandary about having a family celebration locally a week after that.  For one, had thought [name] was going to send out family invitations to graduation (she said she would) but she has not.  Assured it's OK for him to let people know even if it upstages her invitations.  Better to get fair notice to loved ones.  Meanwhile, plans are to enlist S [name]'s help preparing.  Sounds agreeable, just more challenging physically and sensorially.  Admittedly bittersweet at this point.  Happy for [name], sad that he can't participate as he'd like to, and seeing the end of another era, with [name] planing to locate near his mother in [place].  TMJ continues to be oppressive at times.  Recommend he try OTC topical antiinflammatory like Voltaren or Aspercreme, provided physician dos not object, since swallowing pills is inherently more difficult.  Agrees, will try.  Therapeutic modalities: Cognitive Behavioral Therapy, Solution-Oriented/Positive Psychology, and Ego-Supportive  Mental Status/Observations:  Appearance:   Casual     Behavior:  Appropriate  Motor:  Normal  Speech/Language:   Clear and Coherent, with jaw tension  Affect:  Appropriate  Mood:  anxious and dysthymic  Thought process:   normal  Thought content:    Obsessions  Sensory/Perceptual disturbances:    WNL  Orientation:  Fully oriented  Attention:  Good    Concentration:  Good  Memory:  WNL  Insight:    Good  Judgment:   Good  Impulse Control:  Good   Risk Assessment: Danger to Self: No Self-injurious Behavior: No Danger to Others: No Physical Aggression / Violence: No Duty to Warn: No Access to Firearms a concern: No  Assessment of progress:  progressing  Diagnosis:   ICD-10-CM   1. 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 Major depressive disorder, recurrent episode, moderate (HCC)  F33.1     3. Social anxiety disorder  F40.10     4. Fear of other medical care  F40.232     5. 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 Chronic fatigue syndrome  G93.32     8. Chronic back pain, unspecified back location, unspecified back pain laterality  M54.9    G89.29      Plan:  Family concerns and transitions -- Trust word given from ex-wife, dig in and do what makes sense per his values to honor [name]'s achievement and be present.  PRN make agreements how to stay in touch. Mood/morale -- Try to start the day early enough, for mood and anxiety benefit.  Notice and dispute tendencies to collect troubles and agonize, and try not to feed dread, just do what's reasonable and  trust it to work to good.  If any painful remainders from mother's decline and death, seek any help needed removing items and better fitting the house for his own living, out from under the shadow of grief. OCD/phobias -- Self-affirm any positive acts of courage and exposure to uncertainty.  Continue ad lib exposure to feared encounters and services, especially health care.  Dispute fearful thinking about changing providers -- could be as good or better.  Maintain perspective "Just because it's loud doesn't mean it's true", the task is to let anxiety come and cope with it, not  prevent all uneasiness, and find a "good enough" point.  Once the shock of new obsessive episodes passes, consider imaginal "do overs" for situations as approximate practice for new surprises. Medical needs -- Carry through all recommended foot and dental care.  Continue self-care for IBS-D and hemorrhoid procedure, get more active as able.  Follow through with planned colonoscopy.  Diversify nutrition as tolerated, then keep pushing variety and vegetable sources again.  Add probiotic, yogurt make sense as delivery system.  Worth testing for deficiencies with PCP if not done already -- suspect D, B12 -- and consider magnesium for muscle tension and anxiety help.   TMJ/Pain -- Look into topical antiinflammatory.  Use prior recommendations to warm compresses, experiment with gentle self-massage, and hopefully establish better muscle relaxant strategy.  At need, stick to softer foods.  Most likely will need dare to open his mouth widely enough to make a suitable Rx bite guard, unless an OTC alternative can be done.  May need specialty care beyond dentist and ENT. Medication management -- Given strong personal and family history of unusual medication reactions and the [facility] system's endorsement of genetic testing, recommend having it done through PCP or psychiatry. Disability -- Remains disabled from any occupation due to overall symptom severity, both physical and psychiatric Other recommendations/advice as may be noted above Continue to utilize previously learned skills ad lib Maintain medication as prescribed and work faithfully with relevant prescriber(s) if any changes are desired or seem indicated Call the clinic on-call service, 988/hotline, 911, or present to [facility] or ER if any life-threatening psychiatric crisis Return for time as already scheduled, avail earlier @ PT's need. Already scheduled visit in this office 11/09/2022.  [name], PhD [name], PhD LP Clinical Psychologist, [facility] [address], [address] [place], [place] [address] [phone]

## 2022-10-30 ENCOUNTER — Other Ambulatory Visit: Payer: Self-pay | Admitting: Psychiatry

## 2022-10-30 DIAGNOSIS — F422 Mixed obsessional thoughts and acts: Secondary | ICD-10-CM

## 2022-10-30 DIAGNOSIS — F4010 Social phobia, unspecified: Secondary | ICD-10-CM

## 2022-10-30 DIAGNOSIS — F3341 Major depressive disorder, recurrent, in partial remission: Secondary | ICD-10-CM

## 2022-11-09 ENCOUNTER — Ambulatory Visit: Payer: Medicare HMO | Admitting: Psychiatry

## 2022-12-14 ENCOUNTER — Ambulatory Visit (INDEPENDENT_AMBULATORY_CARE_PROVIDER_SITE_OTHER): Payer: Medicare HMO | Admitting: Psychiatry

## 2022-12-14 DIAGNOSIS — M26609 Unspecified temporomandibular joint disorder, unspecified side: Secondary | ICD-10-CM | POA: Diagnosis not present

## 2022-12-14 DIAGNOSIS — E639 Nutritional deficiency, unspecified: Secondary | ICD-10-CM

## 2022-12-14 DIAGNOSIS — G988 Other disorders of nervous system: Secondary | ICD-10-CM

## 2022-12-14 DIAGNOSIS — F422 Mixed obsessional thoughts and acts: Secondary | ICD-10-CM | POA: Diagnosis not present

## 2022-12-14 DIAGNOSIS — F331 Major depressive disorder, recurrent, moderate: Secondary | ICD-10-CM | POA: Diagnosis not present

## 2022-12-14 DIAGNOSIS — G9332 Myalgic encephalomyelitis/chronic fatigue syndrome: Secondary | ICD-10-CM | POA: Diagnosis not present

## 2022-12-14 DIAGNOSIS — F4010 Social phobia, unspecified: Secondary | ICD-10-CM

## 2022-12-14 NOTE — Progress Notes (Signed)
Psychotherapy Progress Note [facility] [name], PhD LP  Patient ID: Adam Meyers)    MRN: [id number] Therapy format: Individual psychotherapy Date: 12/14/2022      Start: 3:14p     Stop: 4:04p     Time Spent: 50 min Location: In-person   Session narrative (presenting needs, interim history, self-report of stressors and symptoms, applications of prior therapy, status changes, and interventions made in session) 2 mos since last seen.  Delayed learning that his copay tripled and he needs to tighten the belt.    Adam Meyers graduated, and Adam Meyers went through myriad worries about coping with the trip, whether it was bathroom availability, or how to clear his ears with severe TMJ.  Sensory hypersensitivity was acting up, too, hard to tolerate even going the speed limit, but managed.  Did find that accommodations and seating and logistics for the graduation all worked out perfectly (vs. apprehension that he would feel too overwhelmed to participate), to his great relief.  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  Mother's estate work continues, though they have had to work through extra documentation.  Notably had his hands get clammy at the bank when it came time to sign.  Not accompanied by any sense of dread, just the finality of it, best we can identify, and an attack of doubt whether he'd done all the right things.    Has successfully lost 30+ lbs in the past number of months, largely for restricted eating, mainly soup.  TMJ remains an issue, but managing to eat more again, mainly soup, but some sandwiches now, flattened and cut, and some baked chips.  Better protein supply, including some Healthy Choice dinners.  Affirmed his creativity and specially daring to re-explore more diverse foods.  Didn't try  antiinflammatory ideas, primarily a turmeric supplement, after consulting Dr. Jennelle Human.  Apparently thought it was advice that it creates a bleeding risk to take it with Meloxicam and other NSAIDs, but the lesson seems to have been exaggerated.  Fact-checked with Dr. Jennelle Human during session, discovered there is really only negligible risk of GI bleed, much more caution combining tumeric with an anticoagulant, so reassured Adam Meyers it's OK, and still worthwhile to see if antiinflammatory nutrition (and topical relief, e.g., OTC Voltaren gel) could alleviate some TMJ and lighten the need for NSAIDs.  Agrees to try it.  Therapeutic modalities: Cognitive Behavioral Therapy, Solution-Oriented/Positive Psychology, Ego-Supportive, and Psycho-education/Bibliotherapy  Mental Status/Observations:  Appearance:   Casual and Neat     Behavior:  Appropriate  Motor:  Tense esp re TMJ  Speech/Language:   Clear and Coherent  Affect:  Appropriate  Mood:  anxious, dysthymic, and some better  Thought process:  normal  Thought content:    Obsessions  Sensory/Perceptual disturbances:    WNL  Orientation:  Fully oriented  Attention:  Good    Concentration:  Good  Memory:  WNL  Insight:    Good  Judgment:   Good  Impulse Control:  Good   Risk Assessment: Danger to Self: No Self-injurious Behavior: No Danger to Others: No Physical Aggression / Violence: No Duty to Warn: No Access to Firearms a concern: No  Assessment of progress:  progressing  Diagnosis:   ICD-10-CM   1. 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 Major  depressive disorder, recurrent episode, moderate (HCC)  F33.1     3. Social anxiety disorder  F40.10     4. 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 Chronic fatigue syndrome  G93.32     7. Nutritional problems  E63.9      Plan:  Mood/morale -- Try to start the day early enough, for mood  and anxiety benefit.  Notice and dispute tendencies to collect troubles and agonize, and try not to feed dread, just do what's reasonable and trust it to work to good.  If any painful remainders from mother's decline and death, seek any help needed removing items and better fitting the house for his own living, out from under the shadow of grief. OCD/phobias -- Self-affirm any positive acts of courage and exposure to uncertainty.  Continue ad lib exposure to feared encounters and services, especially health care.  Dispute fearful thinking about changing providers -- could be as good or better.  Maintain perspective "Just because it's loud doesn't mean it's true", the task is to let anxiety come and cope with it, not prevent all uneasiness, and find a "good enough" point.  Once the shock of new obsessive episodes passes, consider imaginal "do overs" for situations as approximate practice for new surprises. Medical needs -- Carry through all recommended foot and dental care.  Continue self-care for IBS-D and hemorrhoid procedure, get more active as able.  Follow through with planned colonoscopy.  Diversify nutrition as tolerated, then keep pushing variety and vegetable sources again.  Add probiotic, yogurt make sense as delivery system.  Worth testing for deficiencies with PCP if not done already -- suspect D, B12 -- and consider magnesium for muscle tension and anxiety help.   TMJ/Pain -- Look into topical and dietary antiinflammatory help, e.g., Voltaren gel and turmeric and/or omega 3 supplementation.  Prior recommendations to try warm compresses, gentle self-massage, and hopefully establish better muscle relaxant strategy.  At need, of course stick with softer foods but try to maintain variety and adequate protein.  Very likely will need dare to open his mouth widely enough to make a suitable Rx bite guard, unless a suitable premade bite guard can be found.  May need specialty care beyond dentist and  ENT. Medication management -- Given strong personal and family history of unusual medication reactions and the [facility] system's endorsement of genetic testing, recommend having it done through PCP or psychiatry.  Question of SSRI poop-out vs. anxiogenic and depressogenic effects of degraded diet, may need further psychiatric assessment. Disability -- Remains disabled from any occupation due to overall symptom severity, both physical and psychiatric Other recommendations/advice -- As may be noted above.  Continue to utilize previously learned skills ad lib. Medication compliance -- Maintain medication as prescribed and work faithfully with relevant prescriber(s) if any changes are desired or seem indicated. Crisis service -- Aware of call list and work-in appts.  Call the clinic on-call service, 988/hotline, 911, or present to [facility] or ER if any life-threatening psychiatric crisis. Followup -- Return for time as already scheduled.  Next scheduled visit with me 01/12/2023.  Next scheduled in this office 01/12/2023.  [name], PhD [name], PhD LP Clinical Psychologist, [facility] [address], [address] [place], [place] [address] [phone]

## 2023-01-12 ENCOUNTER — Ambulatory Visit (INDEPENDENT_AMBULATORY_CARE_PROVIDER_SITE_OTHER): Payer: Medicare HMO | Admitting: Psychiatry

## 2023-01-18 ENCOUNTER — Ambulatory Visit: Payer: Medicare HMO | Admitting: Psychiatry

## 2023-02-08 DIAGNOSIS — E559 Vitamin D deficiency, unspecified: Secondary | ICD-10-CM | POA: Diagnosis not present

## 2023-02-08 DIAGNOSIS — M2669 Other specified disorders of temporomandibular joint: Secondary | ICD-10-CM | POA: Diagnosis not present

## 2023-02-08 DIAGNOSIS — Z125 Encounter for screening for malignant neoplasm of prostate: Secondary | ICD-10-CM | POA: Diagnosis not present

## 2023-02-08 DIAGNOSIS — Z0000 Encounter for general adult medical examination without abnormal findings: Secondary | ICD-10-CM | POA: Diagnosis not present

## 2023-02-08 DIAGNOSIS — R109 Unspecified abdominal pain: Secondary | ICD-10-CM | POA: Diagnosis not present

## 2023-02-08 DIAGNOSIS — R5383 Other fatigue: Secondary | ICD-10-CM | POA: Diagnosis not present

## 2023-02-08 DIAGNOSIS — E782 Mixed hyperlipidemia: Secondary | ICD-10-CM | POA: Diagnosis not present

## 2023-02-08 DIAGNOSIS — B351 Tinea unguium: Secondary | ICD-10-CM | POA: Diagnosis not present

## 2023-02-09 ENCOUNTER — Ambulatory Visit: Payer: Medicare HMO | Admitting: Psychiatry

## 2023-03-02 ENCOUNTER — Ambulatory Visit (INDEPENDENT_AMBULATORY_CARE_PROVIDER_SITE_OTHER): Payer: Medicare HMO | Admitting: Psychiatry

## 2023-03-02 DIAGNOSIS — E639 Nutritional deficiency, unspecified: Secondary | ICD-10-CM | POA: Diagnosis not present

## 2023-03-02 DIAGNOSIS — F331 Major depressive disorder, recurrent, moderate: Secondary | ICD-10-CM

## 2023-03-02 DIAGNOSIS — G988 Other disorders of nervous system: Secondary | ICD-10-CM | POA: Diagnosis not present

## 2023-03-02 DIAGNOSIS — F40232 Fear of other medical care: Secondary | ICD-10-CM

## 2023-03-02 DIAGNOSIS — F422 Mixed obsessional thoughts and acts: Secondary | ICD-10-CM | POA: Diagnosis not present

## 2023-03-02 DIAGNOSIS — M26609 Unspecified temporomandibular joint disorder, unspecified side: Secondary | ICD-10-CM

## 2023-03-02 DIAGNOSIS — G4721 Circadian rhythm sleep disorder, delayed sleep phase type: Secondary | ICD-10-CM

## 2023-03-02 DIAGNOSIS — F4010 Social phobia, unspecified: Secondary | ICD-10-CM | POA: Diagnosis not present

## 2023-03-02 DIAGNOSIS — G9332 Myalgic encephalomyelitis/chronic fatigue syndrome: Secondary | ICD-10-CM | POA: Diagnosis not present
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
## 2023-03-16 DIAGNOSIS — R195 Other fecal abnormalities: Secondary | ICD-10-CM | POA: Diagnosis not present

## 2023-03-16 DIAGNOSIS — K4090 Unilateral inguinal hernia, without obstruction or gangrene, not specified as recurrent: Secondary | ICD-10-CM | POA: Diagnosis not present

## 2023-03-16 DIAGNOSIS — R109 Unspecified abdominal pain: Secondary | ICD-10-CM | POA: Diagnosis not present

## 2023-03-16 DIAGNOSIS — K429 Umbilical hernia without obstruction or gangrene: Secondary | ICD-10-CM | POA: Diagnosis not present

## 2023-03-16 DIAGNOSIS — N3289 Other specified disorders of bladder: Secondary | ICD-10-CM | POA: Diagnosis not present

## 2023-03-16 DIAGNOSIS — N400 Enlarged prostate without lower urinary tract symptoms: Secondary | ICD-10-CM | POA: Diagnosis not present

## 2023-03-24 ENCOUNTER — Other Ambulatory Visit: Payer: Self-pay | Admitting: Psychiatry

## 2023-03-24 DIAGNOSIS — F422 Mixed obsessional thoughts and acts: Secondary | ICD-10-CM

## 2023-03-24 DIAGNOSIS — M26609 Unspecified temporomandibular joint disorder, unspecified side: Secondary | ICD-10-CM

## 2023-03-24 DIAGNOSIS — Z636 Dependent relative needing care at home: Secondary | ICD-10-CM

## 2023-03-24 DIAGNOSIS — F4010 Social phobia, unspecified: Secondary | ICD-10-CM

## 2023-04-13 ENCOUNTER — Ambulatory Visit: Payer: Medicare HMO | Admitting: Psychiatry

## 2023-04-18 ENCOUNTER — Ambulatory Visit (INDEPENDENT_AMBULATORY_CARE_PROVIDER_SITE_OTHER): Payer: Medicare HMO | Admitting: Psychiatry

## 2023-04-18 ENCOUNTER — Encounter: Payer: Self-pay | Admitting: Psychiatry

## 2023-04-18 DIAGNOSIS — F331 Major depressive disorder, recurrent, moderate: Secondary | ICD-10-CM | POA: Diagnosis not present

## 2023-04-18 DIAGNOSIS — G988 Other disorders of nervous system: Secondary | ICD-10-CM

## 2023-04-18 DIAGNOSIS — F3341 Major depressive disorder, recurrent, in partial remission: Secondary | ICD-10-CM

## 2023-04-18 DIAGNOSIS — F4010 Social phobia, unspecified: Secondary | ICD-10-CM | POA: Diagnosis not present

## 2023-04-18 DIAGNOSIS — Z636 Dependent relative needing care at home: Secondary | ICD-10-CM | POA: Diagnosis not present

## 2023-04-18 DIAGNOSIS — M26609 Unspecified temporomandibular joint disorder, unspecified side: Secondary | ICD-10-CM | POA: Diagnosis not present

## 2023-04-18 DIAGNOSIS — G9332 Myalgic encephalomyelitis/chronic fatigue syndrome: Secondary | ICD-10-CM

## 2023-04-18 DIAGNOSIS — F422 Mixed obsessional thoughts and acts: Secondary | ICD-10-CM

## 2023-04-18 MED ORDER — DIAZEPAM 5 MG PO TABS
5.0000 mg | ORAL_TABLET | Freq: Three times a day (TID) | ORAL | 4 refills | Status: DC | PRN
Start: 2023-04-18 — End: 2023-11-22

## 2023-04-18 MED ORDER — PREGABALIN 25 MG PO CAPS: 25.0000 mg | ORAL_CAPSULE | Freq: Every evening | ORAL | 0 refills | Status: DC

## 2023-04-18 MED ORDER — FLUVOXAMINE MALEATE 100 MG PO TABS: 100.0000 mg | ORAL_TABLET | Freq: Every day | ORAL | 1 refills | Status: DC
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
## 2023-05-09 ENCOUNTER — Ambulatory Visit (INDEPENDENT_AMBULATORY_CARE_PROVIDER_SITE_OTHER): Payer: Medicare HMO | Admitting: Psychiatry

## 2023-05-09 DIAGNOSIS — E639 Nutritional deficiency, unspecified: Secondary | ICD-10-CM | POA: Diagnosis not present

## 2023-05-09 DIAGNOSIS — F422 Mixed obsessional thoughts and acts: Secondary | ICD-10-CM

## 2023-05-09 DIAGNOSIS — F40232 Fear of other medical care: Secondary | ICD-10-CM

## 2023-05-09 DIAGNOSIS — M26609 Unspecified temporomandibular joint disorder, unspecified side: Secondary | ICD-10-CM | POA: Diagnosis not present

## 2023-05-09 DIAGNOSIS — F331 Major depressive disorder, recurrent, moderate: Secondary | ICD-10-CM | POA: Diagnosis not present

## 2023-05-09 DIAGNOSIS — G988 Other disorders of nervous system: Secondary | ICD-10-CM | POA: Diagnosis not present

## 2023-05-09 DIAGNOSIS — G9332 Myalgic encephalomyelitis/chronic fatigue syndrome: Secondary | ICD-10-CM

## 2023-05-09 DIAGNOSIS — F4010 Social phobia, unspecified: Secondary | ICD-10-CM | POA: Diagnosis not present
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
## 2023-05-31 ENCOUNTER — Ambulatory Visit: Payer: Medicare HMO | Admitting: Psychiatry

## 2023-05-31 DIAGNOSIS — F422 Mixed obsessional thoughts and acts: Secondary | ICD-10-CM

## 2023-05-31 DIAGNOSIS — G9332 Myalgic encephalomyelitis/chronic fatigue syndrome: Secondary | ICD-10-CM | POA: Diagnosis not present

## 2023-05-31 DIAGNOSIS — E639 Nutritional deficiency, unspecified: Secondary | ICD-10-CM | POA: Diagnosis not present

## 2023-05-31 DIAGNOSIS — F331 Major depressive disorder, recurrent, moderate: Secondary | ICD-10-CM

## 2023-05-31 DIAGNOSIS — G988 Other disorders of nervous system: Secondary | ICD-10-CM | POA: Diagnosis not present

## 2023-05-31 DIAGNOSIS — F4010 Social phobia, unspecified: Secondary | ICD-10-CM | POA: Diagnosis not present

## 2023-05-31 NOTE — Progress Notes (Signed)
Psychotherapy Progress Note [facility] [name], PhD LP  Patient ID: Adam Meyers)    MRN: [id number] Therapy format: Individual psychotherapy Date: 05/31/2023      Start: 3:14p     Stop: 4:03p     Time Spent: 49 min Location: In-person   Session narrative (presenting needs, interim history, self-report of stressors and symptoms, applications of prior therapy, status changes, and interventions made in session) Thanksgiving went better than usual, fairly well on time, about 3 hrs worth of time with Adam Meyers and family.  Had some modest IBS afterward.  Has looked back at medical records from early November, recovered information what they were looking for.  By description, a good part of his problem may be more simply an overproduction of gas.  Has correlated oatmeal with it.  Largely eats frozen meals but has reduced bread and wheat somewhat.  Plans to start a Shaklee probiotic.  Endorsed efforts to work on diet as cause and treatment, re-incorporate more variety and probiotic content, and consider checking with GI about a possible FODMAP issue.  Apprehension building for Adam Meyers's [place] trip this weekend, where he will fly solo 15 hrs from [place] to [place].  Refreshed attempts to positively imagine his travel, including what Adam Meyers would do to meet imaginable challenges, and assurance that Adam Meyers will feel relieved after he returns and witness his boy more grown and competent, with stories to tell.  Therapeutic modalities: Cognitive Behavioral Therapy, Solution-Oriented/Positive Psychology, and Ego-Supportive  Mental Status/Observations:  Appearance:   Casual     Behavior:  Appropriate  Motor:  Normal  Speech/Language:   Clear and Coherent  Affect:  Appropriate  Mood:  anxious  Thought process:  normal  Thought content:    Obsessions  Sensory/Perceptual disturbances:    WNL  Orientation:  Fully oriented  Attention:  Good    Concentration:  Good  Memory:  WNL   Insight:    Variable  Judgment:   Good  Impulse Control:  Good   Risk Assessment: Danger to Self: No Self-injurious Behavior: No Danger to Others: No Physical Aggression / Violence: No Duty to Warn: No Access to Firearms a concern: No  Assessment of progress:  progressing  Diagnosis:   ICD-10-CM   1. 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 Major depressive disorder, recurrent episode, moderate (HCC)  F33.1     3. Chronic fatigue syndrome  G93.32     4. Social anxiety disorder  F40.10     5. 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 Nutritional problems  E63.9      Plan:  Mood/morale -- Try to start the day early enough, for mood and anxiety benefit, rather than allow long mornings in bed.  Notice and dispute tendencies to collect troubles and agonize, and try not to feed dread, just do what's reasonable and trust it to work to good.  If any painful remainders remain from mother's decline and death, seek any help needed removing items and refreshing home furnishings to fit his own living, out from under the shadow of grief. OCD/phobias exposure therapy -- Self-affirm any positive acts of courage and exposure to uncertainty.  Continue ad lib exposure to feared encounters and services, especially in health care.  Maintain perspective "Just because it's loud doesn't mean it's true", practice letting anxiety come and cope with it for some dedicated amount, not prevent all uneasiness, and seek a "good enough" point.  Once the shock of new obsessive episodes passes, consider imaginal "do overs" for situations as approximate practice for new surprises. Medical needs -- Carry through all recommended foot, dental, TMJ and other care.  Diversify nutrition as tolerated, and keep pushing variety and vegetable sources again.  Add probiotic, yogurt make sense as delivery system.  Worth testing for deficiencies with PCP if not already (suspect D, B12).  Consider  magnesium for muscle tension and anxiety help, NAC for anti-OCD benefit.   TMJ/Pain -- Try topical and dietary antiinflammatory help, e.g., Voltaren gel and turmeric and/or omega 3 supplementation as well as warm compresses, gentle self-massage, and hopefully establish better muscle relaxant strategy.  Endorse fact-finding about Botox.  At need, of course stick with softer foods but try to maintain variety and adequate protein.  Very likely will need dare open his mouth widely enough to make a suitable Rx bite guard, unless a suitable OTC bite guard can be found.  May need TMJ specialty care beyond dentist and ENT. Medication management -- Given strong personal and family history of unusual medication reactions and the [facility] system's endorsement of genetic testing, recommend having it done through PCP or psychiatry.  Question of SSRI poop-out vs. anxiogenic and depressogenic effects of degraded diet and sleep, may need further psychiatric assessment. Disability -- Remains disabled from any occupation due to overall symptom severity, both physical and psychiatric Other recommendations/advice -- As may be noted above.  Continue to utilize previously learned skills ad lib. Medication compliance -- Maintain medication as prescribed and work faithfully with relevant prescriber(s) if any changes are desired or seem indicated. Crisis service -- Aware of call list and work-in appts.  Call the clinic on-call service, 988/hotline, 911, or present to [facility] or ER if any life-threatening psychiatric crisis. Followup -- No follow-ups on file.  Next scheduled visit with me 07/06/2023.  Next scheduled in this office 07/06/2023.  [name], PhD [name], PhD LP Clinical Psychologist, [facility] [address] [place], [place] [address] [phone]

## 2023-07-06 ENCOUNTER — Ambulatory Visit: Payer: Medicare HMO | Admitting: Psychiatry

## 2023-07-06 DIAGNOSIS — Z91199 Patient's noncompliance with other medical treatment and regimen due to unspecified reason: Secondary | ICD-10-CM

## 2023-07-06 NOTE — Progress Notes (Addendum)
 Admin note for non-service contact  Patient ID: Adam Meyers  MRN: [id number] DATE: 07/06/2023  NS for 3pm.  [place] event in progress, initially presumed to be not attending for that reason, but quite unusual not to hear from Pt.  No charge assessed, friendly reminder sent to call for any change of expected plans.  Reply received 1/15, Pt notes having called on 1/7 and 1/8, left specific voicemail 1/8 that his son was visiting, message apparently missed or not followed thorugh by staff.  No penalty, RTO as scheduled.  Lamar Kendall, PhD [name], PhD LP Clinical Psychologist, [facility] [address], [address] [place], [place] [address] [phone]

## 2023-07-25 ENCOUNTER — Encounter: Payer: Self-pay | Admitting: Psychiatry

## 2023-07-25 ENCOUNTER — Ambulatory Visit (INDEPENDENT_AMBULATORY_CARE_PROVIDER_SITE_OTHER): Payer: Medicare HMO | Admitting: Psychiatry

## 2023-07-25 DIAGNOSIS — F4010 Social phobia, unspecified: Secondary | ICD-10-CM | POA: Diagnosis not present

## 2023-07-25 DIAGNOSIS — E639 Nutritional deficiency, unspecified: Secondary | ICD-10-CM | POA: Diagnosis not present

## 2023-07-25 DIAGNOSIS — F331 Major depressive disorder, recurrent, moderate: Secondary | ICD-10-CM

## 2023-07-25 DIAGNOSIS — G988 Other disorders of nervous system: Secondary | ICD-10-CM

## 2023-07-25 DIAGNOSIS — G9332 Myalgic encephalomyelitis/chronic fatigue syndrome: Secondary | ICD-10-CM

## 2023-07-25 DIAGNOSIS — F422 Mixed obsessional thoughts and acts: Secondary | ICD-10-CM
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
## 2023-08-02 ENCOUNTER — Ambulatory Visit: Payer: Medicare HMO | Admitting: Psychiatry

## 2023-08-02 DIAGNOSIS — M549 Dorsalgia, unspecified: Secondary | ICD-10-CM | POA: Diagnosis not present

## 2023-08-02 DIAGNOSIS — G988 Other disorders of nervous system: Secondary | ICD-10-CM | POA: Diagnosis not present

## 2023-08-02 DIAGNOSIS — F40232 Fear of other medical care: Secondary | ICD-10-CM

## 2023-08-02 DIAGNOSIS — F331 Major depressive disorder, recurrent, moderate: Secondary | ICD-10-CM | POA: Diagnosis not present

## 2023-08-02 DIAGNOSIS — E639 Nutritional deficiency, unspecified: Secondary | ICD-10-CM | POA: Diagnosis not present

## 2023-08-02 DIAGNOSIS — F4010 Social phobia, unspecified: Secondary | ICD-10-CM | POA: Diagnosis not present

## 2023-08-02 DIAGNOSIS — M26609 Unspecified temporomandibular joint disorder, unspecified side: Secondary | ICD-10-CM | POA: Diagnosis not present

## 2023-08-02 DIAGNOSIS — G8929 Other chronic pain: Secondary | ICD-10-CM

## 2023-08-02 DIAGNOSIS — G9332 Myalgic encephalomyelitis/chronic fatigue syndrome: Secondary | ICD-10-CM

## 2023-08-02 DIAGNOSIS — F422 Mixed obsessional thoughts and acts: Secondary | ICD-10-CM

## 2023-08-02 NOTE — Progress Notes (Signed)
 Psychotherapy Progress Note [facility] [name], PhD LP  Patient ID: Adam Meyers)    MRN: [id number] Therapy format: Individual psychotherapy Date: 08/02/2023      Start: 3:20p     Stop: 4:10p     Time Spent: 50 min Location: In-person   Session narrative (presenting needs, interim history, self-report of stressors and symptoms, applications of prior therapy, status changes, and interventions made in session) Apologetic for no show, explains he called beforehand about it being a weather event -- apparently message lost.  Assured no charge.  Questions about adrenal fatigue, following encounter with Dr. Geoffry last week.  Looking back to his working life, he experienced intense stress, plausibly went through it then.  Recalls feeling some absence of emotions, and very drained, routinely reacting to production pressures and ignorant callers who needed extra time and explanations to get their work understood.  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  Last year he was there, he made almost the crucial number of mistakes to go on notice, and a pattern of tardiness.  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  Validated and encouraged.  Went through a conflict with sister about property tax on the house since last seen -- issues about having to remind her it's a joint obligation, covering monies from a joint inheritance account, follows an unexpected repair cost.  Been applying lessons in how to talk with Adam Meyers, effectively the biblical injunction that a gentle word turns away wrath.  Coming up on 2 years since Adam Meyers's  death.  Some things still haven't been removed from the home, like walkers.    Adam Meyers was indeed impressive for what he pulled off traveling alone to [place], managing public transit, sightseeing on his own when friends were doing other things.  Shared what a marvel it was hearing from him about what all he navigated, at times more alone than expected, and how orderly and courteous everything was.  From OCD standpoint, does get affected, jittery sometimes, on exposure to perceived contamination, like moving the trash cans, but much better than past years.  Shaving still will wipe him out for a day, ostensibly due to pain and spasms in his jaw and facial muscles.  Apprehensive about upcoming dental cleaning.  Overall encouraged in handling shaving and other necessary behaviors that bring pain or anxiety, and in bringing more variety into his diet for nutrition's and lifestyle's sake.  Therapeutic modalities: Cognitive Behavioral Therapy, Solution-Oriented/Positive Psychology, Ego-Supportive, and Psycho-education/Bibliotherapy  Mental Status/Observations:  Appearance:   Casual     Behavior:  Appropriate  Motor:  Normal and exc stiff jaw (baseline)  Speech/Language:   Clear and Coherent  Affect:  Appropriate  Mood:  anxious and dysthymic  Thought process:  normal  Thought content:    Obsessions  Sensory/Perceptual disturbances:    WNL  Orientation:  Fully oriented  Attention:  Good    Concentration:  Good  Memory:  WNL  Insight:    Variable  Judgment:   Good  Impulse Control:  Good   Risk Assessment: Danger to Self: No Self-injurious Behavior: No Danger  to Others: No Physical Aggression / Violence: No Duty to Warn: No Access to Firearms a concern: No  Assessment of progress:  stabilized  Diagnosis:   ICD-10-CM   1. Major depressive disorder, recurrent episode, moderate (HCC)  F33.1     2. 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 Social  anxiety disorder  F40.10     4. Chronic fatigue syndrome  G93.32     5. 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 Nutritional problems  E63.9     8. Chronic back pain, unspecified back location, unspecified back pain laterality  M54.9    G89.29     9. 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  Notice and dispute tendencies to collect troubles and agonize, and try not to feed dread, just do what's reasonable and trust it to work to good.  If any painful remainders remain from mother's decline and death, seek any help needed removing items and refreshing home furnishings to fit his own living, out from under the shadow of grief. OCD/phobias exposure therapy -- Self-affirm any positive acts of courage and exposure to uncertainty.  Continue ad lib exposure to feared encounters and services, especially in health care.  Maintain perspective Just because it's loud doesn't mean it's true, practice letting anxiety come and cope with it for some dedicated amount, not prevent all uneasiness, and seek a good enough point.  Once the shock of new obsessive episodes passes, consider imaginal do overs for situations as approximate practice for new surprises. Medical needs -- Carry through all recommended foot, dental, TMJ and other care.  Diversify nutrition as tolerated, and keep pushing variety and vegetable sources again.  Add probiotic, yogurt make sense as delivery system.  Worth testing for deficiencies with PCP if not already (suspect D, B12).  Consider magnesium for muscle tension and anxiety help, NAC for anti-OCD benefit.   TMJ/Pain -- Try topical and dietary antiinflammatory help, e.g., Voltaren gel and turmeric and/or omega 3 supplementation as well as warm compresses, gentle self-massage, and hopefully establish better muscle relaxant  strategy.  Endorse fact-finding about Botox.  At need, of course stick with softer foods but try to maintain variety and adequate protein.  Very likely will need dare open his mouth widely enough to make a suitable Rx bite guard, unless a suitable OTC bite guard can be found.  May need TMJ specialty care beyond dentist and ENT. Medication management -- Given strong personal and family history of unusual medication reactions and the [facility] system's endorsement of genetic testing, recommend having it done through PCP or psychiatry.  Question of SSRI poop-out vs. anxiogenic and depressogenic effects of degraded diet and sleep, may need further psychiatric assessment. Disability -- Remains disabled from any occupation due to overall symptom severity, both physical and psychiatric Other recommendations/advice -- As may be noted above.  Continue to utilize previously learned skills ad lib. Medication compliance -- Maintain medication as prescribed and work faithfully with relevant prescriber(s) if any changes are desired or seem indicated. Crisis service -- Aware of call list and work-in appts.  Call the clinic on-call service, 988/hotline, 911, or present to [facility] or ER if any life-threatening psychiatric crisis. Followup -- Return for time as already  scheduled.  Next scheduled visit with me 08/30/2023.  Next scheduled in this office 08/30/2023.  Lamar Kendall, PhD [name], PhD LP Clinical Psychologist, [facility] [address] [place], [place] [address] [phone]

## 2023-08-30 ENCOUNTER — Ambulatory Visit (INDEPENDENT_AMBULATORY_CARE_PROVIDER_SITE_OTHER): Payer: Medicare HMO | Admitting: Psychiatry

## 2023-08-30 DIAGNOSIS — G8929 Other chronic pain: Secondary | ICD-10-CM | POA: Diagnosis not present

## 2023-08-30 DIAGNOSIS — G9332 Myalgic encephalomyelitis/chronic fatigue syndrome: Secondary | ICD-10-CM

## 2023-08-30 DIAGNOSIS — F331 Major depressive disorder, recurrent, moderate: Secondary | ICD-10-CM | POA: Diagnosis not present

## 2023-08-30 DIAGNOSIS — M549 Dorsalgia, unspecified: Secondary | ICD-10-CM | POA: Diagnosis not present

## 2023-08-30 DIAGNOSIS — F40232 Fear of other medical care: Secondary | ICD-10-CM

## 2023-08-30 DIAGNOSIS — G988 Other disorders of nervous system: Secondary | ICD-10-CM

## 2023-08-30 DIAGNOSIS — F422 Mixed obsessional thoughts and acts: Secondary | ICD-10-CM

## 2023-08-30 DIAGNOSIS — M26609 Unspecified temporomandibular joint disorder, unspecified side: Secondary | ICD-10-CM

## 2023-08-30 DIAGNOSIS — F4010 Social phobia, unspecified: Secondary | ICD-10-CM

## 2023-08-30 NOTE — Progress Notes (Signed)
 Psychotherapy Progress Note [facility] [name], PhD LP  Patient ID: Adam Meyers)    MRN: [id number] Therapy format: Individual psychotherapy Date: 08/30/2023      Start: 3:14p     Stop: 4:04p     Time Spent: 50 min Location: In-person   Session narrative (presenting needs, interim history, self-report of stressors and symptoms, applications of prior therapy, status changes, and interventions made in session) Went through challenging dental visit last week (tremor, pano xrays, and having to bite down painfully).  TMJ plus a root canal cause pain, plus a sensitive tooth.  On a q 3 month schedule since a deep cleaning.  Demonstrating resiliency by going and keeping to it.  Affirmed and encouraged in continuing, with more and more a perspective of acceptance without guarding or agonizing.  TMJ and triggers to jaw pain remain the most provocative aspect of it.  Needful because he cannot normally open his mouth wide enough to floss in back.  Notes that shaving triggers facial spasms.  Discussed possibility of using hand rather than facial muscles to stretch and position skin.  Discussed possibility of muscle relaxant for TMJ.  Mention of it brings up fear of Botox and it going wrong.  Validated and redirected re muscle relaxant -- says 2 prior trials he quit after 1 dose b/c he felt entirely too loopy the next day.  Attempted to explain that there are addressable reasons for that, including parasympathetic rebound (in which case get through a 2nd and 3rd night to see if it abates) and sensitive constitution or slow metabolizer (in which case reduce dose).  Unable to tolerate the conversation, allowed to move on.  Says he is Rx'd low dose Lyrica now but not yet taken it.  EHR shows Rx in October, says he shelved it for worrying about SE, agreed last med check to go ahead and try it, but still hasn't for fear of exacerbating gastric distress.  Encouraged to try it anyway, on  the hunch that his reaction is also psychosomatic, and he deserves to see past the fearful impression.  May have actual stomach irritation, however, given daily Ibuprofen (typically 2-3 per day).    Re. nutrition, is trying to push some limits, getting grapes again and another food or two.  Still heavily reliant on thin sliced [place] sandwiches, which are soft enough to chew without much provocation.  Brainstormed other foods, with a number of objections.  Encouraged explore ways of getting favorite tastes back, but chopped or otherwise softened so they don't require chewing.  Semi-resistant to problem-solving but allowed to explain ideas.  Still misses mother and can be struck with grief and/or shock encountering places in the house.  Glad to be past the anniversary of [name] death, though still affected.  Feb also the month BIL's son died of fentanyl, and the month of [name]'s 2nd miscarriage, which was acutely painful to both of them.  New hx not revealed before, agrees the first one hit hit about as terribly as it did her.  Support/validation provided.  With only brief time, led to imagine that he could visit with his mother, renewed in the afterlife, tell her how sorry he is and bothered by the way she died, and have her tell him how she is alright now, the discomfort was brief, and she wants him to not view her death as a tragedy, only as the way she passed from sick and mortal to well and with God.  Therapeutic modalities: Cognitive Behavioral Therapy, Solution-Oriented/Positive Psychology, Ego-Supportive, Psycho-education/Bibliotherapy, and practical, faith-sensitive  Mental Status/Observations:  Appearance:   Casual     Behavior:  Appropriate, with some resistance  Motor:  Normal  Speech/Language:   Clear and Coherent  Affect:  Appropriate and Constricted  Mood:  anxious and dysthymic  Thought process:  normal  Thought content:    Obsessions  Sensory/Perceptual disturbances:    WNL   Orientation:  Fully oriented  Attention:  Good    Concentration:  Fair  Memory:  WNL  Insight:    Good  Judgment:   Good  Impulse Control:  Good   Risk Assessment: Danger to Self: No Self-injurious Behavior: No Danger to Others: No Physical Aggression / Violence: No Duty to Warn: No Access to Firearms a concern: No  Assessment of progress:  progressing  Diagnosis:   ICD-10-CM   1. Major depressive disorder, recurrent episode, moderate (HCC)  F33.1     2. 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 Social anxiety disorder  F40.10     4. 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 Chronic fatigue syndrome  G93.32     6. 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 Chronic back pain, unspecified back location, unspecified back pain laterality  M54.9    G89.29     8. 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  Notice and dispute tendencies to collect troubles and agonize, and try not to feed dread, just do what's reasonable and trust it to work to good.  If any painful remainders remain from mother's decline and death, seek any help needed removing items and refreshing home furnishings to fit his own living, out from under the shadow of grief. OCD/phobias exposure therapy -- Self-affirm any positive acts of courage and exposure to uncertainty.  Continue ad lib exposure to feared encounters and services, especially in health care.  Maintain perspective toward intrusive fears and urges to avoid "Just because it's loud doesn't mean it's true".  Where able, practice letting anxiety come and cope with it for some dedicated amount of time or effort.  Actively oppose reflex urges to prevent all uneasiness, seek some "good enough" point for exposure, and self-affirm coping as opposed to seeing it as only being relieved to escape.  Consider  imaginal "do overs" for anxious situations weathered.  Medical needs -- Carry through all recommended foot, dental, TMJ and other care.  Diversify nutrition as tolerated, and keep pushing variety and vegetable sources again.  Where jaw pain is the problem, try grinding, dicing, smoothies, etc.  Add probiotic, yogurt make sense as delivery system.  Worth testing for deficiencies with PCP if not already (suspect D, B12).  Consider magnesium for muscle tension and anxiety help, NAC for anti-OCD benefit.   TMJ/Pain -- Try topical and dietary antiinflammatory help, e.g., Voltaren gel, turmeric, omega 3.  Also warm compresses, gentle self-massage, muscle relaxant, and/or muscle relaxation training.  Botox at option.  At need, of course stick with softer foods or modify as above.  Very likely will need to dare open his mouth widely enough to make a suitable bite guard.  May need TMJ specialty care beyond dentist and ENT. Medication management -- Given strong personal and family history of unusual medication reactions  and the [facility] system's endorsement of genetic testing, recommend having it done through PCP or psychiatry.  Question of SSRI poop-out vs. anxiogenic and depressogenic effects of degraded diet and sleep, may need further psychiatric assessment.  Consider that perceived SE of some meds may be in fact temporary reactions (e.g., parasympathetic rebound) and repeat doses or lowered doses should be tried before deciding. Disability -- Remains disabled from any occupation due to overall symptom severity, both physical and psychiatric Other recommendations/advice -- As may be noted above.  Continue to utilize previously learned skills ad lib. Medication compliance -- Maintain medication as prescribed and work faithfully with relevant prescriber(s) if any changes are desired or seem indicated. Crisis service -- Aware of call list and work-in appts.  Call the clinic on-call service, 988/hotline, 911, or present to  [facility] or ER if any life-threatening psychiatric crisis. Followup -- Return for time as already scheduled.  Next scheduled visit with me 09/27/2023.  Next scheduled in this office 09/27/2023.  [name], PhD [name], PhD LP Clinical Psychologist, [facility] [address], [address] [place], [place] [address] [phone]

## 2023-09-27 ENCOUNTER — Ambulatory Visit: Payer: Medicare HMO | Admitting: Psychiatry

## 2023-10-21 ENCOUNTER — Other Ambulatory Visit: Payer: Self-pay | Admitting: Psychiatry

## 2023-10-21 DIAGNOSIS — F422 Mixed obsessional thoughts and acts: Secondary | ICD-10-CM

## 2023-10-21 DIAGNOSIS — F3341 Major depressive disorder, recurrent, in partial remission: Secondary | ICD-10-CM

## 2023-10-21 DIAGNOSIS — F4010 Social phobia, unspecified: Secondary | ICD-10-CM

## 2023-11-01 ENCOUNTER — Ambulatory Visit: Payer: Medicare HMO | Admitting: Psychiatry

## 2023-11-22 ENCOUNTER — Encounter: Payer: Self-pay | Admitting: Psychiatry

## 2023-11-22 ENCOUNTER — Ambulatory Visit: Payer: Medicare HMO | Admitting: Psychiatry

## 2023-11-22 DIAGNOSIS — M26609 Unspecified temporomandibular joint disorder, unspecified side: Secondary | ICD-10-CM | POA: Diagnosis not present

## 2023-11-22 DIAGNOSIS — F331 Major depressive disorder, recurrent, moderate: Secondary | ICD-10-CM | POA: Diagnosis not present

## 2023-11-22 DIAGNOSIS — G988 Other disorders of nervous system: Secondary | ICD-10-CM

## 2023-11-22 DIAGNOSIS — F40232 Fear of other medical care: Secondary | ICD-10-CM

## 2023-11-22 DIAGNOSIS — M549 Dorsalgia, unspecified: Secondary | ICD-10-CM

## 2023-11-22 DIAGNOSIS — F422 Mixed obsessional thoughts and acts: Secondary | ICD-10-CM | POA: Diagnosis not present

## 2023-11-22 DIAGNOSIS — F3341 Major depressive disorder, recurrent, in partial remission: Secondary | ICD-10-CM | POA: Diagnosis not present

## 2023-11-22 DIAGNOSIS — Z636 Dependent relative needing care at home: Secondary | ICD-10-CM

## 2023-11-22 DIAGNOSIS — G9332 Myalgic encephalomyelitis/chronic fatigue syndrome: Secondary | ICD-10-CM

## 2023-11-22 DIAGNOSIS — F4010 Social phobia, unspecified: Secondary | ICD-10-CM

## 2023-11-22 DIAGNOSIS — G8929 Other chronic pain: Secondary | ICD-10-CM

## 2023-11-22 MED ORDER — DIAZEPAM 5 MG PO TABS: 5.0000 mg | ORAL_TABLET | Freq: Three times a day (TID) | ORAL | 4 refills | Status: DC | PRN

## 2023-11-22 MED ORDER — FLUVOXAMINE MALEATE 100 MG PO TABS: 100.0000 mg | ORAL_TABLET | Freq: Every day | ORAL | 1 refills | Status: DC
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
## 2023-12-06 ENCOUNTER — Ambulatory Visit (INDEPENDENT_AMBULATORY_CARE_PROVIDER_SITE_OTHER): Admitting: Psychiatry

## 2024-01-24 ENCOUNTER — Ambulatory Visit (INDEPENDENT_AMBULATORY_CARE_PROVIDER_SITE_OTHER): Admitting: Psychiatry

## 2024-01-24 DIAGNOSIS — F331 Major depressive disorder, recurrent, moderate: Secondary | ICD-10-CM | POA: Diagnosis not present

## 2024-01-24 DIAGNOSIS — F4010 Social phobia, unspecified: Secondary | ICD-10-CM | POA: Diagnosis not present

## 2024-01-24 DIAGNOSIS — G9332 Myalgic encephalomyelitis/chronic fatigue syndrome: Secondary | ICD-10-CM

## 2024-01-24 DIAGNOSIS — M26609 Unspecified temporomandibular joint disorder, unspecified side: Secondary | ICD-10-CM

## 2024-01-24 DIAGNOSIS — F422 Mixed obsessional thoughts and acts: Secondary | ICD-10-CM | POA: Diagnosis not present

## 2024-01-24 DIAGNOSIS — G988 Other disorders of nervous system: Secondary | ICD-10-CM

## 2024-01-24 DIAGNOSIS — E639 Nutritional deficiency, unspecified: Secondary | ICD-10-CM

## 2024-01-24 DIAGNOSIS — G4721 Circadian rhythm sleep disorder, delayed sleep phase type: Secondary | ICD-10-CM | POA: Diagnosis not present

## 2024-01-24 NOTE — Progress Notes (Signed)
 Psychotherapy Progress Note [facility] [name], PhD LP  Patient ID: Adam Meyers)    MRN: [id number] Therapy format: Individual psychotherapy Date: 01/24/2024      Start: 3:17p     Stop: 4:07p     Time Spent: 50 min Location: In-person   Session narrative (presenting needs, interim history, self-report of stressors and symptoms, applications of prior therapy, status changes, and interventions made in session) Last seen 5 months ago.  Cancellations in April, May, and June for different reasons.  Been through the serendipity grinder lately, esp with A/C going out in hot weather (freon low, 63yo system) and, together with known aged heating system, it was clear to do a full replace.  Went through on and off air cooling for a week with a couple brutal days stuck in front of a fan for minimal comfort.  Appetite down, largely fasted during the heat, then got diarrhea going back on more solid food.  Developed a new taste aversion to [place] and chicken sandwiches, which had been a staple.  Hopes to reintroduce them soon, but has felt a distinct aversion since diarrhea, much like previous episode of GI illness that led him to restrict his range of foods for a few years.  Feels insane to him, given his prior love of them, but accepts feedback that gustatory conditioning is notoriously quick and deep, and he seems to be more prone to it than most.  The challenge is the same, to wade back in with foods that approximate until he tolerates them.  Pleasantly surprised to see sister foot half the bill for HVAC replacement without objection, while he had to finance his half.  Financing itself takes a certain courage, as he has long had a visceral aversion to owing people money.  Another act of courage to give personal information for credit.  Both took a couple weeks for the anxiety to subside, and they can still pop up talking about it.  Cleaning the ductwork became recommended,  which meant further courageous exposure letting workmen in the house and into his bedroom, touching whatever they would have to.  Also became a point of potential conflict with sister [name], a she and Chyrl initially said fine, they'd pay half the bill on that, too then got home and later called him back to say she thought she shouldn't have to, since he was the only who would be benefitting from the air quality improvement.  While that is arguable -- it could be considered another investment in the home's value to them as co-owners -- [name] readily decided not to fight that one, practicing benevolence and diplomacy in the process that have eluded him in more stressed times.  All told, the HVAC episode has meant he needed a week of sleeping till 1pm to recuperate from the fatigue/letdown effect of engaging anxiety and conflict.  Affirmed and encouraged, though he is tempted to still only look at it all as surviving and relief form stress.  Reiterated prior point to give himself credit for coping before closing the book and not just characterize it as something survived.  The agency involved in owning the act of coping is important to resilience and overcoming OCD and other anxiety disorders.  Refreshed theory that he has an actual, inflammatory reaction to tension and exertion a la fibromyalgia.  He describes also having an unsignalled, nonanxious to begin with, jittery reaction at the car wash and a couple other moments.  From description,  sounds like he's been having hypoglycemic reactions which are consistent with a carb heavy diet and high anxiety reactions, which together can drive reactive hyperinsulinemia.  Encouraged again to reallocate calories toward protein and fat and less total carb load wherever possible, and if needed to enact carb timing.    Overall, kudos for coping, encouraged to decisively take on further exercise in letting other people come into protected spaces and branching out his own food  tastes to combat food phobias.  Therapeutic modalities: Cognitive Behavioral Therapy, Solution-Oriented/Positive Psychology, and Ego-Supportive  Mental Status/Observations:  Appearance:   Casual     Behavior:  Appropriate  Motor:  Normal  Speech/Language:   Clear and Coherent  Affect:  Appropriate  Mood:  anxious and managed  Thought process:  normal  Thought content:    WNL  Sensory/Perceptual disturbances:    WNL  Orientation:  Fully oriented  Attention:  Good    Concentration:  Good  Memory:  WNL  Insight:    Fair  Judgment:   Good  Impulse Control:  Good   Risk Assessment: Danger to Self: No Self-injurious Behavior: No Danger to Others: No Physical Aggression / Violence: No Duty to Warn: No Access to Firearms a concern: No  Assessment of progress:  progressing  Diagnosis:   ICD-10-CM   1. Major depressive disorder, recurrent episode, moderate (HCC)  F33.1     2. 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 Social anxiety disorder  F40.10     4. 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 Chronic fatigue syndrome  G93.32     6. 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 Nutritional problems  E63.9    incl conditioned food aversions    8. 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  Notice and dispute tendencies to collect troubles and agonize, and try not to feed dread, just do what's reasonable and trust it to work to good.  If any painful remainders remain from mother's decline and death, seek any help needed removing items and refreshing home furnishings to fit his own living, out from under the shadow of grief. OCD/phobias exposure therapy -- Self-affirm any positive acts of courage and exposure to uncertainty.  Continue ad lib exposure to feared encounters and services, especially in health care.  Maintain perspective  toward intrusive fears and urges to avoid Just because it's loud doesn't mean it's true.  Where able, practice letting anxiety come and cope with it for some dedicated amount of time or effort.  Actively oppose reflex urges to prevent all uneasiness, seek some good enough point for exposure, and self-affirm coping as opposed to seeing it as only being relieved to escape.  Consider imaginal do overs for anxious situations weathered.  Medical needs -- Carry through all recommended foot, dental, TMJ and other care.  Diversify nutrition as tolerated, and keep pushing variety and vegetable sources again.  Where jaw pain is the problem, try grinding, dicing, smoothies, etc.  Add probiotic, yogurt make sense as delivery system.  Worth testing for deficiencies with PCP if not already (suspect D, B12).  Consider magnesium for muscle tension and anxiety help, NAC for anti-OCD benefit.   TMJ/Pain -- Try topical and dietary antiinflammatory help, e.g., Voltaren gel, turmeric, omega 3.  Also warm  compresses, gentle self-massage, muscle relaxant, and/or muscle relaxation training.  Botox at option.  At need, of course stick with softer foods or modify as above.  Very likely will need to dare open his mouth widely enough to make a suitable bite guard.  May need TMJ specialty care beyond dentist and ENT. Medication management -- Given strong personal and family history of unusual medication reactions and the [facility] system's endorsement of genetic testing, recommend having it done through PCP or psychiatry.  Question of SSRI poop-out vs. anxiogenic and depressogenic effects of degraded diet and sleep, may need further psychiatric assessment.  Consider that perceived SE of some meds may be in fact temporary reactions (e.g., parasympathetic rebound) and repeat doses or lowered doses should be tried before deciding. Disability -- Remains disabled from any occupation due to overall symptom severity, both physical and  psychiatric Other recommendations/advice -- As may be noted above.  Continue to utilize previously learned skills ad lib. Medication compliance -- Maintain medication as prescribed and work faithfully with relevant prescriber(s) if any changes are desired or seem indicated. Crisis service -- Aware of call list and work-in appts.  Call the clinic on-call service, 988/hotline, 911, or present to [facility] or ER if any life-threatening psychiatric crisis. Followup -- Return for time as already scheduled.  Next scheduled visit with me 02/21/2024.  Next scheduled in this office 02/21/2024.  Lamar Kendall, PhD [name], PhD LP Clinical Psychologist, [facility] [address], [address] [place], [place] [address] [phone]

## 2024-02-14 DIAGNOSIS — J309 Allergic rhinitis, unspecified: Secondary | ICD-10-CM | POA: Diagnosis not present

## 2024-02-14 DIAGNOSIS — E782 Mixed hyperlipidemia: Secondary | ICD-10-CM | POA: Diagnosis not present

## 2024-02-14 DIAGNOSIS — Z6827 Body mass index (BMI) 27.0-27.9, adult: Secondary | ICD-10-CM | POA: Diagnosis not present

## 2024-02-14 DIAGNOSIS — Z0000 Encounter for general adult medical examination without abnormal findings: Secondary | ICD-10-CM | POA: Diagnosis not present

## 2024-02-14 DIAGNOSIS — Z1211 Encounter for screening for malignant neoplasm of colon: Secondary | ICD-10-CM | POA: Diagnosis not present

## 2024-02-14 DIAGNOSIS — Z125 Encounter for screening for malignant neoplasm of prostate: Secondary | ICD-10-CM | POA: Diagnosis not present

## 2024-02-14 DIAGNOSIS — R5383 Other fatigue: Secondary | ICD-10-CM | POA: Diagnosis not present

## 2024-02-14 DIAGNOSIS — E559 Vitamin D deficiency, unspecified: Secondary | ICD-10-CM | POA: Diagnosis not present

## 2024-02-21 ENCOUNTER — Ambulatory Visit: Admitting: Psychiatry

## 2024-03-20 ENCOUNTER — Ambulatory Visit (INDEPENDENT_AMBULATORY_CARE_PROVIDER_SITE_OTHER): Admitting: Psychiatry

## 2024-03-28 ENCOUNTER — Ambulatory Visit: Admitting: Psychiatry

## 2024-04-02 DIAGNOSIS — K921 Melena: Secondary | ICD-10-CM | POA: Diagnosis not present

## 2024-04-02 DIAGNOSIS — K6289 Other specified diseases of anus and rectum: Secondary | ICD-10-CM | POA: Diagnosis not present

## 2024-04-02 DIAGNOSIS — K5900 Constipation, unspecified: Secondary | ICD-10-CM | POA: Diagnosis not present

## 2024-04-02 DIAGNOSIS — Z83719 Family history of colon polyps, unspecified: Secondary | ICD-10-CM | POA: Diagnosis not present

## 2024-04-02 DIAGNOSIS — F419 Anxiety disorder, unspecified: Secondary | ICD-10-CM | POA: Diagnosis not present

## 2024-04-02 DIAGNOSIS — K649 Unspecified hemorrhoids: Secondary | ICD-10-CM | POA: Diagnosis not present

## 2024-04-28 DIAGNOSIS — Z1211 Encounter for screening for malignant neoplasm of colon: Secondary | ICD-10-CM | POA: Diagnosis not present

## 2024-04-28 DIAGNOSIS — K648 Other hemorrhoids: Secondary | ICD-10-CM | POA: Diagnosis not present

## 2024-04-28 DIAGNOSIS — D122 Benign neoplasm of ascending colon: Secondary | ICD-10-CM | POA: Diagnosis not present

## 2024-04-28 DIAGNOSIS — K6389 Other specified diseases of intestine: Secondary | ICD-10-CM | POA: Diagnosis not present

## 2024-04-30 DIAGNOSIS — D122 Benign neoplasm of ascending colon: Secondary | ICD-10-CM | POA: Diagnosis not present

## 2024-05-01 ENCOUNTER — Ambulatory Visit (INDEPENDENT_AMBULATORY_CARE_PROVIDER_SITE_OTHER): Admitting: Psychiatry

## 2024-05-01 DIAGNOSIS — F4010 Social phobia, unspecified: Secondary | ICD-10-CM

## 2024-05-01 DIAGNOSIS — G4721 Circadian rhythm sleep disorder, delayed sleep phase type: Secondary | ICD-10-CM

## 2024-05-01 DIAGNOSIS — F40232 Fear of other medical care: Secondary | ICD-10-CM

## 2024-05-01 DIAGNOSIS — M26609 Unspecified temporomandibular joint disorder, unspecified side: Secondary | ICD-10-CM

## 2024-05-01 DIAGNOSIS — G988 Other disorders of nervous system: Secondary | ICD-10-CM

## 2024-05-01 DIAGNOSIS — E639 Nutritional deficiency, unspecified: Secondary | ICD-10-CM | POA: Diagnosis not present

## 2024-05-01 DIAGNOSIS — F331 Major depressive disorder, recurrent, moderate: Secondary | ICD-10-CM | POA: Diagnosis not present

## 2024-05-01 DIAGNOSIS — F422 Mixed obsessional thoughts and acts: Secondary | ICD-10-CM

## 2024-05-01 DIAGNOSIS — G9332 Myalgic encephalomyelitis/chronic fatigue syndrome: Secondary | ICD-10-CM

## 2024-05-01 NOTE — Progress Notes (Signed)
 Psychotherapy Progress Note [facility] [name], PhD LP  Patient ID: Adam Meyers)    MRN: [id number] Therapy format: Individual psychotherapy Date: 05/01/2024      Start: 3:08p     Stop: 3:54p     Time Spent: 46 min Location: Telehealth visit -- I connected with this patient by an approved telecommunication method (audio only), with his informed consent, and verifying identity and patient privacy.  I was located at my office and patient at his home.  As needed, we discussed the limitations, risks, and security and privacy concerns associated with telehealth service, including the availability and conditions which currently govern in-person appointments and the possibility that 3rd-party payment may not be fully guaranteed and he may be responsible for charges.  After he indicated understanding, we proceeded with the session.  Also discussed treatment planning, as needed, including ongoing verbal agreement with the plan, the opportunity to ask and answer all questions, his demonstrated understanding of instructions, and his readiness to call the office should symptoms worsen or he feels he is in a crisis state and needs more immediate and tangible assistance.   Session narrative (presenting needs, interim history, self-report of stressors and symptoms, applications of prior therapy, status changes, and interventions made in session) Wanted to come in person, in order to make in-person declaration, but did not feel up to the trip, converted to phone.  Had colonoscopy Monday 11/3.  10/8 preop, but was having a hemorrhoidal discharge, with fouls smell, too self-conscious to come in person last time.  Went through prolonged anticipatory anxiety and insomnia, not to mention pointed pain sometimes enough to wake up.  Has found it shock to his system to go through.  Another important element that [name] went with him to his initial GI appt.  Faced the possibility of a rectal  exam, and had a panic attack of some magnitude.  Was able to to tell the PA (male) If you do this, you will never see me again and thankfully she was able to omit digital exam and make clinical decisions.  Among them was prescription suppositories, which he was able to do 1 night, with great effort and dedication, but not the other 6.  Found he slept 12 hrs after the initial ordeal.  Was shaking badly enough to break two suppositories getting them out of the package.  Affected most by the perceived grossness of it, even with gloves, could not muster it.  Was bothered by his discharges, by wearing Depends, and by apprehension about the colonoscopy procedure.  Prep was done by laxative pills, which was not uncomplicated -- 1st pill snagged sideways in his throat, thankfully recalled a technique for getting it to go down, and it turned into a successful exposure exercise -- seeing himself get the first 12 pills down the one night, found that the next morning's dose was much easier.  Fought through what was effectively posttraumatic anxiety from his first colonoscopy meltdown, and intrusive thoughts that his stuff would contaminate others.  Able to get through changing clothes, and urinate early, and other elements.  Asked if the experience is more doable now for having gotten through it, gives a pretty certain yes', although he would not want to have to.  Affirmed successful exposure and reinforced how the only way to fail to benefit from successful exposure is to talk himself in to further dread and aversion.  Notes that he dedicated himself to do the unwanted for the sake  of [name] -- would hate to think his cowardice meant shorting [name] of time with him in life, based on family history of polyps.  Did have 1 polyp, not concerning.  Has never defecated so much or so easily before, actually wishes it was easier to go on a normal basis.    Affirmed and encouraged, and validated how faith served him -- the  conviction he felt, the peace that passes, and even something like a message -- Don't ask for help and back out of it.  Encouraged in self-care, restoring his diet, returning to branching back out in variety of foods, and returning to mobility to get to appointments and other engagements so he can resume challenging depression, OCD, and social anxiety.    Therapeutic modalities: Cognitive Behavioral Therapy, Solution-Oriented/Positive Psychology, Environmental Manager, and Faith-sensitive  Mental Status/Observations:  Appearance:   Not assessed     Behavior:  Appropriate  Motor:  Not assessed  Speech/Language:   Clear and Coherent  Affect:  Not assessed  Mood:  anxious  Thought process:  normal  Thought content:    WNL and Obsessions  Sensory/Perceptual disturbances:    WNL  Orientation:  Fully oriented  Attention:  Good    Concentration:  Good  Memory:  WNL  Insight:    Good  Judgment:   Good  Impulse Control:  Good   Risk Assessment: Danger to Self: No Self-injurious Behavior: No Danger to Others: No Physical Aggression / Violence: No Duty to Warn: No Access to Firearms a concern: No  Assessment of progress:  stabilized  Diagnosis:   ICD-10-CM   1. Major depressive disorder, recurrent episode, moderate (HCC)  F33.1     2. 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 Social anxiety disorder  F40.10     4. 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 Chronic fatigue syndrome  G93.32     6. 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 Nutritional problems  E63.9     8. 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  Notice and dispute tendencies to collect troubles and agonize, and try not to feed dread, just do what's reasonable and trust it to work to good.  If any painful remainders  remain from mother's decline and death, seek any help needed removing items and refreshing home furnishings to fit his own living, out from under the shadow of grief. OCD/phobias exposure therapy -- Self-affirm any positive acts of courage and exposure to uncertainty.  Continue ad lib exposure to feared encounters and services, especially in health care.  Maintain perspective toward intrusive fears and urges to avoid Just because it's loud doesn't mean it's true.  Where able, practice letting anxiety come and cope with it for some dedicated amount of time or effort.  Actively oppose reflex urges to prevent all uneasiness, seek some good enough point for exposure, and self-affirm coping as opposed to seeing it as only being relieved to escape.  Consider imaginal do overs for anxious situations weathered.  Medical needs -- Carry through all recommended foot, dental, TMJ and other care.  Diversify nutrition as tolerated, and keep pushing variety and vegetable sources again.  Where jaw pain is the problem, try grinding,  dicing, smoothies, etc.  Add probiotic, yogurt make sense as delivery system.  Worth testing for deficiencies with PCP if not already (suspect D, B12).  Consider magnesium for muscle tension and anxiety help, NAC for anti-OCD benefit.   TMJ/Pain -- Try topical and dietary antiinflammatory help, e.g., Voltaren gel, turmeric, omega 3.  Also warm compresses, gentle self-massage, muscle relaxant, and/or muscle relaxation training.  Botox at option.  At need, of course stick with softer foods or modify as above.  Very likely will need to dare open his mouth widely enough to make a suitable bite guard.  May need TMJ specialty care beyond dentist and ENT. Medication management -- Given strong personal and family history of unusual medication reactions and the [facility] system's endorsement of genetic testing, recommend having it done through PCP or psychiatry.  Question of SSRI poop-out vs. anxiogenic and  depressogenic effects of degraded diet and sleep, may need further psychiatric assessment.  Consider that perceived SE of some meds may be in fact temporary reactions (e.g., parasympathetic rebound) and repeat doses or lowered doses should be tried before deciding. Disability -- Remains disabled from any occupation due to overall symptom severity, both physical and psychiatric Other recommendations/advice -- As may be noted above.  Continue to utilize previously learned skills ad lib. Medication compliance -- Maintain medication as prescribed and work faithfully with relevant prescriber(s) if any changes are desired or seem indicated. Crisis service -- Aware of call list and work-in appts.  Call the clinic on-call service, 988/hotline, 911, or present to [facility] or ER if any life-threatening psychiatric crisis. Followup -- Return for time as already scheduled.  Next scheduled visit with me 06/05/2024.  Next scheduled in this office 05/29/2024.  Lamar Kendall, PhD [name], PhD LP Clinical Psychologist, [facility] [address], [address] [place], [place] [address] [phone]

## 2024-05-29 ENCOUNTER — Ambulatory Visit: Admitting: Psychiatry

## 2024-05-29 ENCOUNTER — Encounter: Payer: Self-pay | Admitting: Psychiatry

## 2024-05-29 DIAGNOSIS — Z636 Dependent relative needing care at home: Secondary | ICD-10-CM

## 2024-05-29 DIAGNOSIS — F4010 Social phobia, unspecified: Secondary | ICD-10-CM

## 2024-05-29 DIAGNOSIS — F422 Mixed obsessional thoughts and acts: Secondary | ICD-10-CM | POA: Diagnosis not present

## 2024-05-29 DIAGNOSIS — F3341 Major depressive disorder, recurrent, in partial remission: Secondary | ICD-10-CM | POA: Diagnosis not present

## 2024-05-29 DIAGNOSIS — M26609 Unspecified temporomandibular joint disorder, unspecified side: Secondary | ICD-10-CM | POA: Diagnosis not present

## 2024-05-29 DIAGNOSIS — F331 Major depressive disorder, recurrent, moderate: Secondary | ICD-10-CM

## 2024-05-29 MED ORDER — FLUVOXAMINE MALEATE 100 MG PO TABS: 100.0000 mg | ORAL_TABLET | Freq: Every day | ORAL | 1 refills | Status: AC

## 2024-05-29 MED ORDER — DIAZEPAM 5 MG PO TABS: 5.0000 mg | ORAL_TABLET | Freq: Three times a day (TID) | ORAL | 5 refills | Status: AC | PRN
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
## 2024-05-30 DIAGNOSIS — M545 Low back pain, unspecified: Secondary | ICD-10-CM | POA: Insufficient documentation

## 2024-05-30 DIAGNOSIS — E559 Vitamin D deficiency, unspecified: Secondary | ICD-10-CM | POA: Insufficient documentation

## 2024-05-30 DIAGNOSIS — R899 Unspecified abnormal finding in specimens from other organs, systems and tissues: Secondary | ICD-10-CM | POA: Insufficient documentation

## 2024-05-30 DIAGNOSIS — E782 Mixed hyperlipidemia: Secondary | ICD-10-CM | POA: Insufficient documentation

## 2024-05-30 DIAGNOSIS — B351 Tinea unguium: Secondary | ICD-10-CM | POA: Insufficient documentation

## 2024-05-30 DIAGNOSIS — J309 Allergic rhinitis, unspecified: Secondary | ICD-10-CM | POA: Insufficient documentation

## 2024-05-30 DIAGNOSIS — F419 Anxiety disorder, unspecified: Secondary | ICD-10-CM | POA: Insufficient documentation

## 2024-06-02 ENCOUNTER — Encounter: Payer: Self-pay | Admitting: Cardiovascular Disease

## 2024-06-02 ENCOUNTER — Ambulatory Visit: Attending: Cardiovascular Disease | Admitting: Cardiovascular Disease

## 2024-06-02 ENCOUNTER — Ambulatory Visit

## 2024-06-02 ENCOUNTER — Other Ambulatory Visit: Payer: Self-pay | Admitting: Cardiovascular Disease

## 2024-06-02 VITALS — BP 128/78 | HR 65 | Ht 71.0 in | Wt 185.0 lb

## 2024-06-02 DIAGNOSIS — R002 Palpitations: Secondary | ICD-10-CM

## 2024-06-02 DIAGNOSIS — I499 Cardiac arrhythmia, unspecified: Secondary | ICD-10-CM

## 2024-06-02 NOTE — Progress Notes (Signed)
 Cardiology Office Note   Date:  06/02/2024  ID:  Adam Meyers, DOB Jan 15, 1961, MRN [id number] PCP: No primary care provider on file.  [facility] Providers Cardiologist:  None     History of Present Illness Adam Meyers is a 63 y.o. male who is being seen today for the evaluation of tachycardia at the request of Brahmbhatt, Layla, MD.  Adam Meyers is a 63 year old male who presents with concerns about heart rhythm abnormalities.  He experienced a rapid heart rate during a recent colonoscopy, with heart rates recorded at approximate 150 beats per minute. He does not feel the rapid heartbeat and was informed by the medical staff that it might be related to anxiety. No palpitations or awareness of his heart racing outside of this incident.  He does notice an occasional skip in his heartbeat if he purposely checks his carotid pulse.  He does not have exertional angina or dyspnea, but rarely engages in intense physical activity.  He spends most of the time in the house.  He is clearly deconditioned.  He denies syncope.  No lower extremity edema or orthopnea or PND.  He brought several monitoring strips to show me today.  They show normal sinus rhythm at the beginning and at the end of the monitoring.  And in between there is a regular narrow complex tachycardia at about 150 bpm.  Unfortunately the onset of the tachycardia and the termination of tachycardia are not captured in the strips.  He has a history of OCD and anxiety disorder for which he sees a psychiatrist. He is currently taking fluvoxamine  daily and diazepam  at night. He experiences significant fatigue, which he attributes to his psychiatric medication and a breakdown he had about ten years ago. The fatigue persists regardless of diazepam  use.  He mentions a family history of heart disease, with both parents having had heart conditions. His mother had atrial fibrillation (and was actually my patient until she passed away a  few years ago).  However his mother had had mitral valve replacement and had bioprosthetic mitral valve stenosis as an underlying cause for her arrhythmia.  He lives alone, having moved back into his mother's house after her passing in 2023. He previously lived with his son during his high school years after a divorce. He reports a sedentary lifestyle, spending most of his time indoors.    Studies Reviewed EKG Interpretation Date/Time:  Monday June 02 2024 15:14:35 EST Ventricular Rate:  65 PR Interval:  170 QRS Duration:  72 QT Interval:  388 QTC Calculation: 403 R Axis:   -36  Text Interpretation: Normal sinus rhythm Left axis deviation When compared with ECG of 12-Mar-2011 18:46, QRS axis Shifted left Nonspecific T wave abnormality now evident in Inferior leads Confirmed by Jacquees Gongora [phone]) on 06/02/2024 3:32:26 PM     Risk Assessment/Calculations           Physical Exam VS:  BP 128/78 (BP Location: Left Arm, Patient Position: Sitting, Cuff Size: Normal)   Pulse 65   Ht 5' 11 (1.803 m)   Wt 185 lb (83.9 kg)   SpO2 99%   BMI 25.80 kg/m        Wt Readings from Last 3 Encounters:  06/02/24 185 lb (83.9 kg)  08/21/11 198 lb (89.8 kg)    GEN: Well nourished, well developed in no acute distress NECK: No JVD; No carotid bruits CARDIAC: RRR, no murmurs, rubs, gallops RESPIRATORY:  Clear to auscultation without  rales, wheezing or rhonchi  ABDOMEN: Soft, non-tender, non-distended EXTREMITIES:  No edema; No deformity   ASSESSMENT AND PLAN  Intermittent episodes of rapid heart rate during colonoscopy, with heart rate reaching 150 bpm, with regular narrow complex tachycardia on rhythm strips. Differential includes sinus tachycardia versus SVT or atrial flutter. Sinus tachycardia is likely given the context of anxiety and lack of symptoms.  No symptoms of palpitations or dizziness reported. Ambulatory monitoring is necessary to capture potential arrhythmias, ordered  ambulatory arrhythmia monitor for two weeks. Will communicate with Dr. Elicia regarding the onset and termination of the tachycardia during the colonoscopy. If monitor shows normal rhythm, no further action is needed unless symptoms develop.  If true arrhythmia is identified we will proceed with at least an echocardiogram.       Dispo:  Patient Instructions  Medication Instructions:  No changes *If you need a refill on your cardiac medications before your next appointment, please call your pharmacy*  Lab Work: None ordered If you have labs (blood work) drawn today and your tests are completely normal, you will receive your results only by: MyChart Message (if you have MyChart) OR A paper copy in the mail If you have any lab test that is abnormal or we need to change your treatment, we will call you to review the results.  Testing/Procedures: Your physician has recommended that you wear a 14 DAY ZIO-PATCH monitor. The Zio patch cardiac monitor continuously records heart rhythm data for up to 14 days, this is for patients being evaluated for multiple types heart rhythms. For the first 24 hours post application, please avoid getting the Zio monitor wet in the shower or by excessive sweating during exercise. After that, feel free to carry on with regular activities. Keep soaps and lotions away from the ZIO XT Patch.  This will be mailed to you, please expect 7-10 days to receive.    Applying the monitor   Shave hair from upper left chest.   Hold abrader disc by orange tab.  Rub abrader in 40 strokes over left upper chest as indicated in your monitor instructions.   Clean area with 4 enclosed alcohol pads .  Use all pads to assure are is cleaned thoroughly.  Let dry.   Apply patch as indicated in monitor instructions.  Patch will be place under collarbone on left side of chest with arrow pointing upward.   Rub patch adhesive wings for 2 minutes.Remove white label marked 1.  Remove  white label marked 2.  Rub patch adhesive wings for 2 additional minutes.   While looking in a mirror, press and release button in center of patch.  A small green light will flash 3-4 times .  This will be your only indicator the monitor has been turned on.     Do not shower for the first 24 hours.  You may shower after the first 24 hours.   Press button if you feel a symptom. You will hear a small click.  Record Date, Time and Symptom in the Patient Log Book.   When you are ready to remove patch, follow instructions on last 2 pages of Patient Log Book.  Stick patch monitor onto last page of Patient Log Book.   Place Patient Log Book in [place] box.  Use locking tab on box and tape box closed securely.  The [employer] has [employer] on it.  Please place in mailbox as soon as possible.  Your physician should have your  test results approximately 7 days after the monitor has been mailed back to [facility].   Call [facility] Customer Care at [phone] if you have questions regarding your ZIO XT patch monitor.  Call them immediately if you see an orange light blinking on your monitor.   If your monitor falls off in less than 4 days contact our Monitor department at [phone].  If your monitor becomes loose or falls off after 4 days call [employer] at [phone] for suggestions on securing your monitor   Follow-Up: At [facility], you and your health needs are our priority.  As part of our continuing mission to provide you with exceptional heart care, our providers are all part of one team.  This team includes your primary Cardiologist (physician) and Advanced Practice Providers or APPs (Physician Assistants and Nurse Practitioners) who all work together to provide you with the care you need, when you need it.  Your next appointment:   Follow up as needed   Provider:   Dr Francyne  We recommend signing up for the patient portal called MyChart.  Sign up  information is provided on this After Visit Summary.  MyChart is used to connect with patients for Virtual Visits (Telemedicine).  Patients are able to view lab/test results, encounter notes, upcoming appointments, etc.  Non-urgent messages can be sent to your provider as well.   To learn more about what you can do with MyChart, go to [email or web address].      Signed, Jerel Francyne, MD

## 2024-06-02 NOTE — Patient Instructions (Signed)
 Medication Instructions:  No changes *If you need a refill on your cardiac medications before your next appointment, please call your pharmacy*  Lab Work: None ordered If you have labs (blood work) drawn today and your tests are completely normal, you will receive your results only by: MyChart Message (if you have MyChart) OR A paper copy in the mail If you have any lab test that is abnormal or we need to change your treatment, we will call you to review the results.  Testing/Procedures: Your physician has recommended that you wear a 14 DAY ZIO-PATCH monitor. The Zio patch cardiac monitor continuously records heart rhythm data for up to 14 days, this is for patients being evaluated for multiple types heart rhythms. For the first 24 hours post application, please avoid getting the Zio monitor wet in the shower or by excessive sweating during exercise. After that, feel free to carry on with regular activities. Keep soaps and lotions away from the ZIO XT Patch.  This will be mailed to you, please expect 7-10 days to receive.    Applying the monitor   Shave hair from upper left chest.   Hold abrader disc by orange tab.  Rub abrader in 40 strokes over left upper chest as indicated in your monitor instructions.   Clean area with 4 enclosed alcohol pads .  Use all pads to assure are is cleaned thoroughly.  Let dry.   Apply patch as indicated in monitor instructions.  Patch will be place under collarbone on left side of chest with arrow pointing upward.   Rub patch adhesive wings for 2 minutes.Remove white label marked 1.  Remove white label marked 2.  Rub patch adhesive wings for 2 additional minutes.   While looking in a mirror, press and release button in center of patch.  A small green light will flash 3-4 times .  This will be your only indicator the monitor has been turned on.     Do not shower for the first 24 hours.  You may shower after the first 24 hours.   Press button if you  feel a symptom. You will hear a small click.  Record Date, Time and Symptom in the Patient Log Book.   When you are ready to remove patch, follow instructions on last 2 pages of Patient Log Book.  Stick patch monitor onto last page of Patient Log Book.   Place Patient Log Book in [place] box.  Use locking tab on box and tape box closed securely.  The [employer] has [employer] on it.  Please place in mailbox as soon as possible.  Your physician should have your test results approximately 7 days after the monitor has been mailed back to [facility].   Call [facility] Customer Care at [phone] if you have questions regarding your ZIO XT patch monitor.  Call them immediately if you see an orange light blinking on your monitor.   If your monitor falls off in less than 4 days contact our Monitor department at [phone].  If your monitor becomes loose or falls off after 4 days call [employer] at [phone] for suggestions on securing your monitor   Follow-Up: At [facility], you and your health needs are our priority.  As part of our continuing mission to provide you with exceptional heart care, our providers are all part of one team.  This team includes your primary Cardiologist (physician) and Advanced Practice Providers or APPs (Physician Assistants and Nurse Practitioners)  who all work together to provide you with the care you need, when you need it.  Your next appointment:   Follow up as needed   Provider:   Dr Francyne  We recommend signing up for the patient portal called MyChart.  Sign up information is provided on this After Visit Summary.  MyChart is used to connect with patients for Virtual Visits (Telemedicine).  Patients are able to view lab/test results, encounter notes, upcoming appointments, etc.  Non-urgent messages can be sent to your provider as well.   To learn more about what you can do with MyChart, go to [email or web address].

## 2024-06-03 NOTE — Progress Notes (Unsigned)
 Enrolled for [employer] to mail a ZIO XT long term holter monitor to the patients address on file.

## 2024-06-05 ENCOUNTER — Ambulatory Visit: Admitting: Psychiatry

## 2024-06-05 DIAGNOSIS — Z629 Problem related to upbringing, unspecified: Secondary | ICD-10-CM

## 2024-06-05 DIAGNOSIS — F331 Major depressive disorder, recurrent, moderate: Secondary | ICD-10-CM

## 2024-06-05 DIAGNOSIS — F422 Mixed obsessional thoughts and acts: Secondary | ICD-10-CM

## 2024-06-05 DIAGNOSIS — G9332 Myalgic encephalomyelitis/chronic fatigue syndrome: Secondary | ICD-10-CM | POA: Diagnosis not present

## 2024-06-05 DIAGNOSIS — G988 Other disorders of nervous system: Secondary | ICD-10-CM | POA: Diagnosis not present

## 2024-06-05 NOTE — Progress Notes (Unsigned)
 Psychotherapy Progress Note [facility] Adam Kendall, PhD LP  Patient ID: Adam Meyers)    MRN: [id number] Therapy format: Individual psychotherapy Date: 06/05/2024      Start: 3:20p     Stop: 4:19p     Time Spent: 59 min Location: In-person   Session narrative (presenting needs, interim history, self-report of stressors and symptoms, applications of prior therapy, status changes, and interventions made in session) Had colonoscopy, 1 polyp taken.  Had racing heart at times even while under anesthesia, so he got referred to cardiology.  Appropriately interpreted as anxiety in context, but doing a 2-week monitor to be sure.  Anxiety-provoking to go, fearing he might have A fib like his M.  [name] is coming in tomorrow for a doctor's appt of her own.  Still has the ability to calm him down.    Nervous stomach today, cites the family cat Jinxy dying recently.  Had been a stray who showed up, and a family constant through disability, divorce, deaths of [name]'s parents, and a constant in [name]'s life since age 59.  He is distraught, ken seeking advice how to help him.  Shaken by the petition to bury the cat on his property, [name] not burying her deep enough (BIL corrected it), and the conceivable issue of wild animals violating the grave to his horror.  Clear in retrospect strong measures were taken to prevent predators/scavengers.  [name]'s experience was of stroking her all night, in his bed, and the cat essentially died in front of both [name] and [name], about 2:30am.  3 weeks ago when it happened.  Saw [name] cry hard when he saw him, knows he called out of work yesterday for distress.  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  Advised to validate wherever possible  Therapeutic modalities: {AM:23362::Cognitive Behavioral Therapy,Solution-Oriented/Positive Psychology}  Mental  Status/Observations:  Appearance:   {PSY:22683}     Behavior:  {PSY:21022743}  Motor:  {PSY:22302}  Speech/Language:   {PSY:22685}  Affect:  {PSY:22687}  Mood:  {PSY:31886}  Thought process:  {PSY:31888}  Thought content:    {PSY:[phone]}  Sensory/Perceptual disturbances:    {PSY:917-120-2091}  Orientation:  {Psych Orientation:23301::Fully oriented}  Attention:  {Good-Fair-Poor ratings:23770::Good}    Concentration:  {Good-Fair-Poor ratings:23770::Good}  Memory:  {[phone]}  Insight:    {Good-Fair-Poor ratings:23770::Good}  Judgment:   {Good-Fair-Poor ratings:23770::Good}  Impulse Control:  {Good-Fair-Poor ratings:23770::Good}   Risk Assessment: Danger to Self: {Risk:22599::No} Self-injurious Behavior: {Risk:22599::No} Danger to Others: {Risk:22599::No} Physical Aggression / Violence: {Risk:22599::No} Duty to Warn: {AMYesNo:22526::No} Access to Firearms a concern: {AMYesNo:22526::No}  Assessment of progress:  {Progress:22147::progressing}  Diagnosis: No diagnosis found. Plan:  *** Other recommendations/advice -- As may be noted above.  Continue to utilize previously learned skills ad lib. Medication compliance -- Maintain medication as prescribed and work faithfully with relevant prescriber(s) if any changes are desired or seem indicated. Crisis service -- Aware of call list and work-in appts.  Call the clinic on-call service, 988/hotline, 911, or present to [facility] or ER if any life-threatening psychiatric crisis. Followup -- No follow-ups on file.  Next scheduled visit with me 07/10/2024.  Next scheduled in this office 07/10/2024.  Adam Kendall, PhD Adam Kendall, PhD LP Clinical Psychologist, [facility] [address] [place], [place] [address] [phone]

## 2024-07-10 ENCOUNTER — Ambulatory Visit: Admitting: Psychiatry

## 2024-07-10 DIAGNOSIS — G9332 Myalgic encephalomyelitis/chronic fatigue syndrome: Secondary | ICD-10-CM

## 2024-07-10 DIAGNOSIS — G988 Other disorders of nervous system: Secondary | ICD-10-CM | POA: Diagnosis not present

## 2024-07-10 DIAGNOSIS — F4010 Social phobia, unspecified: Secondary | ICD-10-CM

## 2024-07-10 DIAGNOSIS — F422 Mixed obsessional thoughts and acts: Secondary | ICD-10-CM | POA: Diagnosis not present

## 2024-07-10 DIAGNOSIS — F331 Major depressive disorder, recurrent, moderate: Secondary | ICD-10-CM

## 2024-07-10 NOTE — Progress Notes (Signed)
 Psychotherapy Progress Note [facility] [name], PhD LP  Patient ID: Adam Meyers)    MRN: [id number] Therapy format: Individual psychotherapy Date: 07/10/2024      Start: 3:20p     Stop: 4:10p     Time Spent: 50 min Location: In-person   Session narrative (presenting needs, interim history, self-report of stressors and symptoms, applications of prior therapy, status changes, and interventions made in session) Near 20 min late, admittedly assumed too much, and hard to gauge time getting ready.  Main concern Adam Meyers, who is still distressed about the cat dying, having dreams.  Is trying diligently to let him feel it, not rush him off the subject or overly reassure.  From the sound of it, it is validating and helpful to Adam Meyers how he's meeting him.  Also noted how Adam Meyers has been validating back to him, something Adam Meyers had not thought to notice in the persistent worry to do enough for him.  Encouraged to recognize his young man and ASD child giving back emotionally, as more blessing to count and exercise in flexible perspective.  Christmas Eve involved an altered routine, and bittersweetness, hosting Adam Meyers and Adam Meyers overnight.  Eerie but soothing to have the family back together in one household.  Overheard Adam Meyers go outside to visit the cat's grave.  Waking up Christmas with the 3 who used to be one household years ago was wistful.  Astutely testified to Adam Meyers that Christmas won't always be shrouded like this one, and he got through his parents'  losses.  Sounds to have been an inspired, truly pastoral, deeply fatherly time to meet him.  Home property tax became a responsibility hot potato again with sister Adam Meyers, who balked at her half obligation.  Repeated logic that she is half owner while she repeated the issue that he lives there, plus BIL alleged that he alone benefits from the tax deduction.  Set them straight that he doesn't itemize, so if they don't take the  deduction it's wasted.  Support/validation provided, including that it can be worth reminding them to check facts before accusing, and the nonresident owner also gets free help preserving her investment by having him for a tenant, and he does cover some maintenance items alone.  Therapeutic modalities: Cognitive Behavioral Therapy, Solution-Oriented/Positive Psychology, and Ego-Supportive  Mental Status/Observations:  Appearance:   Casual     Behavior:  Appropriate  Motor:  Normal  Speech/Language:   Clear and Coherent  Affect:  Appropriate  Mood:  Less anxious  Thought process:  normal  Thought content:    WNL  Sensory/Perceptual disturbances:    WNL  Orientation:  Fully oriented  Attention:  Good    Concentration:  Good  Memory:  WNL  Insight:    Good  Judgment:   Good  Impulse Control:  Good   Risk Assessment: Danger to Self: No Self-injurious Behavior: No Danger to Others: No Physical Aggression / Violence: No Duty to Warn: No Access to Firearms a concern: No  Assessment of progress:  progressing  Diagnosis:   ICD-10-CM   1. Major depressive disorder, recurrent episode, moderate (HCC)  F33.1     2. 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 Social anxiety disorder  F40.10      Plan:  Mood/morale -- Try to start the day early enough, for mood  and anxiety benefit, rather than allow long mornings in bed.  Notice and dispute tendencies to collect troubles and agonize, and try not to feed dread, just do what's reasonable and trust it to work to good.  If any painful remainders remain from mother's decline and death, seek any help needed removing items and refreshing home furnishings to fit his own living, out from under the shadow of grief. OCD/phobias exposure therapy -- Self-affirm any positive acts of courage and exposure to uncertainty.  Continue ad lib  exposure to feared encounters and services, especially in health care.  Maintain perspective toward intrusive fears and urges to avoid Just because it's loud doesn't mean it's true.  Where able, practice letting anxiety come and cope with it for some dedicated amount of time or effort.  Actively oppose reflex urges to prevent all uneasiness, seek some good enough point for exposure, and self-affirm coping as opposed to seeing it as only being relieved to escape.  Consider imaginal do overs for anxious situations weathered.  Medical needs -- Carry through all recommended foot, dental, TMJ and other care.  Diversify nutrition as tolerated, and keep pushing variety and vegetable sources again.  Where jaw pain is the problem, try grinding, dicing, smoothies, etc.  Add probiotic, yogurt make sense as delivery system.  Worth testing for deficiencies with PCP if not already (suspect D, B12).  Consider magnesium for muscle tension and anxiety help, NAC for anti-OCD benefit.   TMJ/Pain -- Try topical and dietary antiinflammatory help, e.g., Voltaren gel, turmeric, omega 3.  Also warm compresses, gentle self-massage, muscle relaxant, and/or muscle relaxation training.  Botox at option.  At need, of course stick with softer foods or modify as above.  Very likely will need to dare open his mouth widely enough to make a suitable bite guard.  May need TMJ specialty care beyond dentist and ENT. Medication management -- Given strong personal and family history of unusual medication reactions and the [facility] system's endorsement of genetic testing, recommend having it done through PCP or psychiatry.  Question of SSRI poop-out vs. anxiogenic and depressogenic effects of degraded diet and sleep, may need further psychiatric assessment.  Consider that perceived SE of some meds may be in fact temporary reactions (e.g., parasympathetic rebound) and repeat doses or lowered doses should be tried before deciding. Disability --  Remains disabled from any occupation due to overall symptom severity, both physical and psychiatric Other recommendations/advice -- As may be noted above.  Continue to utilize previously learned skills ad lib. Medication compliance -- Maintain medication as prescribed and work faithfully with relevant prescriber(s) if any changes are desired or seem indicated. Crisis service -- Aware of call list and work-in appts.  Call the clinic on-call service, 988/hotline, 911, or present to [facility] or ER if any life-threatening psychiatric crisis. Followup -- Return for time as already scheduled.  Next scheduled visit with me 08/14/2024.  Next scheduled in this office 08/14/2024.  Adam Kendall, PhD [name], PhD LP Clinical Psychologist, [facility] [address], [address] [place], [place] [address] [phone]

## 2024-08-14 ENCOUNTER — Ambulatory Visit: Admitting: Psychiatry

## 2024-09-11 ENCOUNTER — Ambulatory Visit: Admitting: Psychiatry

## 2024-10-16 ENCOUNTER — Ambulatory Visit: Admitting: Psychiatry

## 2024-11-06 ENCOUNTER — Ambulatory Visit (INDEPENDENT_AMBULATORY_CARE_PROVIDER_SITE_OTHER): Admitting: Psychiatry

## 2024-11-13 ENCOUNTER — Ambulatory Visit: Admitting: Psychiatry
